# Patient Record
Sex: Female | Born: 1935 | Race: White | Hispanic: No | Marital: Single | Smoking: Former smoker
Health system: Southern US, Community
[De-identification: ages and names within clinical notes are randomized; demographics above are authoritative.]

## PROBLEM LIST (undated history)

## (undated) DIAGNOSIS — R32 Unspecified urinary incontinence: Secondary | ICD-10-CM

## (undated) DIAGNOSIS — M1990 Unspecified osteoarthritis, unspecified site: Secondary | ICD-10-CM

## (undated) DIAGNOSIS — G4700 Insomnia, unspecified: Secondary | ICD-10-CM

## (undated) DIAGNOSIS — J9610 Chronic respiratory failure, unspecified whether with hypoxia or hypercapnia: Secondary | ICD-10-CM

## (undated) DIAGNOSIS — G4762 Sleep related leg cramps: Secondary | ICD-10-CM

## (undated) DIAGNOSIS — J449 Chronic obstructive pulmonary disease, unspecified: Secondary | ICD-10-CM

## (undated) DIAGNOSIS — M6282 Rhabdomyolysis: Secondary | ICD-10-CM

## (undated) DIAGNOSIS — H409 Unspecified glaucoma: Secondary | ICD-10-CM

## (undated) DIAGNOSIS — J439 Emphysema, unspecified: Secondary | ICD-10-CM

## (undated) DIAGNOSIS — B019 Varicella without complication: Secondary | ICD-10-CM

## (undated) DIAGNOSIS — M549 Dorsalgia, unspecified: Secondary | ICD-10-CM

## (undated) HISTORY — DX: Unspecified osteoarthritis, unspecified site: M19.90

## (undated) HISTORY — DX: Emphysema, unspecified: J43.9

## (undated) HISTORY — DX: Rhabdomyolysis: M62.82

## (undated) HISTORY — DX: Unspecified urinary incontinence: R32

## (undated) HISTORY — DX: Sleep related leg cramps: G47.62

## (undated) HISTORY — DX: Unspecified glaucoma: H40.9

## (undated) HISTORY — DX: Insomnia, unspecified: G47.00

## (undated) HISTORY — DX: Varicella without complication: B01.9

## (undated) HISTORY — DX: Dorsalgia, unspecified: M54.9

## (undated) HISTORY — PX: COLOSTOMY: SHX63

## (undated) HISTORY — DX: Chronic obstructive pulmonary disease, unspecified: J44.9

---
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
## 1967-07-01 HISTORY — PX: BREAST SURGERY: SHX581

## 1973-06-30 HISTORY — PX: VAGINAL HYSTERECTOMY: SUR661

## 2004-05-04 ENCOUNTER — Emergency Department: Payer: Self-pay | Admitting: Emergency Medicine

## 2004-08-20 ENCOUNTER — Ambulatory Visit: Payer: Self-pay

## 2006-06-30 HISTORY — PX: HIP FRACTURE SURGERY: SHX118

## 2006-09-26 ENCOUNTER — Ambulatory Visit: Payer: Self-pay | Admitting: Family Medicine

## 2007-04-18 ENCOUNTER — Other Ambulatory Visit: Payer: Self-pay

## 2007-04-18 ENCOUNTER — Inpatient Hospital Stay: Payer: Self-pay | Admitting: Unknown Physician Specialty

## 2007-04-23 ENCOUNTER — Encounter: Payer: Self-pay | Admitting: Internal Medicine

## 2007-05-01 ENCOUNTER — Encounter: Payer: Self-pay | Admitting: Internal Medicine

## 2007-08-23 ENCOUNTER — Ambulatory Visit: Payer: Self-pay | Admitting: Pain Medicine

## 2007-08-31 ENCOUNTER — Ambulatory Visit: Payer: Self-pay | Admitting: Pain Medicine

## 2007-09-07 ENCOUNTER — Ambulatory Visit: Payer: Self-pay | Admitting: Pain Medicine

## 2007-09-20 ENCOUNTER — Ambulatory Visit: Payer: Self-pay | Admitting: Pain Medicine

## 2007-11-02 ENCOUNTER — Ambulatory Visit: Payer: Self-pay | Admitting: Pain Medicine

## 2007-11-29 ENCOUNTER — Ambulatory Visit: Payer: Self-pay | Admitting: Pain Medicine

## 2010-01-04 ENCOUNTER — Ambulatory Visit: Payer: Self-pay | Admitting: Family Medicine

## 2011-04-07 ENCOUNTER — Ambulatory Visit: Payer: Self-pay | Admitting: Family Medicine

## 2011-04-11 ENCOUNTER — Ambulatory Visit: Payer: Self-pay | Admitting: Family Medicine

## 2011-04-22 ENCOUNTER — Ambulatory Visit: Payer: Self-pay | Admitting: Surgery

## 2011-05-13 ENCOUNTER — Ambulatory Visit: Payer: Self-pay | Admitting: Unknown Physician Specialty

## 2011-05-20 ENCOUNTER — Ambulatory Visit: Payer: Self-pay | Admitting: Unknown Physician Specialty

## 2011-05-21 LAB — PATHOLOGY REPORT

## 2012-12-30 LAB — CBC AND DIFFERENTIAL
HEMATOCRIT: 41 % (ref 36–46)
HEMOGLOBIN: 13.5 g/dL (ref 12.0–16.0)
Neutrophils Absolute: 5 /uL
PLATELETS: 250 10*3/uL (ref 150–399)
WBC: 7.7 10*3/mL

## 2012-12-30 LAB — BASIC METABOLIC PANEL
BUN: 19 mg/dL (ref 4–21)
Creatinine: 0.7 mg/dL (ref 0.5–1.1)
GLUCOSE: 107 mg/dL
POTASSIUM: 4.3 mmol/L (ref 3.4–5.3)
Sodium: 142 mmol/L (ref 137–147)

## 2012-12-30 LAB — HEPATIC FUNCTION PANEL
ALT: 16 U/L (ref 7–35)
AST: 29 U/L (ref 13–35)
Alkaline Phosphatase: 75 U/L (ref 25–125)
BILIRUBIN, TOTAL: 0.3 mg/dL

## 2012-12-30 LAB — TSH: TSH: 3.21 u[IU]/mL (ref 0.41–5.90)

## 2013-03-23 ENCOUNTER — Inpatient Hospital Stay: Payer: Self-pay | Admitting: Internal Medicine

## 2013-03-23 LAB — CK TOTAL AND CKMB (NOT AT ARMC)
CK, Total: 117 U/L (ref 21–215)
CK-MB: 1.9 ng/mL (ref 0.5–3.6)

## 2013-03-23 LAB — BASIC METABOLIC PANEL
Anion Gap: 7 (ref 7–16)
BUN: 11 mg/dL (ref 7–18)
Chloride: 104 mmol/L (ref 98–107)
Co2: 25 mmol/L (ref 21–32)
Creatinine: 0.75 mg/dL (ref 0.60–1.30)
EGFR (African American): >60
EGFR (Non-African Amer.): >60
Glucose: 142 mg/dL — ABNORMAL HIGH (ref 65–99)
Potassium: 3.8 mmol/L (ref 3.5–5.1)

## 2013-03-23 LAB — CBC WITH DIFFERENTIAL/PLATELET
Basophil #: 0.1 10*3/uL (ref 0.0–0.1)
Eosinophil #: 0 10*3/uL (ref 0.0–0.7)
HCT: 41 % (ref 35.0–47.0)
HGB: 13.5 g/dL (ref 12.0–16.0)
MCHC: 32.9 g/dL (ref 32.0–36.0)
MCV: 92 fL (ref 80–100)
Monocyte %: 1.6 %
Neutrophil %: 93.1 %
Platelet: 272 10*3/uL (ref 150–440)
RDW: 14.4 % (ref 11.5–14.5)

## 2013-03-23 LAB — PRO B NATRIURETIC PEPTIDE: B-Type Natriuretic Peptide: 798 pg/mL — ABNORMAL HIGH (ref 0–450)

## 2013-03-23 LAB — TROPONIN I: Troponin-I: <0.02 ng/mL

## 2013-03-24 LAB — CBC WITH DIFFERENTIAL/PLATELET
Basophil #: 0 10*3/uL (ref 0.0–0.1)
Eosinophil #: 0 10*3/uL (ref 0.0–0.7)
HCT: 37.5 % (ref 35.0–47.0)
HGB: 12.8 g/dL (ref 12.0–16.0)
Lymphocyte #: 0.6 10*3/uL — ABNORMAL LOW (ref 1.0–3.6)
Lymphocyte %: 10.4 %
MCH: 30.7 pg (ref 26.0–34.0)
MCHC: 34 g/dL (ref 32.0–36.0)
MCV: 90 fL (ref 80–100)
Monocyte #: 0.2 x10 3/mm (ref 0.2–0.9)
Monocyte %: 3.4 %
Neutrophil %: 86.1 %
RDW: 14.3 % (ref 11.5–14.5)
WBC: 5.7 10*3/uL (ref 3.6–11.0)

## 2013-03-24 LAB — MAGNESIUM: Magnesium: 2 mg/dL

## 2013-03-28 LAB — CULTURE, BLOOD (SINGLE)

## 2013-11-07 ENCOUNTER — Institutional Professional Consult (permissible substitution): Payer: Self-pay | Admitting: Pulmonary Disease

## 2013-11-29 ENCOUNTER — Encounter: Payer: Self-pay | Admitting: Pulmonary Disease

## 2013-11-29 ENCOUNTER — Ambulatory Visit (INDEPENDENT_AMBULATORY_CARE_PROVIDER_SITE_OTHER): Payer: Commercial Managed Care - HMO | Admitting: Pulmonary Disease

## 2013-11-29 ENCOUNTER — Other Ambulatory Visit: Payer: Self-pay

## 2013-11-29 VITALS — BP 136/78 | HR 98 | Ht 62.0 in | Wt 122.0 lb

## 2013-11-29 DIAGNOSIS — R0602 Shortness of breath: Secondary | ICD-10-CM

## 2013-11-29 DIAGNOSIS — R0902 Hypoxemia: Secondary | ICD-10-CM

## 2013-11-29 DIAGNOSIS — J441 Chronic obstructive pulmonary disease with (acute) exacerbation: Secondary | ICD-10-CM

## 2013-11-29 DIAGNOSIS — J9611 Chronic respiratory failure with hypoxia: Secondary | ICD-10-CM | POA: Insufficient documentation

## 2013-11-29 DIAGNOSIS — J9610 Chronic respiratory failure, unspecified whether with hypoxia or hypercapnia: Secondary | ICD-10-CM

## 2013-11-29 DIAGNOSIS — J449 Chronic obstructive pulmonary disease, unspecified: Secondary | ICD-10-CM

## 2013-11-29 MED ORDER — ARFORMOTEROL TARTRATE 15 MCG/2ML IN NEBU: 15.0000 ug | INHALATION_SOLUTION | Freq: Two times a day (BID) | RESPIRATORY_TRACT | Status: DC

## 2013-11-29 NOTE — Assessment & Plan Note (Addendum)
COPD: GOLD Grade D Combined recommendations from the [employer], [employer], Designer, television/film set, European Respiratory Society (Qaseem A et al, Ann Intern Med. 2011;155(3):179) recommends tobacco cessation, pulmonary rehab (for symptomatic patients with an FEV1 < 50% predicted), supplemental oxygen (for patients with SaO2 <88% or paO2 <55), and appropriate bronchodilator therapy.  In regards to long acting bronchodilators, they recommend monotherapy (FEV1 60-80% with symptoms weak evidence, FEV1 with symptoms <60% strong evidence), or combination therapy (FEV1 <60% with symptoms, strong recommendation, moderate evidence).  One should also provide patients with annual immunizations and consider therapy for prevention of COPD exacerbations (ie. roflumilast or azithromycin) when appopriate.  -O2 therapy: advised to use 2L with exertion and qHS -Immunizations: Flu shot in fall -Tobacco use: quit 2003 -Exercise: pulmonary rehab referral -Bronchodilator therapy: We are limited by her glaucoma; will start with brovana alone, considering adding pulmicort depending on response to brovana -Exacerbation prevention: brovana, pulmonary rehab

## 2013-11-29 NOTE — Patient Instructions (Signed)
We will have [facility] bring out a nebulizer machine for you so you can take Brovana twice a day no matter how you feel Use your oxygen at 2L with exertion Use albuterol every four hours as needed We will refer you to pulmonary rehab at [facility] We will see you back in 2 months or sooner if needed

## 2013-11-29 NOTE — Progress Notes (Signed)
Subjective:    Patient ID: [name] [name], female    DOB: Jul 15, 1929, 80 y.o.   MRN: [id number]  HPI  This is a very pleasant 80 year old female who comes her clinic today for evaluation of COPD. She was recently diagnosed as COPD but she says she's been known that she had emphysema for several years. She smoked a pack a day of cigarettes for 50 years and quit in 2003. She has never had multiple episodes of bronchitis but she was admitted for COPD exacerbation earlier of this year. At that point she was discharged on Spiriva but she said that it didn't help her breathing and she was told that she should stay away from that and most inhalers because of her significant glaucoma. So because of that she's on been taking albuterol on a regular basis since then. She says that the albuterol will help her with shortness of breath but she continues to feel dyspneic with regular activities. She says that she can clean her house without too much trouble and she can carrying groceries but climbing a flight of stairs or pushing herself too fast will make her short of breath. She says that she has desire to go and do more but she is limited her activities because of her dyspnea. She does not cough she does not produce mucus. She has been staying in the house mostly because of the shortness of breath.  She was also prescribed oxygen not that long ago but she has not been using it regularly because she doesn't like it. She has been going around on room air and not using it at home. She's not sleeping on oxygen.  Past Medical History  Diagnosis Date  . Glaucoma   . COPD (chronic obstructive pulmonary disease)   . Nocturnal leg cramps   . Back pain   . Insomnia   . Osteoarthritis      Family History  Problem Relation Age of Onset  . Cancer Sister     cervical     History   Social History  . Marital Status: Single    Spouse Name: N/A    Number of Children: N/A  . Years of Education: N/A   Occupational  History  . Not on file.   Social History Main Topics  . Smoking status: Former Smoker -- 1.00 packs/day for 50 years    Types: Cigarettes    Quit date: 06/30/2001  . Smokeless tobacco: Never Used  . Alcohol Use: No  . Drug Use: No  . Sexual Activity: Not on file   Other Topics Concern  . Not on file   Social History Narrative  . No narrative on file     Allergies  Allergen Reactions  . Shellfish Allergy   . Codeine     headache     No outpatient prescriptions prior to visit.   No facility-administered medications prior to visit.      Review of Systems  Constitutional: Negative for fever and unexpected weight change.  HENT: Negative for congestion, dental problem, ear pain, nosebleeds, postnasal drip, rhinorrhea, sinus pressure, sneezing, sore throat and trouble swallowing.   Eyes: Negative for redness and itching.  Respiratory: Positive for shortness of breath and wheezing. Negative for cough and chest tightness.   Cardiovascular: Negative for palpitations and leg swelling.  Gastrointestinal: Negative for nausea and vomiting.  Genitourinary: Negative for dysuria.  Musculoskeletal: Negative for joint swelling.  Skin: Negative for rash.  Neurological: Negative for headaches.  Hematological: Does  not bruise/bleed easily.  Psychiatric/Behavioral: Negative for dysphoric mood. The patient is not nervous/anxious.        Objective:   Physical Exam Filed Vitals:   11/29/13 1354  BP: 136/78  Pulse: 98  Height: 5\' 2"  (1.575 m)  Weight: 122 lb (55.339 kg)  SpO2: 94%  RA at rest  Ambulated 500 feet on room air and oxygen saturation dropped to 81%  Gen: Thin, chronically ill-appearing, no acute distress HEENT: NCAT, PERRL, EOMi, OP clear, neck supple without masses PULM: Poor air movement, mild crackles right base CV: RRR, no mgr, no JVD AB: BS+, soft, nontender, no hsm Ext: warm, no edema, no clubbing, no cyanosis Derm: no rash or skin breakdown Neuro: A&Ox4,  CN II-XII intact, strength 5/5 in all 4 extremities  11/29/2013 simple spirometry ratio 40%, FEV1 0.45 L (26% predicted)     Assessment & Plan:   COPD (chronic obstructive pulmonary disease) COPD: GOLD Grade D Combined recommendations from the [employer], [employer], Designer, television/film set, European Respiratory Society (Qaseem A et al, Ann Intern Med. 2011;155(3):179) recommends tobacco cessation, pulmonary rehab (for symptomatic patients with an FEV1 < 50% predicted), supplemental oxygen (for patients with SaO2 <88% or paO2 <55), and appropriate bronchodilator therapy.  In regards to long acting bronchodilators, they recommend monotherapy (FEV1 60-80% with symptoms weak evidence, FEV1 with symptoms <60% strong evidence), or combination therapy (FEV1 <60% with symptoms, strong recommendation, moderate evidence).  One should also provide patients with annual immunizations and consider therapy for prevention of COPD exacerbations (ie. roflumilast or azithromycin) when appopriate.  -O2 therapy: advised to use 2L with exertion and qHS -Immunizations: Flu shot in fall -Tobacco use: quit 2003 -Exercise: pulmonary rehab referral -Bronchodilator therapy: We are limited by her glaucoma; will start with brovana alone, considering adding pulmicort depending on response to brovana -Exacerbation prevention: brovana, pulmonary rehab   Chronic hypoxemic respiratory failure Continue 2L with exertion, 2qHS  We'll need to get overnight oximetry testing    Updated Medication List Outpatient Encounter Prescriptions as of 11/29/2013  Medication Sig  . albuterol (PROVENTIL HFA;VENTOLIN HFA) 108 (90 BASE) MCG/ACT inhaler Inhale into the lungs every 6 (six) hours as needed for wheezing or shortness of breath.  . brinzolamide (AZOPT) 1 % ophthalmic suspension Place 1 drop into both eyes 3 (three) times daily.  . cholecalciferol (VITAMIN D) 1000 UNITS tablet Take  1,000 Units by mouth daily.  Marland Kitchen latanoprost (XALATAN) 0.005 % ophthalmic solution Place 1 drop into both eyes at bedtime.  . Multiple Vitamins-Minerals (CENTRUM SILVER ADULT 50+ PO) Take 1 tablet by mouth daily.

## 2013-11-29 NOTE — Assessment & Plan Note (Signed)
Continue 2L with exertion, 2qHS  We'll need to get overnight oximetry testing

## 2013-11-30 ENCOUNTER — Telehealth: Payer: Self-pay | Admitting: Pulmonary Disease

## 2013-11-30 NOTE — Telephone Encounter (Signed)
Spoke with BQ about this.  He states that [facility] is the only really option at this point.  Spoke with pt about this.  She is adamant about not working with [facility].  I advised her that this is our only real option aside from paying out of pocket for nebulizer and getting neb meds through local pharmacy, which is much more expensive.    Routing back to PCC's to see if they know of any local DME companies/pharmacies that we may have luck with.  She is planning on calling [facility] to have her 02 removed from her home, regardless of if we can find another company to cover this with her insurance.  Thank you.

## 2013-11-30 NOTE — Telephone Encounter (Signed)
Pt seen by BQ in [place] on 6.2.15 Orders sent to [facility] for portable/QHS oxygen and nebs thru [facility] per pt's specific request Called spoke with patient who is upset because [facility] has contacted her and brought her neb supplies to her home - she is refusing to receive care thru [facility] Per pt's chart, PCC's sent the order to [facility] d/t pt's [facility].  Pt became more upset stating that she will forego nebulizer and oxygen therapy if she has to get them thru this DME company.  Advised pt will forward this to BQ and see if there is another DME that will accept her insurance.  Spoke with Almyra Free who recommended to call [facility] and see if there is anyway they can work with patient.  Called [facility] and spoke with Iris.  Per Valli Glance is the only DME company contracted with [employer].  Iris did suggest pt getting her nebs thru her local pharmacy but this will be much more expensive for pt.  Dr Kendrick Fries please advise, thank you.

## 2013-11-30 NOTE — Telephone Encounter (Signed)
Spoke with the pharmacist at [facility] and gave dx code [id number]  Nothing further needed

## 2013-12-01 NOTE — Telephone Encounter (Signed)
After speaking to pt she seems to be struggling with the 02 when moving around she states it does not seem to be helping her,like not getting enough this seems to be the issue more so than the dme  What would you like to do ?

## 2013-12-07 NOTE — Telephone Encounter (Signed)
Pt called back-she is aware of recs from BQ listed below and states she is using her O2 and that [facility] just delivered her neb machine as well. Pt is okay using [facility] as DME. Rx should be shipped to patient from [facility] pharmacy-if she has any trouble she will contact our office. Nothing more needed at this time.

## 2013-12-07 NOTE — Telephone Encounter (Signed)
lmomtcb x1 

## 2013-12-07 NOTE — Telephone Encounter (Signed)
She is dyspnic because she has COPD and needs oxygen. Just because she wears oxygen doesn't mean she is not going to be short of breath.  If she is worse she should be seen sooner than we advised last visit

## 2013-12-13 ENCOUNTER — Telehealth: Payer: Self-pay | Admitting: Pulmonary Disease

## 2013-12-13 NOTE — Telephone Encounter (Signed)
ATC PT line busy wcb 

## 2013-12-14 MED ORDER — ARFORMOTEROL TARTRATE 15 MCG/2ML IN NEBU: 15.0000 ug | INHALATION_SOLUTION | Freq: Two times a day (BID) | RESPIRATORY_TRACT | Status: DC

## 2013-12-14 NOTE — Telephone Encounter (Signed)
lmtcb

## 2013-12-14 NOTE — Telephone Encounter (Signed)
Pt reports she is not going to get neb through DME. She [facility]. I have done so. Nothing further needed

## 2013-12-14 NOTE — Telephone Encounter (Signed)
Pt returned call

## 2014-03-02 ENCOUNTER — Encounter: Payer: Self-pay | Admitting: Pulmonary Disease

## 2014-03-02 ENCOUNTER — Ambulatory Visit (INDEPENDENT_AMBULATORY_CARE_PROVIDER_SITE_OTHER): Payer: Commercial Managed Care - HMO | Admitting: Pulmonary Disease

## 2014-03-02 ENCOUNTER — Other Ambulatory Visit: Payer: Self-pay

## 2014-03-02 VITALS — BP 126/68 | HR 91 | Ht 62.0 in | Wt 123.0 lb

## 2014-03-02 DIAGNOSIS — R0902 Hypoxemia: Secondary | ICD-10-CM

## 2014-03-02 DIAGNOSIS — J438 Other emphysema: Secondary | ICD-10-CM

## 2014-03-02 DIAGNOSIS — J9611 Chronic respiratory failure with hypoxia: Secondary | ICD-10-CM

## 2014-03-02 DIAGNOSIS — J9610 Chronic respiratory failure, unspecified whether with hypoxia or hypercapnia: Secondary | ICD-10-CM

## 2014-03-02 MED ORDER — FORMOTEROL FUMARATE 20 MCG/2ML IN NEBU: 20.0000 ug | INHALATION_SOLUTION | Freq: Two times a day (BID) | RESPIRATORY_TRACT | Status: DC

## 2014-03-02 MED ORDER — THEOPHYLLINE ER 100 MG PO TB12: 100.0000 mg | ORAL_TABLET | Freq: Two times a day (BID) | ORAL | Status: DC

## 2014-03-02 NOTE — Assessment & Plan Note (Signed)
See discussion above 

## 2014-03-02 NOTE — Progress Notes (Signed)
Subjective:    Patient ID: [name] [name], female    DOB: June 16, 1930, 80 y.o.   MRN: [id number]  Synopsis:: Gold grade D. COPD, 2015 FEV1 26% predicted  HPI  03/02/2014 ROV > [name] [name] says she has been better.  She continues to have wheezing. She is using her nebulizer twice per day and she is now using the brovana at 9a and 9p but she is still having problems.  She has not seen too much benefit from it.  She still feels quite dyspnic.  She finally has the oxygen but she refuses to use it when she goes out.  She is too embarrassed to go out with the oxygen. She says that it never really seems to help her much. She's not sure that the Rosalyn Gess is helping either. She uses albuterol at least every 4 hours. She does get some relief from it. Her vision has not changed since the last visit. She does not cough very much.  Past Medical History  Diagnosis Date  . Glaucoma   . COPD (chronic obstructive pulmonary disease)   . Nocturnal leg cramps   . Back pain   . Insomnia   . Osteoarthritis      Review of Systems  Constitutional: Positive for fatigue. Negative for fever and chills.  HENT: Negative for postnasal drip, rhinorrhea and sinus pressure.   Respiratory: Positive for shortness of breath. Negative for cough and wheezing.   Cardiovascular: Negative for chest pain, palpitations and leg swelling.       Objective:   Physical Exam Filed Vitals:   03/02/14 1003  BP: 126/68  Pulse: 91  Height:  (1.575 m)  Weight: 123 lb (55.792 kg)  SpO2: 94%   RA  Gen: well appearing, no acute distress HEENT: NCAT, EOMi, OP clear PULM: Poor air movement, no wheezing CV: RRR, systolic murmur noted, no JVD AB: BS+, soft, nontender Ext: warm, no edema, no clubbing, no cyanosis Derm: no rash or skin breakdown Neuro: A&Ox4, moves all extremities well       Assessment & Plan:   COPD (chronic obstructive pulmonary disease) Unfortunately Dauna remains quite symptomatic despite using oxygen and  the brovana.  This is due to the fact that she has very severe COPD and she is deconditioned. She would get some benefit if she would use the oxygen regularly on exertion but she refuses to do that. I counseled her today at length about this.  We are very limited in her choices because of her severe glaucoma. I do not feel that it would be safe to add anything to her current regimen in terms of a bronchodilator. However, I will prescribe theophylline as I feel we have very few other choices considering her severe disease.  Plan: -Start theophylline at low dose 100 mg twice a day, measure theophylline level 72 hours after starting and then again one week after that -Continue Brovana change prescription to come from a durable medical equipment company - I advised her at length that she needs to use oxygen when she exerts her self no questions asked -Go to pulmonary rehabilitation -Followup 2 months  Chronic hypoxemic respiratory failure See discussion above    Updated Medication List Outpatient Encounter Prescriptions as of 03/02/2014  Medication Sig  . albuterol (PROVENTIL HFA;VENTOLIN HFA) 108 (90 BASE) MCG/ACT inhaler Inhale into the lungs every 6 (six) hours as needed for wheezing or shortness of breath.  Marland Kitchen arformoterol (BROVANA) 15 MCG/2ML NEBU Take 2 mLs (15 mcg total) by  nebulization 2 (two) times daily.  . brinzolamide (AZOPT) 1 % ophthalmic suspension Place 1 drop into both eyes 3 (three) times daily.  . cholecalciferol (VITAMIN D) 1000 UNITS tablet Take 1,000 Units by mouth daily.  Marland Kitchen latanoprost (XALATAN) 0.005 % ophthalmic solution Place 1 drop into both eyes at bedtime.  . Multiple Vitamins-Minerals (CENTRUM SILVER ADULT 50+ PO) Take 1 tablet by mouth daily.  . theophylline (THEODUR) 100 MG 12 hr tablet Take 1 tablet (100 mg total) by mouth 2 (two) times daily.

## 2014-03-02 NOTE — Patient Instructions (Signed)
We will change your brovana prescription from [facility] to [facility] On Saturday September 5, start taking theophylline  by mouth twice a day Come in on Tuesday September 8 and again on September 15 for a theophylline level We will see you back in 3 months or sooner if needed

## 2014-03-02 NOTE — Assessment & Plan Note (Signed)
Unfortunately [name] [name] remains quite symptomatic despite using oxygen and the brovana.  This is due to the fact that she has very severe COPD and she is deconditioned. She would get some benefit if she would use the oxygen regularly on exertion but she refuses to do that. I counseled her today at length about this.  We are very limited in her choices because of her severe glaucoma. I do not feel that it would be safe to add anything to her current regimen in terms of a bronchodilator. However, I will prescribe theophylline as I feel we have very few other choices considering her severe disease.  Plan: -Start theophylline at low dose 100 mg twice a day, measure theophylline level 72 hours after starting and then again one week after that -Continue Brovana change prescription to come from a durable medical equipment company - I advised her at length that she needs to use oxygen when she exerts her self no questions asked -Go to pulmonary rehabilitation -Followup 2 months

## 2014-03-03 ENCOUNTER — Other Ambulatory Visit: Payer: Self-pay

## 2014-03-03 MED ORDER — FORMOTEROL FUMARATE 20 MCG/2ML IN NEBU: 20.0000 ug | INHALATION_SOLUTION | Freq: Two times a day (BID) | RESPIRATORY_TRACT | Status: DC

## 2014-03-07 ENCOUNTER — Telehealth: Payer: Self-pay | Admitting: Pulmonary Disease

## 2014-03-07 ENCOUNTER — Other Ambulatory Visit (INDEPENDENT_AMBULATORY_CARE_PROVIDER_SITE_OTHER): Payer: Commercial Managed Care - HMO

## 2014-03-07 DIAGNOSIS — J438 Other emphysema: Secondary | ICD-10-CM

## 2014-03-07 NOTE — Telephone Encounter (Signed)
Stop taking it

## 2014-03-07 NOTE — Telephone Encounter (Signed)
ATC line busy wcb 

## 2014-03-07 NOTE — Telephone Encounter (Signed)
BQ is working hospital. Please advise thanks

## 2014-03-07 NOTE — Telephone Encounter (Signed)
I called spoke with pt. Made her aware. Nothing further needed

## 2014-03-07 NOTE — Telephone Encounter (Signed)
Called spoke with pt. She reports the thoephylline is causing her to feel more SOB, dizzy, sweats. Pt reports she just doesn't;t feel herself since taking this medication.  BQ is 11 pm elink tonight. Please advise MW thanks  Allergies  Allergen Reactions  . Shellfish Allergy   . Codeine     headache     Current Outpatient Prescriptions on File Prior to Visit  Medication Sig Dispense Refill  . albuterol (PROVENTIL HFA;VENTOLIN HFA) 108 (90 BASE) MCG/ACT inhaler Inhale into the lungs every 6 (six) hours as needed for wheezing or shortness of breath.      Marland Kitchen arformoterol (BROVANA) 15 MCG/2ML NEBU Take 2 mLs (15 mcg total) by nebulization 2 (two) times daily.  360 mL  3  . brinzolamide (AZOPT) 1 % ophthalmic suspension Place 1 drop into both eyes 3 (three) times daily.      . cholecalciferol (VITAMIN D) 1000 UNITS tablet Take 1,000 Units by mouth daily.      . formoterol (PERFOROMIST) 20 MCG/2ML nebulizer solution Take 2 mLs (20 mcg total) by nebulization 2 (two) times daily.  360 mL  2  . latanoprost (XALATAN) 0.005 % ophthalmic solution Place 1 drop into both eyes at bedtime.      . Multiple Vitamins-Minerals (CENTRUM SILVER ADULT 50+ PO) Take 1 tablet by mouth daily.      . theophylline (THEODUR) 100 MG 12 hr tablet Take 1 tablet (100 mg total) by mouth 2 (two) times daily.  60 tablet  3   No current facility-administered medications on file prior to visit.

## 2014-03-08 LAB — THEOPHYLLINE LEVEL: Theophylline Lvl: 8.6 ug/mL — ABNORMAL LOW (ref 10.0–20.0)

## 2014-03-23 ENCOUNTER — Other Ambulatory Visit: Payer: Self-pay | Admitting: *Deleted

## 2014-03-23 MED ORDER — FORMOTEROL FUMARATE 20 MCG/2ML IN NEBU: 20.0000 ug | INHALATION_SOLUTION | Freq: Two times a day (BID) | RESPIRATORY_TRACT | Status: DC

## 2014-06-12 ENCOUNTER — Encounter: Payer: Self-pay | Admitting: Pulmonary Disease

## 2014-06-12 ENCOUNTER — Ambulatory Visit (INDEPENDENT_AMBULATORY_CARE_PROVIDER_SITE_OTHER): Payer: Commercial Managed Care - HMO | Admitting: Pulmonary Disease

## 2014-06-12 VITALS — BP 124/72 | HR 74 | Ht 62.0 in | Wt 124.0 lb

## 2014-06-12 DIAGNOSIS — Z23 Encounter for immunization: Secondary | ICD-10-CM

## 2014-06-12 DIAGNOSIS — J9611 Chronic respiratory failure with hypoxia: Secondary | ICD-10-CM

## 2014-06-12 DIAGNOSIS — R0602 Shortness of breath: Secondary | ICD-10-CM

## 2014-06-12 DIAGNOSIS — J439 Emphysema, unspecified: Secondary | ICD-10-CM

## 2014-06-12 MED ORDER — ALBUTEROL SULFATE 108 (90 BASE) MCG/ACT IN AEPB: 1.0000 | INHALATION_SPRAY | Freq: Four times a day (QID) | RESPIRATORY_TRACT | Status: DC | PRN

## 2014-06-12 NOTE — Assessment & Plan Note (Signed)
[name] [name] has been a very difficult patient to treat because of her noncompliance with oxygen. She is mostly frustrated with the company that provided her oxygen. I believe that her severe shortness of breath is do to her severe COPD which has a poor prognosis. However, I explained to her today that there is a differential diagnosis for her shortness of breath and she needs to have a more extensive workup to try to get to the bottom of her dyspnea. In the meantime I have asked her to today to use oxygen regularly and her albuterol as needed.  Plan: -Take Brovana twice a day -Use albuterol as needed -Stay active -Use oxygen with exertion -Full pulmonary function test -Chest x-ray -I will represcribe oxygen

## 2014-06-12 NOTE — Progress Notes (Signed)
Subjective:    Patient ID: [name] [name], female    DOB: 1930/01/27, 80 y.o.   MRN: [id number]  Synopsis:: Gold grade D. COPD, 2015 FEV1 26% predicted  HPI Chief Complaint  Patient presents with  . Follow-up    Patient c/o cough, sob, wheezing and chest tightness. After reviewing medications she never received Performist. Please discuss options as ins. will not cover Ventolin as well.   [name] [name] says that she has been having problems with her insurance coverage.  She is taking Brovana twice a day but she has not been using ventolin because her insurance won't pay for it.  She is switching her insurance company in January and she plans to switch to [employer].  She feels that her dyspnea has worsened since she saw me the last time.  She is not using oxygen at all and she sent it back to the company.  Past Medical History  Diagnosis Date  . Glaucoma   . COPD (chronic obstructive pulmonary disease)   . Nocturnal leg cramps   . Back pain   . Insomnia   . Osteoarthritis      Review of Systems  Constitutional: Positive for fatigue. Negative for fever and chills.  HENT: Negative for postnasal drip, rhinorrhea and sinus pressure.   Respiratory: Positive for cough and shortness of breath. Negative for wheezing.   Cardiovascular: Negative for chest pain, palpitations and leg swelling.       Objective:   Physical Exam Filed Vitals:   06/12/14 1121  BP: 124/72  Pulse: 74  Height: 5\' 2"  (1.575 m)  Weight: 124 lb (56.246 kg)  SpO2: 96%   RA  Gen: well appearing, no acute distress HEENT: NCAT, EOMi, OP clear PULM: Poor air movement, no wheezing CV: RRR, systolic murmur noted, no JVD AB: BS+, soft, nontender Ext: warm, no edema, no clubbing, no cyanosis Derm: no rash or skin breakdown Neuro: A&Ox4, moves all extremities well       Assessment & Plan:   COPD (chronic obstructive pulmonary disease) [name] [name] has been a very difficult patient to treat because of her  noncompliance with oxygen. She is mostly frustrated with the company that provided her oxygen. I believe that her severe shortness of breath is do to her severe COPD which has a poor prognosis. However, I explained to her today that there is a differential diagnosis for her shortness of breath and she needs to have a more extensive workup to try to get to the bottom of her dyspnea. In the meantime I have asked her to today to use oxygen regularly and her albuterol as needed.  Plan: -Take Brovana twice a day -Use albuterol as needed -Stay active -Use oxygen with exertion -Full pulmonary function test -Chest x-ray -I will represcribe oxygen  Chronic hypoxemic respiratory failure I have asked her just or using oxygen again. I will re-prescribe this through a new durable medical equipment company    Updated Medication List Outpatient Encounter Prescriptions as of 06/12/2014  Medication Sig  . Albuterol Sulfate (PROAIR RESPICLICK) 108 (90 BASE) MCG/ACT AEPB Inhale 1 puff into the lungs every 6 (six) hours as needed.  Marland Kitchen. arformoterol (BROVANA) 15 MCG/2ML NEBU Take 2 mLs (15 mcg total) by nebulization 2 (two) times daily.  Marland Kitchen. aspirin 325 MG tablet Take 325 mg by mouth every 6 (six) hours as needed.  . brinzolamide (AZOPT) 1 % ophthalmic suspension Place 1 drop into both eyes 3 (three) times daily.  . cholecalciferol (VITAMIN D)  1000 UNITS tablet Take 1,000 Units by mouth daily.  Marland Kitchen. latanoprost (XALATAN) 0.005 % ophthalmic solution Place 1 drop into both eyes at bedtime.  . Multiple Vitamins-Minerals (CENTRUM SILVER ADULT 50+ PO) Take 1 tablet by mouth daily.  . [DISCONTINUED] albuterol (PROVENTIL HFA;VENTOLIN HFA) 108 (90 BASE) MCG/ACT inhaler Inhale into the lungs every 6 (six) hours as needed for wheezing or shortness of breath.  . [DISCONTINUED] formoterol (PERFOROMIST) 20 MCG/2ML nebulizer solution Take 2 mLs (20 mcg total) by nebulization 2 (two) times daily. Dx 492.8 (Patient not taking:  Reported on 06/12/2014)  . [DISCONTINUED] theophylline (THEODUR) 100 MG 12 hr tablet Take 1 tablet (100 mg total) by mouth 2 (two) times daily. (Patient not taking: Reported on 06/12/2014)

## 2014-06-12 NOTE — Assessment & Plan Note (Signed)
I have asked her just or using oxygen again. I will re-prescribe this through a new durable medical equipment company

## 2014-06-12 NOTE — Patient Instructions (Signed)
Take the proAir respiclick instead of the ventolin as needed for shortness of breath We will arrange a Chest X-ray, pulmonary function test for you in January We will set up your oxygen and brovana with [facility] in January We will see you back in 4 weeks or sooner if needed

## 2014-06-27 ENCOUNTER — Telehealth: Payer: Self-pay | Admitting: Pulmonary Disease

## 2014-06-27 NOTE — Telephone Encounter (Signed)
Message not needed/spm °

## 2014-07-04 ENCOUNTER — Telehealth: Payer: Self-pay | Admitting: Pulmonary Disease

## 2014-07-04 ENCOUNTER — Other Ambulatory Visit: Payer: Self-pay | Admitting: Pulmonary Disease

## 2014-07-04 DIAGNOSIS — J439 Emphysema, unspecified: Secondary | ICD-10-CM

## 2014-07-04 DIAGNOSIS — J9611 Chronic respiratory failure with hypoxia: Secondary | ICD-10-CM

## 2014-07-04 MED ORDER — ARFORMOTEROL TARTRATE 15 MCG/2ML IN NEBU: 15.0000 ug | INHALATION_SOLUTION | Freq: Two times a day (BID) | RESPIRATORY_TRACT | Status: DC

## 2014-07-04 NOTE — Telephone Encounter (Signed)
Pt is needing her oxygen and nebulizer medications sent to [facility]. There is already an order in the system.  Her new pharmacy does not carry Brovana. She will need to be changed to something else.  BQ - please advise. Thanks.

## 2014-07-04 NOTE — Telephone Encounter (Signed)
Dr. Ulyses JarredMcQuaid's understanding was that Rosalyn GessBrovana was not covered by patient's ins. I explained that her local pharmacy does not carry Brovana and it is not an ins issue. Dr. Kendrick FriesMcQuaid is ok to continue with Brovana as prescibed. If anything else needs to be done please let me know.

## 2014-07-04 NOTE — Telephone Encounter (Signed)
perforomist bid 

## 2014-07-04 NOTE — Telephone Encounter (Signed)
Spoke with pt--aware of switch of medications from Brovana to Perforomist. Pt wanting to try [facility] to see if she can get these medications cheaper through them than she can a local or mail order pharmacy. Order is in system for O2, nebulizer and neb meds 07/04/14.  Will contact [facility] in the morning as I was unable to get in touch with a rep. Will hold in triage to follow up on in AM

## 2014-07-04 NOTE — Telephone Encounter (Signed)
Referral order for 02 copy of pt new ins card and sats, ov notes have been [facility] to [facility], spoke with Synetta FailAnita @ [facility] they have received., However the RX has not, Spoke with Duane LakeSonya @ the [place] office and asked that the signed RX be faxed to [facility] @[phone]. Marland Kitchen.Kandice HamsAlida Heavenly Christine

## 2014-07-05 NOTE — Telephone Encounter (Signed)
The signed brovana rx was signed by BQ and sent to alida's direct fax.  Was this received, and will this suffice?  Thanks!

## 2014-07-05 NOTE — Telephone Encounter (Signed)
Can you have BQ address this? Thanks!

## 2014-07-05 NOTE — Telephone Encounter (Signed)
[facility] just needs the Signed RX for whatever medication Dr Kendrick FriesMcQuaid and patient decide she is needing.  Will need a signed  rx so they can fill to complete this order. Please send to [facility] fax [phone], spoke with Synetta FailAnita @ [facility] they still have not received signed rx.  Thanks!

## 2014-07-06 DIAGNOSIS — J9611 Chronic respiratory failure with hypoxia: Secondary | ICD-10-CM | POA: Diagnosis not present

## 2014-07-06 DIAGNOSIS — J439 Emphysema, unspecified: Secondary | ICD-10-CM | POA: Diagnosis not present

## 2014-07-06 NOTE — Telephone Encounter (Signed)
Signed rx for [name] GessBrovana [facility] and they have received it. Kandice Hams.Alexandre Lightsey

## 2014-07-12 ENCOUNTER — Telehealth: Payer: Self-pay | Admitting: Pulmonary Disease

## 2014-07-12 NOTE — Telephone Encounter (Signed)
Returning call.

## 2014-07-12 NOTE — Telephone Encounter (Signed)
I haven't called her.  The only thing I can think of is if she is checking on the status on her inhaled medications, which were sent to [facility] and not [facility].  I have not contacted the patient since her last office visit.

## 2014-07-12 NOTE — Telephone Encounter (Signed)
Called and spoke to pt. Pt stated since she has changed DME companies from [facility] to [facility]. Pt stated she has two nebulizer machines, one is [facility]'s and the other is [facility]'s. Called and spoke to [facility] and they stated the nebulizer that was provided by [facility] is now " patient owned". Called and spoke to pt. Informed pt that the nebulizer machine that was provided by [facility] is now hers to keep. Pt verbalized understanding and denied any further questions or concerns at this time.

## 2014-07-12 NOTE — Telephone Encounter (Signed)
LM to return call  Morrie Sheldonshley please advise what you were trying to get in touch with pt about. I do not see anything in EPIC

## 2014-07-18 DIAGNOSIS — H4011X3 Primary open-angle glaucoma, severe stage: Secondary | ICD-10-CM | POA: Diagnosis not present

## 2014-08-02 ENCOUNTER — Encounter: Payer: Self-pay | Admitting: Pulmonary Disease

## 2014-08-02 ENCOUNTER — Ambulatory Visit (INDEPENDENT_AMBULATORY_CARE_PROVIDER_SITE_OTHER): Payer: Medicare Other | Admitting: Pulmonary Disease

## 2014-08-02 VITALS — BP 128/72 | HR 129 | Ht 62.0 in | Wt 124.0 lb

## 2014-08-02 DIAGNOSIS — J439 Emphysema, unspecified: Secondary | ICD-10-CM | POA: Diagnosis not present

## 2014-08-02 MED ORDER — ALBUTEROL SULFATE 108 (90 BASE) MCG/ACT IN AEPB: 1.0000 | INHALATION_SPRAY | Freq: Four times a day (QID) | RESPIRATORY_TRACT | Status: DC | PRN

## 2014-08-02 MED ORDER — HYDROCODONE-ACETAMINOPHEN 7.5-500 MG PO TABS: 1.0000 | ORAL_TABLET | Freq: Four times a day (QID) | ORAL | Status: DC | PRN

## 2014-08-02 NOTE — Patient Instructions (Signed)
Take the vicodin for the shortness of breath Use your oxygen regularly at night an on exertion We will call you with the results of the chest x-ray We will see you back in 4-6 weeks or sooner if needed

## 2014-08-02 NOTE — Progress Notes (Signed)
Subjective:    Patient ID: [name] [name], female    DOB: 1930-02-22, 81 y.o.   MRN: [id number]  Synopsis:: Gold grade D. COPD, 2015 FEV1 26% predicted  HPI Chief Complaint  Patient presents with  . Follow-up    pt c/o increased sob, wheezing with exertion.  States her nebulizers do not help this.  Sometimes prod cough with clear mucus, runny nose, pnd.  Pt has not had cxr or pft since last visit.    [name] [name] is still frustrated because of ongoing dyspnea.  She says that she has been using the oxygen more lately and it has not helped with her dyspnea.  She has been using the nebulizer twice a day.  She takes the ventolin but it doesn't help.  She is limited in her ability to leave the house by debilitating dyspnea.  She continues to use the brovana.  She denies cough, chest pain, or mucus production.  Past Medical History  Diagnosis Date  . Glaucoma   . COPD (chronic obstructive pulmonary disease)   . Nocturnal leg cramps   . Back pain   . Insomnia   . Osteoarthritis      Review of Systems  Constitutional: Positive for fatigue. Negative for fever and chills.  HENT: Negative for postnasal drip, rhinorrhea and sinus pressure.   Respiratory: Positive for shortness of breath. Negative for cough and wheezing.   Cardiovascular: Negative for chest pain, palpitations and leg swelling.       Objective:   Physical Exam Filed Vitals:   08/02/14 1104  BP: 128/72  Pulse: 129  Height:  (1.575 m)  Weight: 124 lb (56.246 kg)  SpO2: 97%   RA  Gen: well appearing, no acute distress HEENT: NCAT, EOMi, OP clear PULM: Poor air movement, no wheezing CV: RRR, systolic murmur noted, no JVD AB: BS+, soft, nontender Ext: warm, no edema, no clubbing, no cyanosis Derm: no rash or skin breakdown Neuro: A&Ox4, moves all extremities well       Assessment & Plan:   COPD (chronic obstructive pulmonary disease) [name] [name] is frustrated by the severity of her dyspnea.  However, we are very  limited by our treatment options because of her severe glaucoma.  Further, she has been non-compliant with oxygen therapy for years.  I explained to her today that her Spirometry showed very severe airflow obstruction so I am not surprised by her degree of dyspnea.  I have recommended that she continue exercising as much as possible and consider opiates to relieve air hunger. She was initially resistant to this idea but after I explained that it might actually make her more functional she was agreeable.  She says she has a morphine allergy.  Plan: -try vicodin prn dyspnea -continue brovana -continue albuterol prn -educated at length to use oxygen with exertion     Updated Medication List Outpatient Encounter Prescriptions as of 08/02/2014  Medication Sig  . Albuterol Sulfate (VENTOLIN HFA IN) Inhale 2 puffs into the lungs every 6 (six) hours as needed.  Marland Kitchen arformoterol (BROVANA) 15 MCG/2ML NEBU Take 2 mLs (15 mcg total) by nebulization 2 (two) times daily.  . brinzolamide (AZOPT) 1 % ophthalmic suspension Place 1 drop into both eyes 3 (three) times daily.  . cholecalciferol (VITAMIN D) 1000 UNITS tablet Take 1,000 Units by mouth daily.  Marland Kitchen latanoprost (XALATAN) 0.005 % ophthalmic solution Place 1 drop into both eyes at bedtime.  . Multiple Vitamins-Minerals (CENTRUM SILVER ADULT 50+ PO) Take 1 tablet by  mouth daily.  . [DISCONTINUED] aspirin 325 MG tablet Take 325 mg by mouth every 6 (six) hours as needed.  . Albuterol Sulfate (PROAIR RESPICLICK) 108 (90 BASE) MCG/ACT AEPB Inhale 1 puff into the lungs every 6 (six) hours as needed (shortness of breath).  Marland Kitchen. HYDROcodone-acetaminophen (LORTAB 7.5) 7.5-500 MG per tablet Take 1 tablet by mouth every 6 (six) hours as needed (shortness of breath).  . [DISCONTINUED] Albuterol Sulfate (PROAIR RESPICLICK) 108 (90 BASE) MCG/ACT AEPB Inhale 1 puff into the lungs every 6 (six) hours as needed. (Patient not taking: Reported on 08/02/2014)

## 2014-08-03 NOTE — Assessment & Plan Note (Signed)
[name] [name] is frustrated by the severity of her dyspnea.  However, we are very limited by our treatment options because of her severe glaucoma.  Further, she has been non-compliant with oxygen therapy for years.  I explained to her today that her Spirometry showed very severe airflow obstruction so I am not surprised by her degree of dyspnea.  I have recommended that she continue exercising as much as possible and consider opiates to relieve air hunger. She was initially resistant to this idea but after I explained that it might actually make her more functional she was agreeable.  She says she has a morphine allergy.  Plan: -try vicodin prn dyspnea -continue brovana -continue albuterol prn -educated at length to use oxygen with exertion

## 2014-08-04 ENCOUNTER — Ambulatory Visit: Payer: Self-pay | Admitting: Pulmonary Disease

## 2014-08-04 DIAGNOSIS — J449 Chronic obstructive pulmonary disease, unspecified: Secondary | ICD-10-CM | POA: Diagnosis not present

## 2014-08-06 DIAGNOSIS — J439 Emphysema, unspecified: Secondary | ICD-10-CM | POA: Diagnosis not present

## 2014-08-06 DIAGNOSIS — J9611 Chronic respiratory failure with hypoxia: Secondary | ICD-10-CM | POA: Diagnosis not present

## 2014-08-07 ENCOUNTER — Telehealth: Payer: Self-pay | Admitting: Pulmonary Disease

## 2014-08-07 MED ORDER — HYDROCODONE-ACETAMINOPHEN 7.5-325 MG PO TABS: 1.0000 | ORAL_TABLET | Freq: Four times a day (QID) | ORAL | Status: DC | PRN

## 2014-08-07 NOTE — Telephone Encounter (Signed)
OK by me 

## 2014-08-07 NOTE — Telephone Encounter (Signed)
Call[facility] and [facility]. Rx changed from Lortab 7.5-500 to Lortab 7.5-325. Nothing further needed.

## 2014-08-07 NOTE — Telephone Encounter (Signed)
Called and spoke to pharmacist. She stated the Lortab 7.5-500 is no longer available but the Lortab 7.5-325 is available.   BQ please advise on med change. Thanks.

## 2014-08-07 NOTE — Telephone Encounter (Signed)
Spoke with patient-states she wanted to know if she had refills on her Brovana left as she has had a hard time with [facility]. Pt is aware that 06-2014 Rx was sent to [facility] with refills and she needs to contact the local branch for [facility] and they are to assist her with the pharmacy situation. Nothing more needed at this time.

## 2014-08-08 DIAGNOSIS — J439 Emphysema, unspecified: Secondary | ICD-10-CM | POA: Diagnosis not present

## 2014-08-14 ENCOUNTER — Telehealth: Payer: Self-pay

## 2014-08-14 NOTE — Telephone Encounter (Signed)
-----   Message from Lupita Leashouglas B McQuaid, MD sent at 08/12/2014  7:10 AM EST ----- A, Please let her know that her CXR was normal aside from emphysema Thanks B

## 2014-08-14 NOTE — Telephone Encounter (Signed)
Pt aware of results.  Nothing further needed.  

## 2014-08-31 ENCOUNTER — Encounter: Payer: Self-pay | Admitting: Pulmonary Disease

## 2014-09-04 DIAGNOSIS — J439 Emphysema, unspecified: Secondary | ICD-10-CM | POA: Diagnosis not present

## 2014-09-04 DIAGNOSIS — J9611 Chronic respiratory failure with hypoxia: Secondary | ICD-10-CM | POA: Diagnosis not present

## 2014-09-14 DIAGNOSIS — J439 Emphysema, unspecified: Secondary | ICD-10-CM | POA: Diagnosis not present

## 2014-09-19 ENCOUNTER — Ambulatory Visit (INDEPENDENT_AMBULATORY_CARE_PROVIDER_SITE_OTHER): Payer: Medicare Other | Admitting: Pulmonary Disease

## 2014-09-19 ENCOUNTER — Encounter: Payer: Self-pay | Admitting: Pulmonary Disease

## 2014-09-19 ENCOUNTER — Other Ambulatory Visit: Payer: Self-pay | Admitting: *Deleted

## 2014-09-19 VITALS — BP 128/72 | HR 87 | Ht 62.0 in | Wt 123.0 lb

## 2014-09-19 DIAGNOSIS — J449 Chronic obstructive pulmonary disease, unspecified: Secondary | ICD-10-CM | POA: Diagnosis not present

## 2014-09-19 DIAGNOSIS — J9611 Chronic respiratory failure with hypoxia: Secondary | ICD-10-CM

## 2014-09-19 MED ORDER — ALBUTEROL SULFATE (2.5 MG/3ML) 0.083% IN NEBU: 2.5000 mg | INHALATION_SOLUTION | Freq: Four times a day (QID) | RESPIRATORY_TRACT | Status: DC | PRN

## 2014-09-19 MED ORDER — ARFORMOTEROL TARTRATE 15 MCG/2ML IN NEBU: 15.0000 ug | INHALATION_SOLUTION | Freq: Two times a day (BID) | RESPIRATORY_TRACT | Status: DC

## 2014-09-19 NOTE — Progress Notes (Signed)
Subjective:    Patient ID: [name] [name], female    DOB: 1929/09/13, 81 y.o.   MRN: [id number]  Synopsis:: Gold grade D. COPD, 2015 FEV1 26% predicted  HPI Chief Complaint  Patient presents with  . Follow-up    pt c/o worsening sob with any exertion.  Having to use a cane with ambulation.  Pt never picked up vicodin from pharmacy.     [name] did not take the vicodin because had an argument with the pharmacy. She is using the oxygen given by [facility] and it really helps.  She would like a portable oxygen concentrator. She takes the [place] and it helps.   Past Medical History  Diagnosis Date  . Glaucoma   . COPD (chronic obstructive pulmonary disease)   . Nocturnal leg cramps   . Back pain   . Insomnia   . Osteoarthritis      Review of Systems  Constitutional: Positive for fatigue. Negative for fever and chills.  HENT: Negative for postnasal drip, rhinorrhea and sinus pressure.   Respiratory: Positive for shortness of breath. Negative for cough and wheezing.   Cardiovascular: Negative for chest pain, palpitations and leg swelling.       Objective:   Physical Exam Filed Vitals:   09/19/14 1134  BP: 128/72  Pulse: 87  Height: 5\' 2"  (1.575 m)  Weight: 123 lb (55.792 kg)  SpO2: 96%   RA  Gen: well appearing, no acute distress HEENT: NCAT, EOMi, OP clear PULM: Poor air movement, no wheezing CV: RRR, systolic murmur noted, no JVD AB: BS+, soft, nontender Ext: warm, no edema, no clubbing, no cyanosis Derm: no rash or skin breakdown Neuro: A&Ox4, moves all extremities well       Assessment & Plan:   COPD (chronic obstructive pulmonary disease) This has been a relatively stable interval but asked her remains very severely limited by her very severe COPD. She refuses to take narcotics. It is difficult to convince her to take any medications for that matter. She does seem to benefit from the nebulized medications. We are limited by her glaucoma.  Plan:    -continue Bivona twice a day -I encouraged her to use albuterol nebulizers every 6 hours as needed for shortness of breath   Chronic hypoxemic respiratory failure Continue 2 L of oxygen with exertion and daily at bedtime. She is using and benefiting from her oxygen. She would like a portable oxygen concentrator.   Plan:  -we will order a portable oxygen concentrator.     Updated Medication List Outpatient Encounter Prescriptions as of 09/19/2014  Medication Sig  . albuterol (PROVENTIL) (2.5 MG/3ML) 0.083% nebulizer solution Take 3 mLs (2.5 mg total) by nebulization every 6 (six) hours as needed for wheezing or shortness of breath.  . Albuterol Sulfate (PROAIR RESPICLICK) 108 (90 BASE) MCG/ACT AEPB Inhale 1 puff into the lungs every 6 (six) hours as needed (shortness of breath).  . Albuterol Sulfate (VENTOLIN HFA IN) Inhale 2 puffs into the lungs every 6 (six) hours as needed.  Marland Kitchen. arformoterol (BROVANA) 15 MCG/2ML NEBU Take 2 mLs (15 mcg total) by nebulization 2 (two) times daily.  . brinzolamide (AZOPT) 1 % ophthalmic suspension Place 1 drop into both eyes 3 (three) times daily.  . cholecalciferol (VITAMIN D) 1000 UNITS tablet Take 1,000 Units by mouth daily.  Marland Kitchen. latanoprost (XALATAN) 0.005 % ophthalmic solution Place 1 drop into both eyes at bedtime.  . Multiple Vitamins-Minerals (CENTRUM SILVER ADULT 50+ PO) Take 1 tablet by  mouth daily.  . [DISCONTINUED] albuterol (PROVENTIL) (2.5 MG/3ML) 0.083% nebulizer solution Take 3 mLs (2.5 mg total) by nebulization every 6 (six) hours as needed for wheezing or shortness of breath.  . [DISCONTINUED] arformoterol (BROVANA) 15 MCG/2ML NEBU Take 2 mLs (15 mcg total) by nebulization 2 (two) times daily.  . [DISCONTINUED] HYDROcodone-acetaminophen (LORTAB) 7.5-325 MG per tablet Take 1 tablet by mouth every 6 (six) hours as needed (for shortness of breath).

## 2014-09-19 NOTE — Assessment & Plan Note (Signed)
Continue 2 L of oxygen with exertion and daily at bedtime. She is using and benefiting from her oxygen. She would like a portable oxygen concentrator.   Plan:  -we will order a portable oxygen concentrator.

## 2014-09-19 NOTE — Patient Instructions (Signed)
We will arrange a portable oxygen concentrator through [facility] and we will arrange albuterol nebulizer to use as needed Take the brovana twice a day We will see you back in 3 months or sooner if neede

## 2014-09-19 NOTE — Assessment & Plan Note (Signed)
This has been a relatively stable interval but asked her remains very severely limited by her very severe COPD. She refuses to take narcotics. It is difficult to convince her to take any medications for that matter. She does seem to benefit from the nebulized medications. We are limited by her glaucoma.  Plan:  -continue Bivona twice a day -I encouraged her to use albuterol nebulizers every 6 hours as needed for shortness of breath

## 2014-09-27 ENCOUNTER — Telehealth: Payer: Self-pay | Admitting: Pulmonary Disease

## 2014-09-27 NOTE — Telephone Encounter (Signed)
Spoke with pharmacist, Diane, at [facility]  [facility] prepared rx for albuterol nebs for pt and it was worded 1 daily as needed and as needed  BQ signed the rx, but they need wording to be changed to daily as needed I gave VO for this  Nothing further needed

## 2014-09-27 NOTE — Telephone Encounter (Signed)
Spoke with Alyce with [facility].  When order was sent to them in 06/2014 it was for oxygen with exertion and night time.  Pt refused the portable tanks at this time and only wanted the concentrators.  To be evaluated for a POC pt will need a new record of walking qualifying sats.  Pt given appt in [place] office to have qualifying sats checked and need to be sent to [facility].  Also Alyce with [facility] states that pt should be receiving Albuterol neb in next few days.

## 2014-09-28 DIAGNOSIS — J449 Chronic obstructive pulmonary disease, unspecified: Secondary | ICD-10-CM | POA: Diagnosis not present

## 2014-10-02 ENCOUNTER — Ambulatory Visit (INDEPENDENT_AMBULATORY_CARE_PROVIDER_SITE_OTHER): Payer: Medicare Other | Admitting: Pulmonary Disease

## 2014-10-02 DIAGNOSIS — J439 Emphysema, unspecified: Secondary | ICD-10-CM | POA: Diagnosis not present

## 2014-10-05 ENCOUNTER — Telehealth: Payer: Self-pay | Admitting: Pulmonary Disease

## 2014-10-05 DIAGNOSIS — J9611 Chronic respiratory failure with hypoxia: Secondary | ICD-10-CM | POA: Diagnosis not present

## 2014-10-05 DIAGNOSIS — J439 Emphysema, unspecified: Secondary | ICD-10-CM | POA: Diagnosis not present

## 2014-10-05 NOTE — Telephone Encounter (Signed)
Called spoke with Robynn PaneElise from [facility]. RT did care check with pt. Was told she had walk test done on 10/02/14. Looking in her chart she had 6mw done in [place]. Elise aware BQ not in office. Will forward to Dr. Kendrick FriesMcQuaid so he is aware.

## 2014-10-06 NOTE — Telephone Encounter (Signed)
lmtcb x1 for [facility]. 

## 2014-10-06 NOTE — Telephone Encounter (Signed)
6MW today justified O2,  [name] [name] has been hesitant to use oxygen in the past due to cost, not wanting to use it etc.  However in the last visit she was willing to try it.  Please order 2 L with exertion

## 2014-10-09 NOTE — Telephone Encounter (Signed)
Attempted to call [facility]. Line was busy.

## 2014-10-10 NOTE — Telephone Encounter (Signed)
lmtcb X1 for [facility] through answering service.

## 2014-10-11 DIAGNOSIS — J449 Chronic obstructive pulmonary disease, unspecified: Secondary | ICD-10-CM | POA: Diagnosis not present

## 2014-10-11 NOTE — Telephone Encounter (Signed)
Order for o2 sent to [facility] per Dr Ulyses JarredMcQuaid's response below

## 2014-10-12 ENCOUNTER — Telehealth: Payer: Self-pay | Admitting: Pulmonary Disease

## 2014-10-12 NOTE — Telephone Encounter (Signed)
Spoke with Angelica ChessmanMandy at [facility], states that she needs a copy of pt's 6mw from 4/4 to have written "on room air at rest" at pt's initial 86% SP02 reading and faxed to her.  This has been done.  Nothing further needed.

## 2014-10-13 DIAGNOSIS — J439 Emphysema, unspecified: Secondary | ICD-10-CM | POA: Diagnosis not present

## 2014-10-13 DIAGNOSIS — J9611 Chronic respiratory failure with hypoxia: Secondary | ICD-10-CM | POA: Diagnosis not present

## 2014-10-16 ENCOUNTER — Telehealth: Payer: Self-pay | Admitting: Pulmonary Disease

## 2014-10-16 NOTE — Telephone Encounter (Signed)
Mandy [facility] called and says that Resp therapist did eval on pt, she is sob & has trouble sleeping. Using inhaler. Potential candidate for NHV?  BQ - please advise.

## 2014-10-17 NOTE — Telephone Encounter (Signed)
Spoke with Angelica ChessmanMandy and notified of BQ's response Nothing further needed

## 2014-10-17 NOTE — Telephone Encounter (Signed)
NO

## 2014-10-20 NOTE — H&P (Signed)
PATIENT NAME:  [name] [name], [name] [name] MR#:  [id number] DATE OF BIRTH:  1930-01-22  DATE OF ADMISSION:  03/23/2013  REFERRING PHYSICIAN: Dr. Dorothea GlassmanPaul Malinda.   PRIMARY CARE PHYSICIAN: Dr. Lorie PhenixNancy Maloney.   CHIEF COMPLAINT: Shortness of breath.   HISTORY OF PRESENT ILLNESS: An 81 year old female who was an ex-smoker, stopped smoking 15 years ago, had some shortness of breath which is worsening for the last few weeks. She was using Ventolin inhaler. Initially it was giving her relief for 2 to 3 hours and then she had to use it again and gradually she started feeling very short of breath, even in her day-to-day activities and she has to keep on using her Ventolin inhaler very frequently but still was not giving enough relief so she went to see Dr. Santiago BurMaloney's office yesterday and her PA gave her oral prednisone and some antibiotic which she took 1 dose.  She felt a little better last night but today morning, when she woke up, she started feeling again very short of breath. She had an appointment with Dr. Santiago BurMaloney's office, went over there and she was sent to the Emergency Room because of persistent shortness of breath. On further questioning, the patient denies any fever and she has dry cough, not any sick contacts or no sputum production. Does not use any oxygen at home but was getting excessively short of breath and was not even able to walk in the house to finish her day-to-day activities for the last few days.   REVIEW OF SYSTEMS CONSTITUTIONAL: Negative for fever, fatigue, weakness, pain or weight loss.  EYES: No blurring, double vision or discharge from the eye.  EARS, NOSE, THROAT: No tinnitus, ear pain or hearing loss.  RESPIRATORY: No cough but has severe shortness of breath. No chest pain.  CARDIOVASCULAR: No chest pain, orthopnea, edema or palpitations.  GASTROINTESTINAL: No nausea, vomiting, diarrhea or abdominal pain.  GENITOURINARY: No dysuria, hematuria or increased frequency.  ENDOCRINE: No increased  sweating. No heat or cold intolerance.  SKIN: No acne or rashes or lesions on the skin.  MUSCULOSKELETAL: No pain or swelling in the joints.  NEUROLOGICAL: No numbness, weakness, tremors or vertigo.  PSYCHIATRIC: No anxiety, insomnia, bipolar disorder.   PAST MEDICAL HISTORY: Glaucoma and COPD.    PAST SURGICAL HISTORY: Hysterectomy 30 years ago, some back surgery because of bulging discs 2 to 3 years ago and 7 years ago had hip fracture and had pinning in her hip joint.   HOME MEDICATIONS: Ventolin inhaler as needed, Azopt ophthalmic drops 1 drop both eyes 3 times a day and Latanoprost 0.005% ophthalmic drops 1 drop each eye at bedtime.   SOCIAL HISTORY: Lives alone at home and was able to perform her day-to-day activities till a  few weeks ago before she started getting short of breath. Now it is difficult for to finish her day-to-day activities because of excessive shortness of breath.  She does not use any support to walk around. She was a smoker, smoking almost 10 to 15 cigarettes a day in the past for many years but stopped smoking 15 years ago. Denies alcohol or doing any drugs. 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   FAMILY HISTORY: Positive for uterine cancer in her sister and brain bleeding in her father.   PHYSICAL EXAMINATION VITAL SIGNS: In ER, temperature 99.2, pulse rate 76, respirations 24, blood pressure 140/66 and pulse ox 90% on 2 liters oxygen supplementation.  GENERAL: The patient is fully alert and  oriented, appears slightly anxious. No acute distress.  HEENT: Head and neck atraumatic. Conjunctivae pink. Oral mucosa moist.  NECK: Supple. No JVD.  RESPIRATORY: Bilateral equal air entry, few wheezing present, overall decreased air sounds bilaterally, possible underlying lung problems or severe emphysema.  CARDIOVASCULAR: S1, S2 present, regular. No murmur.  ABDOMEN: Soft, nontender. Bowel sounds present. No organomegaly.  SKIN: No rashes.  LEGS: No edema.   NEUROLOGICAL: Power 5 out of 5. Follows commands. No gross abnormalities.  JOINTS: No swelling or tenderness.  PSYCHIATRIC: Appears mildly anxious. No gross other abnormality.   IMPORTANT LABORATORY RESULTS: Glucose 142. BNP 798. BUN 11, creatinine 0.75, sodium 136, potassium 3.8, chloride 104, CO2 25, calcium 9.5. Troponin less than 0.02. WBC 10.4, hemoglobin 13.5, platelet count 272 and MCV 92. Chest x-ray, portable, shows no acute cardiopulmonary disease. EKG in the ER shows sinus tachycardia on presentation but later on it slowed down.   ASSESSMENT AND PLAN: An 81 year old female with past history of glaucoma and chronic smoking in the past, having worsening shortness of breath for the last few weeks, came to the Emergency Room.   1. Acute respiratory failure. Currently, she is needing supplemental oxygen to maintain oxygen saturation up to 90%. This is secondary to chronic obstructive pulmonary disease exacerbation and underlying lung problems due to smoking. We will treat the underlying cause and we will continue giving supplemental oxygen.  2.  Chronic obstructive pulmonary disease exacerbation. Will give IV Solu-Medrol, nebulizer treatment and we will also start on Spiriva and Advair for long-term better management after stopping steroids. Will also give Levaquin IV to control any infection.  3.  Glaucoma. Will continue her eyedrops which she was taking at home.  4.  CODE STATUS: Full code.   TOTAL SPENT IN THIS ADMISSION: 50 minutes.    ____________________________ Hope Pigeon Elisabeth Pigeon, MD vgv:cs D: 03/23/2013 15:13:28 ET T: 03/23/2013 15:31:10 ET JOB#: [id number]  cc: Hope Pigeon. Elisabeth Pigeon, MD, <Dictator> Leo Grosser, MD Altamese Dilling MD ELECTRONICALLY SIGNED 03/25/2013 18:38

## 2014-10-20 NOTE — Discharge Summary (Signed)
PATIENT NAME:  [name] [name], [name] [name] MR#:  [id number] DATE OF BIRTH:  [birth year]  ADMITTING DIAGNOSES: Chronic obstructive pulmonary disease exacerbation.   DISCHARGE DIAGNOSES:  1.  Acute respiratory failure.  2.  Chronic obstructive pulmonary disease exacerbation.  3.  Bronchitis.  4.  Hyperglycemia due to stress as well as steroids.  5.  Elevated D-dimer with negative ultrasound of lower extremities for deep vein thrombosis.  6.  History of glaucoma.   DISCHARGE CONDITION: Stable.   DISCHARGE MEDICATIONS:  The patient is to continue: 1.  Azopt, 1% ophthalmic suspension, 1 drop to both eyes 3 times daily.  2.  Latanoprost 0.005% ophthalmic solution one drop to both eyes once at bedtime.  3.  Ventolin HFA 2 puffs every 4 hours as needed.  4.  Vitamin D 1000 units once daily.  5.  Multivitamins 1 daily.  6.  Prednisone taper at (Dictation Anomaly)50  mg p.o. once on the 27th of September 2014, then taper by 10 mg daily until stopped.  7.  Fluticasone salmeterol 250/50 one puff twice daily.  8.  Tiotropium 1 inhalation once daily.  9.  Levaquin 500 mg p.o. daily for 5 more days.     HOME OXYGEN: None.   DIET: Regular. Regular consistency.   ACTIVITY LIMITATIONS: As tolerated.   FOLLOWUP APPOINTMENT: With Dr. Elease Hashimoto in 2 days after discharge.    CONSULTANTS: Care management and social work.   RADIOLOGIC STUDIES: Chest x-ray, portable, single view, on the 24th of September 2014, showed no acute cardiopulmonary disease. Chronic changes were noted. Repeat chest x-ray, PA and lateral, 25th of September 2014, revealed atherosclerotic disease, osteopenia with adjacent levels to vertebroplasty noted. Mild hyperinflation.   Ultrasound of bilateral lower extremities, the 25th of September 2014, revealed no evidence of DVT in either lower extremity.   HOSPITAL COURSE: The patient is an 81 year old Caucasian female with history of tobacco abuse, who smoked in the past, however, quit. She presents  to the hospital with complaints of shortness of breath. Please refer to Dr. Larose Hires admission note on the 24th of September 2014.   On arrival to the hospital, the patient's temperature was 99.2. Pulse was 76. Respiration rate was 24. Blood pressure 140/66. Pulse oximetry was 90% on 2 liters of oxygen through nasal cannula.   Physical exam revealed wheezing, overall decreased air entrance, and bilaterally equal air entrance.   The patient's EKG showed sinus tachy on presentation; however, later it slowed down.   LABORATORIES: Normal BMP except of elevation of glucose 142. Beta-type natriuretic peptide was 798. Cardiac enzymes, first set and the only set, was done which was negative. CBC within normal limits with white blood cell count of 10.4, hemoglobin 13.5, platelet count 272.   Absolute neutrophil cell count was 9.7. D-dimer was elevated at 1.14. Blood cultures taken on the 24th of September 2014 did not show any growth.   The patient was admitted to the hospital for further evaluation. She was continued on oxygen, steroids, antibiotics as well as inhalation therapy. With this, her condition significantly improved.   On day of discharge, 26th of September 2014, the patient's lung exam revealed no wheezing and good air entrance bilaterally. It was felt that this patient is stable to be discharged. Moreover, she was weaned off oxygen. Her O2 saturations remained 95% on room air at rest and was 91% on exertion on room air.   It was felt that the patient is to continue antibiotics as well as steroid taper and inhalation  therapy.   She is to follow up with her primary care physician, Dr. Elease HashimotoMaloney, in the next few days after discharge.   In regard to hyperglycemia, it was felt to be stress related; however, it is recommended to follow the patient's hemoglobin A1c as outpatient.   In regard to elevated D-dimer, as the patient's oxygenation improved and was good on room air, it was felt to be  COPD related. The patient had ultrasound of her lower extremities done and that showed no DVT.   For history of glaucoma, she is to continue her management as previously she did.   DISCHARGE VITAL SIGNS: Temperature is 98.5. Pulse was 70s to 130s. Respiratory rate was 20. Blood pressure 118/67. Saturation was 91% on room air on exertion and 95% at rest.   TIME SPENT: 40 minutes.    ____________________________ Katharina Caperima Neasia Fleeman, MD rv:np D: 03/25/2013 16:35:58 ET T: 03/25/2013 17:12:15 ET JOB#: [id number]  cc: Katharina Caperima Petrina Melby, MD, <Dictator> Leo GrosserNancy J. Maloney, MD Robby Bulkley MD ELECTRONICALLY SIGNED 04/12/2013 8:22

## 2014-10-30 DIAGNOSIS — H4011X3 Primary open-angle glaucoma, severe stage: Secondary | ICD-10-CM | POA: Diagnosis not present

## 2014-11-04 DIAGNOSIS — J439 Emphysema, unspecified: Secondary | ICD-10-CM | POA: Diagnosis not present

## 2014-11-04 DIAGNOSIS — J9611 Chronic respiratory failure with hypoxia: Secondary | ICD-10-CM | POA: Diagnosis not present

## 2014-11-12 DIAGNOSIS — J9611 Chronic respiratory failure with hypoxia: Secondary | ICD-10-CM | POA: Diagnosis not present

## 2014-11-12 DIAGNOSIS — J439 Emphysema, unspecified: Secondary | ICD-10-CM | POA: Diagnosis not present

## 2014-11-17 DIAGNOSIS — J449 Chronic obstructive pulmonary disease, unspecified: Secondary | ICD-10-CM | POA: Diagnosis not present

## 2014-11-29 ENCOUNTER — Other Ambulatory Visit: Payer: Self-pay

## 2014-11-29 DIAGNOSIS — R5383 Other fatigue: Secondary | ICD-10-CM | POA: Insufficient documentation

## 2014-11-29 DIAGNOSIS — M1990 Unspecified osteoarthritis, unspecified site: Secondary | ICD-10-CM | POA: Insufficient documentation

## 2014-11-29 DIAGNOSIS — G4700 Insomnia, unspecified: Secondary | ICD-10-CM | POA: Insufficient documentation

## 2014-11-29 DIAGNOSIS — H409 Unspecified glaucoma: Secondary | ICD-10-CM | POA: Insufficient documentation

## 2014-11-29 DIAGNOSIS — Z9981 Dependence on supplemental oxygen: Secondary | ICD-10-CM | POA: Insufficient documentation

## 2014-11-30 ENCOUNTER — Ambulatory Visit (INDEPENDENT_AMBULATORY_CARE_PROVIDER_SITE_OTHER): Payer: Medicare Other | Admitting: Family Medicine

## 2014-11-30 ENCOUNTER — Encounter: Payer: Self-pay | Admitting: Family Medicine

## 2014-11-30 ENCOUNTER — Other Ambulatory Visit: Payer: Self-pay

## 2014-11-30 VITALS — BP 138/64 | HR 72 | Temp 98.5°F | Resp 20 | Ht 61.0 in | Wt 123.0 lb

## 2014-11-30 DIAGNOSIS — J439 Emphysema, unspecified: Secondary | ICD-10-CM

## 2014-11-30 DIAGNOSIS — M940 Chondrocostal junction syndrome [Tietze]: Secondary | ICD-10-CM | POA: Diagnosis not present

## 2014-11-30 DIAGNOSIS — G4700 Insomnia, unspecified: Secondary | ICD-10-CM | POA: Diagnosis not present

## 2014-11-30 DIAGNOSIS — M159 Polyosteoarthritis, unspecified: Secondary | ICD-10-CM

## 2014-11-30 MED ORDER — MELOXICAM 7.5 MG PO TABS: 7.5000 mg | ORAL_TABLET | Freq: Every day | ORAL | Status: DC

## 2014-11-30 MED ORDER — ALPRAZOLAM 0.5 MG PO TABS: 0.5000 mg | ORAL_TABLET | Freq: Every evening | ORAL | Status: DC | PRN

## 2014-11-30 MED ORDER — PROAIR RESPICLICK 108 (90 BASE) MCG/ACT IN AEPB: 1.0000 | INHALATION_SPRAY | Freq: Four times a day (QID) | RESPIRATORY_TRACT | Status: DC

## 2014-11-30 NOTE — Progress Notes (Signed)
Subjective:    Patient ID: [name] DownsEsther M [name], female    DOB: 1929/09/14, 81 y.o.   MRN: [id number]  Hand Pain  The incident occurred more than 1 week ago (4-5). The incident occurred at home (tripping on O2 tubing, pt "caught herself on the door"). The injury mechanism was a direct blow. The pain is present in the left hand. The quality of the pain is described as stabbing. The pain radiates to the left arm. The pain is at a severity of 4/10. The pain is moderate. The pain has been improving since the incident. Associated symptoms include chest pain. Pertinent negatives include no muscle weakness, numbness or tingling. Associated symptoms comments: Pt also experiencing life sided rib pain. The symptoms are aggravated by lifting. She has tried acetaminophen (aspirin) for the symptoms. The treatment provided moderate relief.  Chest Pain  This is a new problem. The current episode started 1 to 4 weeks ago. The onset quality is sudden. The problem occurs intermittently. The problem has been unchanged. Pain location: left axilla/ rib. The pain is at a severity of 9/10. The pain is severe. The quality of the pain is described as sharp. The pain does not radiate. Associated symptoms include back pain. Pertinent negatives include no abdominal pain, claudication, cough, diaphoresis, dizziness, exertional chest pressure, fever, headaches, hemoptysis, irregular heartbeat, leg pain, lower extremity edema, malaise/fatigue, nausea, near-syncope, numbness, orthopnea, palpitations, sputum production, syncope, vomiting or weakness. The pain is aggravated by deep breathing.  Her past medical history is significant for COPD.  Pertinent negatives for past medical history include no aneurysm, no aortic aneurysm, no aortic dissection, no arrhythmia, no CHF, no DVT, no MI, no mitral valve prolapse, no pacemaker, no PE and no PVD.      Review of Systems  Constitutional: Negative for fever, malaise/fatigue, diaphoresis and  appetite change.  Respiratory: Negative for cough, hemoptysis and sputum production.   Cardiovascular: Positive for chest pain. Negative for palpitations, orthopnea, claudication, syncope and near-syncope.  Gastrointestinal: Negative for nausea, vomiting and abdominal pain.  Musculoskeletal: Positive for back pain and joint swelling.       HAND SWELLING  Neurological: Negative for dizziness, tingling, weakness, numbness and headaches.     Past Medical History  Diagnosis Date  . Glaucoma   . COPD (chronic obstructive pulmonary disease)   . Nocturnal leg cramps   . Back pain   . Insomnia   . Osteoarthritis    Past Surgical History  Procedure Laterality Date  . Hip fracture surgery  2008  . Appendectomy  1947  . Vaginal hysterectomy  1975  . Breast surgery  1969    biopsy    reports that she quit smoking about 13 years ago. Her smoking use included Cigarettes. She has a 50 pack-year smoking history. She has never used smokeless tobacco. She reports that she does not drink alcohol or use illicit drugs. family history includes Cancer in her sister; Cerebral aneurysm in her father. Allergies  Allergen Reactions  . Shellfish Allergy   . Codeine     headache  . Prednisone     Altered Mental Status  . Tetracycline       Current Outpatient Prescriptions on File Prior to Visit  Medication Sig Dispense Refill  . arformoterol (BROVANA) 15 MCG/2ML NEBU Take 2 mLs (15 mcg total) by nebulization 2 (two) times daily. 360 mL 3  . brinzolamide (AZOPT) 1 % ophthalmic suspension Place 1 drop into both eyes 3 (three) times daily.    .Marland Kitchen  cholecalciferol (VITAMIN D) 1000 UNITS tablet Take 1,000 Units by mouth daily.    Marland Kitchen latanoprost (XALATAN) 0.005 % ophthalmic solution Place 1 drop into both eyes at bedtime.    . Multiple Vitamins-Minerals (CENTRUM SILVER ADULT 50+ PO) Take 1 tablet by mouth daily.     No current facility-administered medications on file prior to visit.    BP 138/64 mmHg   Pulse 72  Temp(Src) 98.5 F (36.9 C) (Oral)  Resp 20  Ht  (1.549 m)  Wt 123 lb (55.792 kg)  BMI 23.25 kg/m2  Objective:   Physical Exam  Constitutional: She is oriented to person, place, and time. She appears well-developed and well-nourished.  HENT:  Head: Normocephalic.  Cardiovascular: Normal rate and regular rhythm.   Pulmonary/Chest: Effort normal. No respiratory distress. She has decreased breath sounds. She has no wheezes. Chest wall is not dull to percussion. She exhibits tenderness (under left axilla ). She exhibits no mass.  Neurological: She is alert and oriented to person, place, and time.  Psychiatric: She has a normal mood and affect. Her behavior is normal. Thought content normal.          Assessment & Plan:  1. Insomnia  Will restart medication per patient request. Understands risk of falls.   - ALPRAZolam (XANAX) 0.5 MG tablet; Take 1 tablet (0.5 mg total) by mouth at bedtime as needed for sleep (Take 1/2- 1 tab po qhs prn for insomnia).  Dispense: 30 tablet; Refill: 5  2. Pulmonary emphysema, unspecified emphysema type Condition is stable. Please continue current medication and  plan of care as noted.   - OXYGEN; Inhale into the lungs. - PROAIR RESPICLICK 108 (90 BASE) MCG/ACT AEPB; Inhale 1 puff into the lungs every 6 (six) hours.  Dispense: 3 each; Refill: 3  3. Costochondritis, acute New  Problem. Please call back if condition worsens or does not continue to improve.     4. Osteoarthritis of multiple joints, unspecified osteoarthritis type Condition is worsening. Will start medication for better control.   - meloxicam (MOBIC) 7.5 MG tablet; Take 1 tablet (7.5 mg total) by mouth daily.  Dispense: 30 tablet; Refill: 0

## 2015-01-24 ENCOUNTER — Ambulatory Visit (INDEPENDENT_AMBULATORY_CARE_PROVIDER_SITE_OTHER): Payer: Medicare Other | Admitting: Family Medicine

## 2015-01-24 ENCOUNTER — Encounter: Payer: Self-pay | Admitting: Family Medicine

## 2015-01-24 ENCOUNTER — Telehealth: Payer: Self-pay | Admitting: Family Medicine

## 2015-01-24 VITALS — BP 120/70 | HR 72 | Temp 97.0°F | Resp 18 | Wt 121.8 lb

## 2015-01-24 DIAGNOSIS — M150 Primary generalized (osteo)arthritis: Secondary | ICD-10-CM | POA: Diagnosis not present

## 2015-01-24 DIAGNOSIS — M159 Polyosteoarthritis, unspecified: Secondary | ICD-10-CM

## 2015-01-24 DIAGNOSIS — Z9981 Dependence on supplemental oxygen: Secondary | ICD-10-CM

## 2015-01-24 DIAGNOSIS — J449 Chronic obstructive pulmonary disease, unspecified: Secondary | ICD-10-CM

## 2015-01-24 MED ORDER — ARFORMOTEROL TARTRATE 15 MCG/2ML IN NEBU: 15.0000 ug | INHALATION_SOLUTION | Freq: Two times a day (BID) | RESPIRATORY_TRACT | Status: DC

## 2015-01-24 MED ORDER — ALBUTEROL SULFATE HFA 108 (90 BASE) MCG/ACT IN AERS: 2.0000 | INHALATION_SPRAY | RESPIRATORY_TRACT | Status: DC | PRN

## 2015-01-24 NOTE — Telephone Encounter (Signed)
Please call [employer] and check on patient's oxygen.   She is supposed to be getting portable oxygen with battery at 2 liters.

## 2015-01-24 NOTE — Progress Notes (Signed)
[name] Female    DOB: 1929-09-17   81 y.o.   MRN: [id number] Visit Date: 01/24/2015  Today's Provider: Lorie Phenix, MD   Chief Complaint  Patient presents with  . Follow-up    on medication   Subjective:    Breathing Problem She complains of difficulty breathing and shortness of breath (on oxygen). There is no cough or wheezing. This is a chronic problem. The problem occurs constantly. Pertinent negatives include no chest pain or fever. Her symptoms are aggravated by minimal activity, any activity, change in weather and climbing stairs. Her symptoms are alleviated by rest. She reports minimal (Oxygen does help but can not use portable oxygen. Waiting on battery operated oxygen. ) improvement on treatment. Her symptoms are not alleviated by beta-agonist and steroid inhaler. Her past medical history is significant for emphysema.  Arthritis Presents for follow-up visit. The condition has lasted for 2 months. She complains of pain and joint swelling (due to arthritis in her thumb). Associated symptoms include fatigue. Pertinent negatives include no fever. Her past medical history is significant for osteoarthritis. Family history of chronic back pain: Aspirin helps a little.  Past treatments include acetaminophen and NSAIDs. The treatment provided no relief.          Allergies  Allergen Reactions  . Shellfish Allergy   . Codeine     headache  . Prednisone     Altered Mental Status  . Tetracycline    Previous Medications   ALPRAZOLAM (XANAX) 0.5 MG TABLET    Take 1 tablet (0.5 mg total) by mouth at bedtime as needed for sleep (Take 1/2- 1 tab po qhs prn for insomnia).   ARFORMOTEROL (BROVANA) 15 MCG/2ML NEBU    Take 2 mLs (15 mcg total) by nebulization 2 (two) times daily.   CHOLECALCIFEROL (VITAMIN D) 1000 UNITS TABLET    Take 1,000 Units by mouth daily.   DORZOLAMIDE (TRUSOPT) 2 % OPHTHALMIC SOLUTION       LATANOPROST (XALATAN) 0.005 % OPHTHALMIC SOLUTION     Place 1 drop into both eyes at bedtime.   MELOXICAM (MOBIC) 7.5 MG TABLET    Take 1 tablet (7.5 mg total) by mouth daily.   MULTIPLE VITAMINS-MINERALS (CENTRUM SILVER ADULT 50+ PO)    Take 1 tablet by mouth daily.   OXYGEN    Inhale into the lungs.   PROAIR RESPICLICK 108 (90 BASE) MCG/ACT AEPB    Inhale 1 puff into the lungs every 6 (six) hours.    Review of Systems  Constitutional: Positive for fatigue. Negative for fever and activity change.  HENT: Negative.  Negative for congestion.   Eyes: Negative.   Respiratory: Positive for shortness of breath (on oxygen). Negative for cough, chest tightness and wheezing.   Cardiovascular: Negative for chest pain and leg swelling.  Gastrointestinal: Negative.   Endocrine: Negative.   Genitourinary: Negative.   Musculoskeletal: Positive for back pain, joint swelling (due to arthritis in her thumb), arthralgias and arthritis.  Skin: Negative.   Allergic/Immunologic: Negative.   Neurological: Negative.   Hematological: Negative.   Psychiatric/Behavioral: Negative.     History  Substance Use Topics  . Smoking status: Former Smoker -- 1.00 packs/day for 50 years    Types: Cigarettes    Quit date: 06/30/2000  . Smokeless tobacco: Never Used  . Alcohol Use: No   Objective:   BP 120/70 mmHg  Pulse 72  Temp(Src) 97 F (36.1 C) (Axillary)  Resp 18  Wt 121 lb 12.8 oz (55.248 kg)  Physical Exam  Constitutional: She is oriented to person, place, and time. She appears well-developed and well-nourished.  Cardiovascular: Normal rate and regular rhythm.   Pulmonary/Chest: Effort normal. She has wheezes.  Neurological: She is alert and oriented to person, place, and time.      Assessment & Plan:     1. Chronic obstructive pulmonary disease, unspecified COPD, unspecified chronic bronchitis type Stable. Using her oxygen. Does feel better on it.  Having trouble with getting the right portable oxygen.  What she has it too heavy. Has new order in.   Will send in medication as below.    - arformoterol (BROVANA) 15 MCG/2ML NEBU; Take 2 mLs (15 mcg total) by nebulization 2 (two) times daily.  Dispense: 360 mL; Refill: 3 - albuterol (PROVENTIL HFA;VENTOLIN HFA) 108 (90 BASE) MCG/ACT inhaler; Inhale 2 puffs into the lungs every 4 (four) hours as needed for wheezing or shortness of breath.  Dispense: 3 Inhaler; Refill: 3  2. Dependence on supplemental oxygen Continue oxygen.   3.  For arthritis- Will consider referral if symptoms worsen.  Does not want to be referred at this time.   Lorie Phenix, MD       Lorie Phenix, MD  [facility]

## 2015-01-25 NOTE — Telephone Encounter (Signed)
Called [employer] regarding pt's portable Oxygen  tank, they stated that they are waiting for the insurance verification and then they will be working on the approval.

## 2015-02-21 ENCOUNTER — Ambulatory Visit (INDEPENDENT_AMBULATORY_CARE_PROVIDER_SITE_OTHER): Payer: Medicare Other | Admitting: Family Medicine

## 2015-02-21 ENCOUNTER — Encounter: Payer: Self-pay | Admitting: Family Medicine

## 2015-02-21 VITALS — BP 132/78 | HR 80 | Temp 98.1°F | Resp 24 | Wt 120.0 lb

## 2015-02-21 DIAGNOSIS — M1990 Unspecified osteoarthritis, unspecified site: Secondary | ICD-10-CM | POA: Diagnosis not present

## 2015-02-21 DIAGNOSIS — J449 Chronic obstructive pulmonary disease, unspecified: Secondary | ICD-10-CM

## 2015-02-21 MED ORDER — ALBUTEROL SULFATE HFA 108 (90 BASE) MCG/ACT IN AERS
2.0000 | INHALATION_SPRAY | Freq: Four times a day (QID) | RESPIRATORY_TRACT | Status: DC | PRN
Start: 2015-02-21 — End: 2015-04-26

## 2015-02-21 NOTE — Progress Notes (Signed)
Patient ID: [name] [name], female   DOB: 1930/04/22, 81 y.o.   MRN: [id number]        [name] Female    DOB: 12/19/1929   81 y.o.   MRN: [id number] Visit Date: 02/21/2015  Today's Provider: Lorie Phenix, MD   Chief Complaint  Patient presents with  . COPD   Subjective:    Breathing Problem She complains of cough, difficulty breathing, shortness of breath and sputum production. There is no wheezing. This is a chronic problem. The problem occurs constantly. The problem has been gradually worsening. The cough is productive of sputum and productive. Associated symptoms include rhinorrhea. Pertinent negatives include no appetite change, chest pain, fever, headaches, PND, postnasal drip or sneezing.       Allergies  Allergen Reactions  . Shellfish Allergy   . Codeine     headache  . Prednisone     Altered Mental Status  . Tetracycline    Previous Medications   ALPRAZOLAM (XANAX) 0.5 MG TABLET    Take 1 tablet (0.5 mg total) by mouth at bedtime as needed for sleep (Take 1/2- 1 tab po qhs prn for insomnia).   ARFORMOTEROL (BROVANA) 15 MCG/2ML NEBU    Take 2 mLs (15 mcg total) by nebulization 2 (two) times daily.   CHOLECALCIFEROL (VITAMIN D) 1000 UNITS TABLET    Take 1,000 Units by mouth daily.   DORZOLAMIDE (TRUSOPT) 2 % OPHTHALMIC SOLUTION       LATANOPROST (XALATAN) 0.005 % OPHTHALMIC SOLUTION    Place 1 drop into both eyes at bedtime.   MELOXICAM (MOBIC) 7.5 MG TABLET       MULTIPLE VITAMINS-MINERALS (CENTRUM SILVER ADULT 50+ PO)    Take 1 tablet by mouth daily.   OXYGEN    Inhale into the lungs.    Review of Systems  Constitutional: Positive for chills. Negative for fever, diaphoresis, activity change, appetite change, fatigue and unexpected weight change.  HENT: Positive for rhinorrhea. Negative for postnasal drip and sneezing.   Respiratory: Positive for cough, sputum production and shortness of breath. Negative for apnea, choking, chest tightness, wheezing  and stridor.   Cardiovascular: Positive for palpitations. Negative for chest pain, leg swelling and PND.  Gastrointestinal: Negative for nausea, vomiting, abdominal pain, diarrhea, constipation, blood in stool, abdominal distention, anal bleeding and rectal pain.  Neurological: Positive for dizziness and light-headedness. Negative for headaches.    Social History  Substance Use Topics  . Smoking status: Former Smoker -- 1.00 packs/day for 50 years    Types: Cigarettes    Quit date: 06/30/2000  . Smokeless tobacco: Never Used  . Alcohol Use: No   Objective:   BP 132/78 mmHg  Pulse 80  Temp(Src) 98.1 F (36.7 C) (Oral)  Resp 24  Wt 120 lb (54.432 kg)  Physical Exam  Constitutional: She is oriented to person, place, and time. She appears well-developed and well-nourished.  Neurological: She is alert and oriented to person, place, and time.  Vitals reviewed.       Assessment & Plan:     1. Chronic obstructive pulmonary disease, unspecified COPD, unspecified chronic bronchitis type Refilled her Albuterol as generic, on her formulary. Does not want to go back to pulmonologist.COPD is getting worse. More SOB with ADLs. Did not really help her.  Finds it embarrassing that she get so SOB.  Still waiting on her portable oxygen.    2. Arthritis Worsening. Will treat as needed.       Lorie Phenix,  MD  [facility]

## 2015-02-23 ENCOUNTER — Telehealth: Payer: Self-pay | Admitting: Family Medicine

## 2015-02-23 NOTE — Telephone Encounter (Signed)
albuterol (PROVENTIL HFA;VENTOLIN HFA) 108 (90 BASE) MCG/ACT inhaler Authorization # with Blake [employer] [phone] Fax# [phone]  Pt called and said that she had been on the phone with the insurance company about getting her inhaler and needed to give Dr. Elease Hashimoto the numbers so she can get her medication. Pt stated it needed prior authorization. I asked if she needed a new RX or if the insurance denied to pay for the medication. Pt stated all she knows is it needs prior authorization and that Dr. Elease Hashimoto would know what she needed. Thanks TNP

## 2015-02-23 NOTE — Telephone Encounter (Signed)
Left message saying that medication has been approved and sent into the pharmacy. Meds were sent thru Mail order pharmacy.

## 2015-04-26 ENCOUNTER — Ambulatory Visit (INDEPENDENT_AMBULATORY_CARE_PROVIDER_SITE_OTHER): Payer: Medicare Other | Admitting: Family Medicine

## 2015-04-26 ENCOUNTER — Encounter: Payer: Self-pay | Admitting: Family Medicine

## 2015-04-26 VITALS — BP 126/72 | HR 92 | Temp 98.3°F | Resp 24 | Wt 119.0 lb

## 2015-04-26 DIAGNOSIS — G4700 Insomnia, unspecified: Secondary | ICD-10-CM | POA: Diagnosis not present

## 2015-04-26 DIAGNOSIS — Z23 Encounter for immunization: Secondary | ICD-10-CM | POA: Diagnosis not present

## 2015-04-26 DIAGNOSIS — Z9981 Dependence on supplemental oxygen: Secondary | ICD-10-CM | POA: Diagnosis not present

## 2015-04-26 DIAGNOSIS — J449 Chronic obstructive pulmonary disease, unspecified: Secondary | ICD-10-CM | POA: Diagnosis not present

## 2015-04-26 MED ORDER — ALBUTEROL SULFATE HFA 108 (90 BASE) MCG/ACT IN AERS: 2.0000 | INHALATION_SPRAY | RESPIRATORY_TRACT | Status: DC | PRN

## 2015-04-26 MED ORDER — TIOTROPIUM BROMIDE MONOHYDRATE 18 MCG IN CAPS: 18.0000 ug | ORAL_CAPSULE | Freq: Every day | RESPIRATORY_TRACT | Status: DC

## 2015-04-26 NOTE — Progress Notes (Signed)
Patient ID: [name] [name], female   DOB: [birth year], 81 y.o.   MRN: [id number]       [name] Female    DOB: [birth year]   81 y.o.   MRN: [id number] Visit Date: 04/26/2015  Today's Provider: Lorie PhenixNancy Taesean Reth, MD   Chief Complaint  Patient presents with  . COPD    Follow up    Subjective:    Breathing Problem She complains of difficulty breathing, shortness of breath and wheezing. This is a chronic problem. The problem occurs constantly. The problem has been unchanged. Associated symptoms include myalgias. Pertinent negatives include no chest pain. Her symptoms are aggravated by any activity. Her symptoms are alleviated by rest and steroid inhaler. She reports minimal improvement on treatment. Her symptoms are not alleviated by beta-agonist (On kind makes her jittery, but proair helps.  ). Her past medical history is significant for COPD.   Arthritis is still bothering her. No longer taking  Meloxicam. Does have some at home.  Also,  Alprazolam helps her sleep.      Allergies  Allergen Reactions  . Shellfish Allergy   . Codeine     headache  . Prednisone     Altered Mental Status  . Tetracycline    Previous Medications   ALBUTEROL (PROVENTIL HFA;VENTOLIN HFA) 108 (90 BASE) MCG/ACT INHALER    Inhale 2 puffs into the lungs every 6 (six) hours as needed for wheezing or shortness of breath.   ALPRAZOLAM (XANAX) 0.5 MG TABLET    Take 1 tablet (0.5 mg total) by mouth at bedtime as needed for sleep (Take 1/2- 1 tab po qhs prn for insomnia).   ARFORMOTEROL (BROVANA) 15 MCG/2ML NEBU    Take 2 mLs (15 mcg total) by nebulization 2 (two) times daily.   CHOLECALCIFEROL (VITAMIN D) 1000 UNITS TABLET    Take 1,000 Units by mouth daily.   DORZOLAMIDE (TRUSOPT) 2 % OPHTHALMIC SOLUTION       LATANOPROST (XALATAN) 0.005 % OPHTHALMIC SOLUTION    Place 1 drop into both eyes at bedtime.   MELOXICAM (MOBIC) 7.5 MG TABLET       MULTIPLE VITAMINS-MINERALS (CENTRUM SILVER ADULT 50+ PO)    Take 1  tablet by mouth daily.   OXYGEN    Inhale into the lungs.    Review of Systems  Constitutional: Negative.   Respiratory: Positive for shortness of breath and wheezing.   Cardiovascular: Negative for chest pain.  Musculoskeletal: Positive for myalgias, joint swelling and arthralgias.  Psychiatric/Behavioral: Negative.     Social History  Substance Use Topics  . Smoking status: Former Smoker -- 1.00 packs/day for 50 years    Types: Cigarettes    Quit date: 06/30/2000  . Smokeless tobacco: Never Used  . Alcohol Use: No   Objective:   BP 126/72 mmHg  Pulse 92  Temp(Src) 98.3 F (36.8 C)  Resp 24  Wt 119 lb (53.978 kg)  SpO2 93%  Physical Exam  Constitutional: She is oriented to person, place, and time. She appears well-developed and well-nourished.  Cardiovascular: Normal rate and regular rhythm.   Pulmonary/Chest: Effort normal.  Decreased breath sounds and increased work of breathing.   Neurological: She is alert and oriented to person, place, and time.  Psychiatric: She has a normal mood and affect. Her behavior is normal. Thought content normal.      Assessment & Plan:     1. Chronic obstructive pulmonary disease, unspecified COPD type (HCC) Worsening. Will refill medication and restart  Spiriva.  - albuterol (PROVENTIL HFA;VENTOLIN HFA) 108 (90 BASE) MCG/ACT inhaler; Inhale 2 puffs into the lungs every 4 (four) hours as needed for wheezing or shortness of breath.  Dispense: 3 Inhaler; Refill: 0  2. Dependence on supplemental oxygen Added medication to see if helps with SOB, also will increase oxygen to 4 liters secondary to long cord.  - tiotropium (SPIRIVA) 18 MCG inhalation capsule; Place 1 capsule (18 mcg total) into inhaler and inhale daily.  Dispense: 90 capsule; Refill: 3  3. Cannot sleep Will increase medication.   4. Need for influenza vaccination Given today.  - Flu vaccine HIGH DOSE PF (Fluzone High dose)  5. Need for pneumococcal vaccination Given  today.  - Pneumococcal conjugate vaccine 13-valent     Lorie Phenix, MD  [facility]

## 2015-06-08 ENCOUNTER — Other Ambulatory Visit: Payer: Self-pay | Admitting: Family Medicine

## 2015-06-08 DIAGNOSIS — G4700 Insomnia, unspecified: Secondary | ICD-10-CM

## 2015-06-11 NOTE — Telephone Encounter (Signed)
Printed, please fax or call in to pharmacy. Thank you.   

## 2015-12-25 ENCOUNTER — Encounter: Payer: Self-pay | Admitting: Family Medicine

## 2015-12-25 ENCOUNTER — Ambulatory Visit (INDEPENDENT_AMBULATORY_CARE_PROVIDER_SITE_OTHER): Payer: Medicare Other | Admitting: Family Medicine

## 2015-12-25 VITALS — BP 136/76 | HR 89 | Temp 98.2°F | Resp 24 | Ht 61.0 in | Wt 121.0 lb

## 2015-12-25 DIAGNOSIS — J449 Chronic obstructive pulmonary disease, unspecified: Secondary | ICD-10-CM | POA: Diagnosis not present

## 2015-12-25 DIAGNOSIS — Z0000 Encounter for general adult medical examination without abnormal findings: Secondary | ICD-10-CM | POA: Diagnosis not present

## 2015-12-25 MED ORDER — ALBUTEROL SULFATE HFA 108 (90 BASE) MCG/ACT IN AERS: 2.0000 | INHALATION_SPRAY | RESPIRATORY_TRACT | Status: DC | PRN

## 2015-12-25 MED ORDER — ARFORMOTEROL TARTRATE 15 MCG/2ML IN NEBU: 15.0000 ug | INHALATION_SOLUTION | Freq: Two times a day (BID) | RESPIRATORY_TRACT | Status: DC

## 2015-12-25 NOTE — Progress Notes (Signed)
[name], Female    DOB: 1929-12-13, 82 y.o.   MRN: [id number] Visit Date: 12/25/2015  Today's Provider: Lorie Phenix, MD   Chief Complaint  Patient presents with  . Medicare Wellness   Subjective:    Annual wellness visit [name] [name] is a 82 y.o. female. She feels well. She reports exercising active with daily activities. She reports she is sleeping fairly well.  01/22/11 AWE -----------------------------------------------------------   Review of Systems  Constitutional: Negative.   HENT: Positive for nosebleeds, postnasal drip and rhinorrhea.   Eyes: Negative.   Respiratory: Positive for cough, chest tightness, shortness of breath and wheezing.   Cardiovascular: Negative.   Gastrointestinal: Negative.   Endocrine: Negative.   Genitourinary: Negative.   Musculoskeletal: Positive for back pain and arthralgias.  Skin: Negative.   Allergic/Immunologic: Positive for food allergies.  Neurological: Negative.   Hematological: Negative.   Psychiatric/Behavioral: Negative.     Social History   Social History  . Marital Status: Single    Spouse Name: N/A  . Number of Children: 2  . Years of Education: N/A   Occupational History  . Not on file.   Social History Main Topics  . Smoking status: Former Smoker -- 1.00 packs/day for 50 years    Types: Cigarettes    Quit date: 06/30/2000  . Smokeless tobacco: Never Used  . Alcohol Use: No  . Drug Use: No  . Sexual Activity: Not on file   Other Topics Concern  . Not on file   Social History Narrative    Past Medical History  Diagnosis Date  . Glaucoma   . COPD (chronic obstructive pulmonary disease) (HCC)   . Nocturnal leg cramps   . Back pain   . Insomnia   . Osteoarthritis      Patient Active Problem List   Diagnosis Date Noted  . Glaucoma 11/29/2014  . Insomnia 11/29/2014  . Arthritis, degenerative 11/29/2014  . Dependence on supplemental oxygen 11/29/2014  . Adynamia 11/29/2014    . COPD (chronic obstructive pulmonary disease) (HCC) 11/29/2013  . Chronic hypoxemic respiratory failure (HCC) 11/29/2013    Past Surgical History  Procedure Laterality Date  . Hip fracture surgery  2008  . Appendectomy  1947  . Vaginal hysterectomy  1975  . Breast surgery  1969    biopsy    Her family history includes Cancer in her sister; Cerebral aneurysm in her father.    Current Meds  Medication Sig  . albuterol (PROVENTIL HFA;VENTOLIN HFA) 108 (90 BASE) MCG/ACT inhaler Inhale 2 puffs into the lungs every 4 (four) hours as needed for wheezing or shortness of breath.  Marland Kitchen arformoterol (BROVANA) 15 MCG/2ML NEBU Take 2 mLs (15 mcg total) by nebulization 2 (two) times daily.  . cholecalciferol (VITAMIN D) 1000 UNITS tablet Take 1,000 Units by mouth daily.  . dorzolamide (TRUSOPT) 2 % ophthalmic solution   . latanoprost (XALATAN) 0.005 % ophthalmic solution Place 1 drop into both eyes at bedtime.  . Multiple Vitamins-Minerals (CENTRUM SILVER ADULT 50+ PO) Take 1 tablet by mouth daily.  . OXYGEN Inhale into the lungs.    Patient Care Team: Lorie Phenix, MD as PCP - General (Family Medicine)    Objective:   Vitals: BP 136/76 mmHg  Pulse 89  Temp(Src) 98.2 F (36.8 C) (Oral)  Resp 24  Ht  (1.549 m)  Wt 121 lb (54.885 kg)  BMI 22.87 kg/m2  SpO2 97%  Physical Exam  Constitutional:  She is oriented to person, place, and time. She appears well-developed and well-nourished.  HENT:  Head: Normocephalic and atraumatic.  Right Ear: Tympanic membrane, external ear and ear canal normal.  Left Ear: Tympanic membrane, external ear and ear canal normal.  Nose: Nose normal.  Mouth/Throat: Uvula is midline, oropharynx is clear and moist and mucous membranes are normal.  Eyes: Conjunctivae, EOM and lids are normal. Pupils are equal, round, and reactive to light.  Neck: Trachea normal and normal range of motion. Neck supple. Carotid bruit is not present. No thyroid mass and no  thyromegaly present.  Cardiovascular: Normal rate, regular rhythm and normal heart sounds.   Pulmonary/Chest: Effort normal and breath sounds normal.  Abdominal: Soft. Normal appearance and bowel sounds are normal. There is no hepatosplenomegaly. There is no tenderness.  Musculoskeletal: Normal range of motion.  Lymphadenopathy:    She has no cervical adenopathy.    She has no axillary adenopathy.  Neurological: She is alert and oriented to person, place, and time. She has normal strength. No cranial nerve deficit.  Skin: Skin is warm, dry and intact.  Psychiatric: She has a normal mood and affect. Her speech is normal and behavior is normal. Judgment and thought content normal. Cognition and memory are normal.    Activities of Daily Living In your present state of health, do you have any difficulty performing the following activities: 12/25/2015  Hearing? Y  Vision? Y  Difficulty concentrating or making decisions? Y  Walking or climbing stairs? Y  Dressing or bathing? N  Doing errands, shopping? N    Fall Risk Assessment Fall Risk  12/25/2015 11/30/2014  Falls in the past year? No No  Risk for fall due to : - Impaired vision;Impaired balance/gait     Depression Screen PHQ 2/9 Scores 12/25/2015 11/30/2014  PHQ - 2 Score 0 1    Cognitive Testing - 6-CIT  Correct? Score   What year is it? yes 0 0 or 4  What month is it? yes 0 0 or 3  Memorize:    [name],  [name],  44,  [address],  [place],      What time is it? (within 1 hour) yes 0 0 or 3  Count backwards from 20 yes 0 0, 2, or 4  Name the months of the year yes 1 0, 2, or 4  Repeat name & address above yes 1 0, 2, 4, 6, 8, or 10       TOTAL SCORE  2/28   Interpretation:  Normal  Normal (0-7) Abnormal (8-28)       Assessment & Plan:     Annual Wellness Visit  Reviewed patient's Family Medical History Reviewed and updated list of patient's medical providers Assessment of cognitive impairment was done Assessed patient's  functional ability Established a written schedule for health screening services Health Risk Assessent Completed and Reviewed  Exercise Activities and Dietary recommendations Goals    None      Immunization History  Administered Date(s) Administered  . Influenza, High Dose Seasonal PF 03/31/2013, 04/26/2015  . Influenza,inj,Quad PF,36+ Mos 06/12/2014  . Pneumococcal Conjugate-13 04/26/2015  . Pneumococcal Polysaccharide-23 01/22/2011   ------------------------------------------------------------------------------------------------------------ 1. Medicare annual wellness visit, subsequent Stable. Patient advised to continue eating healthy and exercise daily.   2. Chronic obstructive pulmonary disease, unspecified COPD type (HCC) Stable. Patient advised continue current medications, oxygen and plan of care.   Patient seen and examined by Dr. Leo GrosserNancy J.. Natalya Domzalski, and note scribed by Liz BeachSulibeya S. Dimas,  CMA.  I have reviewed the document for accuracy and completeness and I agree with above. - Leo GrosserNancy J. Keiyana Stehr, MD   Lorie PhenixNancy Aliene Tamura, MD  [facility]

## 2016-02-12 ENCOUNTER — Ambulatory Visit
Admission: RE | Admit: 2016-02-12 | Discharge: 2016-02-12 | Disposition: A | Discharge status: Home / Self Care | Payer: Medicare Other | Source: Ambulatory Visit | Attending: Family Medicine | Admitting: Family Medicine

## 2016-02-12 ENCOUNTER — Ambulatory Visit (INDEPENDENT_AMBULATORY_CARE_PROVIDER_SITE_OTHER): Payer: Medicare Other | Admitting: Family Medicine

## 2016-02-12 ENCOUNTER — Encounter: Payer: Self-pay | Admitting: Family Medicine

## 2016-02-12 VITALS — BP 160/90 | HR 100 | Temp 98.4°F | Resp 20

## 2016-02-12 DIAGNOSIS — M25551 Pain in right hip: Secondary | ICD-10-CM

## 2016-02-12 MED ORDER — HYDROCODONE-ACETAMINOPHEN 5-325 MG PO TABS: 1.0000 | ORAL_TABLET | Freq: Four times a day (QID) | ORAL | 0 refills | Status: DC | PRN

## 2016-02-12 NOTE — Patient Instructions (Signed)
[facility] [address] for right hip Xray  

## 2016-02-12 NOTE — Progress Notes (Signed)
[name] Female    DOB: 09-06-1929   82 y.o.   MRN: [id number] Visit Date: 02/12/2016  Today's Provider: Mila Merryonald Chord Takahashi, MD   Chief Complaint  Patient presents with  . Hip Pain   Subjective:    Hip Pain   The incident occurred more than 1 week ago. There was no injury mechanism. The pain is present in the right hip. The pain is at a severity of 5/10. The pain is moderate. The pain has been constant since onset. Associated symptoms include an inability to bear weight and muscle weakness. The symptoms are aggravated by movement and weight bearing. She has tried NSAIDs for the symptoms. The treatment provided mild relief.       Allergies  Allergen Reactions  . Shellfish Allergy   . Codeine     headache  . Prednisone     Altered Mental Status  . Tetracycline    Current Meds  Medication Sig  . albuterol (PROVENTIL HFA;VENTOLIN HFA) 108 (90 Base) MCG/ACT inhaler Inhale 2 puffs into the lungs every 4 (four) hours as needed for wheezing or shortness of breath.  Marland Kitchen. arformoterol (BROVANA) 15 MCG/2ML NEBU Take 2 mLs (15 mcg total) by nebulization 2 (two) times daily.  . cholecalciferol (VITAMIN D) 1000 UNITS tablet Take 1,000 Units by mouth daily.  . dorzolamide (TRUSOPT) 2 % ophthalmic solution   . latanoprost (XALATAN) 0.005 % ophthalmic solution Place 1 drop into both eyes at bedtime.  . Multiple Vitamins-Minerals (CENTRUM SILVER ADULT 50+ PO) Take 1 tablet by mouth daily.  . OXYGEN Inhale into the lungs.  . tiotropium (SPIRIVA) 18 MCG inhalation capsule Place 1 capsule (18 mcg total) into inhaler and inhale daily.    Review of Systems  Constitutional: Negative.   Musculoskeletal: Positive for arthralgias, back pain, gait problem and myalgias.    Social History  Substance Use Topics  . Smoking status: Former Smoker    Packs/day: 1.00    Years: 50.00    Types: Cigarettes    Quit date: 06/30/2000  . Smokeless tobacco: Never Used  . Alcohol use No    Objective:   BP (!) 160/90 (BP Location: Left Arm, Patient Position: Sitting, Cuff Size: Normal)   Pulse 100   Temp 98.4 F (36.9 C) (Oral)   Resp 20   SpO2 97% Comment: 2L O2  Physical Exam  General appearance: alert, well developed, well nourished, cooperative and in no distress Head: Normocephalic, without obvious abnormality, atraumatic Respiratory: Respirations even and unlabored, normal respiratory rate Extremities: No gross deformities MS: Very tender along lateral aspect of right hip. Pain exacerbated by standing and rotation of right hip. No gross deformities.   Dg Hip Unilat With Pelvis 2-3 Views Right  Result Date: 02/13/2016 CLINICAL DATA:  Posterior right hip pain radiating anteriorly for the past 2-3 weeks with no known injury. Painful to walk or bear weight. History of right hip surgery. EXAM: DG HIP (WITH OR WITHOUT PELVIS) 2-3V RIGHT COMPARISON:  KUB of January 04, 2010 and postoperative pelvis and right hip images of April 20, 2007 FINDINGS: The patient has undergone previous ORIF for an intertrochanteric fracture of the right hip. The appearance of the metallic hardware is stable. There is a metallic structure resembling a headaches nut that lies adjacent to the distal aspect of the femoral rod but is visible one view only. This may lie external to the patient. It was not present on the immediate postoperative images in 2008.  No acute native bone fracture is observed. The bones are diffusely osteopenic. There is prominence of the lesser trochanteric region where heterotopic bone has formed. The hip joint space exhibits mild symmetric narrowing. The observed portions of the right hemipelvis are normal. IMPRESSION: 1. No acute re-fracture of the right hip is observed. The metallic hardware is intact. No evidence of loosening or infection. Increased prominence of heterotopic bone adjacent to or arising from the lesser trochanter. 2. Mild symmetric narrowing of the hip joint space  not greatly changed from the 2008 study. Electronically Signed   By: David  SwazilandJordan M.D.   On: 02/13/2016 08:42       Assessment & Plan:     1. Right hip pain No sign of fracture on XR. Likely right hip strain or bursitis. Call for orthopedics referral if not greatly improved in 2-3 days.   - HYDROcodone-acetaminophen (NORCO/VICODIN) 5-325 MG tablet; Take 1 tablet by mouth every 6 (six) hours as needed for moderate pain.  Dispense: 30 tablet; Refill: 0 - DG HIP UNILAT WITH PELVIS 2-3 VIEWS RIGHT; Future     The entirety of the information documented in the History of Present Illness, Review of Systems and Physical Exam were personally obtained by me. Portions of this information were initially documented by Rondel BatonSulibeya Dimas, CMA and reviewed by me for thoroughness and accuracy.    Mila Merryonald Walida Cajas, MD  [facility]

## 2016-04-04 ENCOUNTER — Inpatient Hospital Stay (HOSPITAL_COMMUNITY)
Admit: 2016-04-04 | Discharge: 2016-04-04 | Disposition: A | Discharge status: Home / Self Care | Payer: Medicare Other | Attending: Internal Medicine | Admitting: Internal Medicine

## 2016-04-04 ENCOUNTER — Inpatient Hospital Stay
Admission: EM | Admit: 2016-04-04 | Discharge: 2016-04-08 | DRG: 557 | Disposition: A | Discharge status: Home Health | Payer: Medicare Other | Attending: Internal Medicine | Admitting: Internal Medicine

## 2016-04-04 ENCOUNTER — Emergency Department: Payer: Medicare Other

## 2016-04-04 DIAGNOSIS — R748 Abnormal levels of other serum enzymes: Secondary | ICD-10-CM

## 2016-04-04 DIAGNOSIS — Z87891 Personal history of nicotine dependence: Secondary | ICD-10-CM

## 2016-04-04 DIAGNOSIS — M6282 Rhabdomyolysis: Principal | ICD-10-CM

## 2016-04-04 DIAGNOSIS — D649 Anemia, unspecified: Secondary | ICD-10-CM

## 2016-04-04 DIAGNOSIS — Z79899 Other long term (current) drug therapy: Secondary | ICD-10-CM | POA: Diagnosis not present

## 2016-04-04 DIAGNOSIS — I214 Non-ST elevation (NSTEMI) myocardial infarction: Secondary | ICD-10-CM | POA: Diagnosis present

## 2016-04-04 DIAGNOSIS — G9340 Encephalopathy, unspecified: Secondary | ICD-10-CM | POA: Diagnosis present

## 2016-04-04 DIAGNOSIS — K819 Cholecystitis, unspecified: Secondary | ICD-10-CM | POA: Diagnosis present

## 2016-04-04 DIAGNOSIS — H409 Unspecified glaucoma: Secondary | ICD-10-CM | POA: Diagnosis present

## 2016-04-04 DIAGNOSIS — R000 Tachycardia, unspecified: Secondary | ICD-10-CM | POA: Diagnosis not present

## 2016-04-04 DIAGNOSIS — J449 Chronic obstructive pulmonary disease, unspecified: Secondary | ICD-10-CM | POA: Diagnosis present

## 2016-04-04 DIAGNOSIS — R109 Unspecified abdominal pain: Secondary | ICD-10-CM

## 2016-04-04 DIAGNOSIS — Z9981 Dependence on supplemental oxygen: Secondary | ICD-10-CM

## 2016-04-04 DIAGNOSIS — M6281 Muscle weakness (generalized): Secondary | ICD-10-CM

## 2016-04-04 DIAGNOSIS — E860 Dehydration: Secondary | ICD-10-CM | POA: Diagnosis present

## 2016-04-04 DIAGNOSIS — W19XXXA Unspecified fall, initial encounter: Secondary | ICD-10-CM

## 2016-04-04 DIAGNOSIS — R778 Other specified abnormalities of plasma proteins: Secondary | ICD-10-CM

## 2016-04-04 DIAGNOSIS — J9610 Chronic respiratory failure, unspecified whether with hypoxia or hypercapnia: Secondary | ICD-10-CM | POA: Diagnosis present

## 2016-04-04 DIAGNOSIS — I2510 Atherosclerotic heart disease of native coronary artery without angina pectoris: Secondary | ICD-10-CM

## 2016-04-04 DIAGNOSIS — D72829 Elevated white blood cell count, unspecified: Secondary | ICD-10-CM

## 2016-04-04 DIAGNOSIS — R7989 Other specified abnormal findings of blood chemistry: Secondary | ICD-10-CM

## 2016-04-04 DIAGNOSIS — R262 Difficulty in walking, not elsewhere classified: Secondary | ICD-10-CM

## 2016-04-04 DIAGNOSIS — R680 Hypothermia, not associated with low environmental temperature: Secondary | ICD-10-CM | POA: Diagnosis present

## 2016-04-04 HISTORY — DX: Chronic respiratory failure, unspecified whether with hypoxia or hypercapnia: J96.10

## 2016-04-04 LAB — URINALYSIS COMPLETE WITH MICROSCOPIC (ARMC ONLY)
BACTERIA UA: NONE SEEN
BILIRUBIN URINE: NEGATIVE
GLUCOSE, UA: NEGATIVE mg/dL
LEUKOCYTES UA: NEGATIVE
NITRITE: NEGATIVE
PH: 5 (ref 5.0–8.0)
Protein, ur: 30 mg/dL — AB
SPECIFIC GRAVITY, URINE: 1.02 (ref 1.005–1.030)

## 2016-04-04 LAB — APTT: aPTT: 137 seconds — ABNORMAL HIGH (ref 24–36)

## 2016-04-04 LAB — CBC WITH DIFFERENTIAL/PLATELET
BASOS ABS: 0 10*3/uL (ref 0–0.1)
BASOS PCT: 0 %
EOS ABS: 0 10*3/uL (ref 0–0.7)
Eosinophils Relative: 0 %
HEMATOCRIT: 43 % (ref 35.0–47.0)
HEMOGLOBIN: 13.8 g/dL (ref 12.0–16.0)
Lymphocytes Relative: 2 %
Lymphs Abs: 0.4 10*3/uL — ABNORMAL LOW (ref 1.0–3.6)
MCH: 30.3 pg (ref 26.0–34.0)
MCHC: 32 g/dL (ref 32.0–36.0)
MCV: 94.9 fL (ref 80.0–100.0)
Monocytes Absolute: 1 10*3/uL — ABNORMAL HIGH (ref 0.2–0.9)
Monocytes Relative: 5 %
NEUTROS ABS: 17.2 10*3/uL — AB (ref 1.4–6.5)
NEUTROS PCT: 93 %
Platelets: 178 10*3/uL (ref 150–440)
RBC: 4.54 MIL/uL (ref 3.80–5.20)
RDW: 14.5 % (ref 11.5–14.5)
WBC: 18.7 10*3/uL — AB (ref 3.6–11.0)

## 2016-04-04 LAB — COMPREHENSIVE METABOLIC PANEL
ALBUMIN: 4.4 g/dL (ref 3.5–5.0)
ALK PHOS: 71 U/L (ref 38–126)
ALT: 26 U/L (ref 14–54)
ANION GAP: 11 (ref 5–15)
AST: 55 U/L — AB (ref 15–41)
BILIRUBIN TOTAL: 1 mg/dL (ref 0.3–1.2)
BUN: 27 mg/dL — AB (ref 6–20)
CALCIUM: 8.9 mg/dL (ref 8.9–10.3)
CO2: 24 mmol/L (ref 22–32)
Chloride: 106 mmol/L (ref 101–111)
Creatinine, Ser: 1.03 mg/dL — ABNORMAL HIGH (ref 0.44–1.00)
GFR calc Af Amer: 56 mL/min — ABNORMAL LOW (ref >60)
GFR calc non Af Amer: 49 mL/min — ABNORMAL LOW (ref >60)
GLUCOSE: 160 mg/dL — AB (ref 65–99)
Potassium: 4.2 mmol/L (ref 3.5–5.1)
SODIUM: 141 mmol/L (ref 135–145)
Total Protein: 7.2 g/dL (ref 6.5–8.1)

## 2016-04-04 LAB — MAGNESIUM: MAGNESIUM: 2.1 mg/dL (ref 1.7–2.4)

## 2016-04-04 LAB — CK: Total CK: 1363 U/L — ABNORMAL HIGH (ref 38–234)

## 2016-04-04 LAB — PROTIME-INR
INR: 1.04
Prothrombin Time: 13.6 seconds (ref 11.4–15.2)

## 2016-04-04 LAB — TROPONIN I
Troponin I: 2.3 ng/mL (ref <0.03)
Troponin I: 2.41 ng/mL (ref <0.03)

## 2016-04-04 LAB — LACTIC ACID, PLASMA
LACTIC ACID, VENOUS: 2.5 mmol/L — AB (ref 0.5–1.9)
Lactic Acid, Venous: 1.7 mmol/L (ref 0.5–1.9)

## 2016-04-04 LAB — GLUCOSE, CAPILLARY: GLUCOSE-CAPILLARY: 155 mg/dL — AB (ref 65–99)

## 2016-04-04 MED ORDER — ONDANSETRON HCL 4 MG PO TABS: 4.0000 mg | ORAL_TABLET | Freq: Four times a day (QID) | ORAL | Status: DC | PRN

## 2016-04-04 MED ORDER — ASPIRIN EC 81 MG PO TBEC
81.0000 mg | DELAYED_RELEASE_TABLET | Freq: Every day | ORAL | Status: DC
  Administered 2016-04-05 – 2016-04-08 (×4): 81 mg via ORAL
  Filled 2016-04-04 (×4): qty 1

## 2016-04-04 MED ORDER — LATANOPROST 0.005 % OP SOLN
1.0000 [drp] | Freq: Every day | OPHTHALMIC | Status: DC
  Administered 2016-04-04 – 2016-04-07 (×4): 1 [drp] via OPHTHALMIC
  Filled 2016-04-04: qty 2.5

## 2016-04-04 MED ORDER — VITAMIN D 1000 UNITS PO TABS
1000.0000 [IU] | ORAL_TABLET | Freq: Every day | ORAL | Status: DC
  Administered 2016-04-05 – 2016-04-08 (×4): 1000 [IU] via ORAL
  Filled 2016-04-04 (×4): qty 1

## 2016-04-04 MED ORDER — POLYETHYLENE GLYCOL 3350 17 G PO PACK
17.0000 g | PACK | Freq: Every day | ORAL | Status: DC | PRN
Start: 2016-04-04 — End: 2016-04-08

## 2016-04-04 MED ORDER — SODIUM CHLORIDE 0.9 % IV BOLUS (SEPSIS)
1000.0000 mL | Freq: Once | INTRAVENOUS | Status: AC
  Administered 2016-04-04: 1000 mL via INTRAVENOUS

## 2016-04-04 MED ORDER — ENOXAPARIN SODIUM 40 MG/0.4ML ~~LOC~~ SOLN: 40.0000 mg | SUBCUTANEOUS | Status: DC

## 2016-04-04 MED ORDER — DOCUSATE SODIUM 100 MG PO CAPS
100.0000 mg | ORAL_CAPSULE | Freq: Two times a day (BID) | ORAL | Status: DC
  Administered 2016-04-04 – 2016-04-08 (×7): 100 mg via ORAL
  Filled 2016-04-04 (×7): qty 1

## 2016-04-04 MED ORDER — SODIUM CHLORIDE 0.9 % IV SOLN
INTRAVENOUS | Status: DC
  Administered 2016-04-04 – 2016-04-05 (×2): via INTRAVENOUS

## 2016-04-04 MED ORDER — ASPIRIN 81 MG PO CHEW
CHEWABLE_TABLET | ORAL | Status: AC
  Administered 2016-04-04: 324 mg via ORAL
  Filled 2016-04-04: qty 4

## 2016-04-04 MED ORDER — ACETAMINOPHEN 325 MG PO TABS
650.0000 mg | ORAL_TABLET | Freq: Four times a day (QID) | ORAL | Status: DC | PRN
  Administered 2016-04-04 – 2016-04-05 (×3): 650 mg via ORAL
  Filled 2016-04-04 (×2): qty 2

## 2016-04-04 MED ORDER — PIPERACILLIN-TAZOBACTAM 3.375 G IVPB 30 MIN
3.3750 g | Freq: Once | INTRAVENOUS | Status: AC
  Administered 2016-04-04: 3.375 g via INTRAVENOUS
  Filled 2016-04-04: qty 50

## 2016-04-04 MED ORDER — HEPARIN BOLUS VIA INFUSION
3000.0000 [IU] | Freq: Once | INTRAVENOUS | Status: AC
  Administered 2016-04-04: 3000 [IU] via INTRAVENOUS
  Filled 2016-04-04: qty 3000

## 2016-04-04 MED ORDER — ATORVASTATIN CALCIUM 20 MG PO TABS
40.0000 mg | ORAL_TABLET | Freq: Every day | ORAL | Status: DC
  Administered 2016-04-05 – 2016-04-07 (×3): 40 mg via ORAL
  Filled 2016-04-04 (×3): qty 2

## 2016-04-04 MED ORDER — ARFORMOTEROL TARTRATE 15 MCG/2ML IN NEBU
15.0000 ug | INHALATION_SOLUTION | Freq: Two times a day (BID) | RESPIRATORY_TRACT | Status: DC
  Administered 2016-04-04: 15 ug via RESPIRATORY_TRACT
  Filled 2016-04-04 (×2): qty 2

## 2016-04-04 MED ORDER — VANCOMYCIN HCL IN DEXTROSE 1-5 GM/200ML-% IV SOLN
1000.0000 mg | Freq: Once | INTRAVENOUS | Status: AC
  Administered 2016-04-04: 1000 mg via INTRAVENOUS
  Filled 2016-04-04: qty 200

## 2016-04-04 MED ORDER — NITROGLYCERIN 0.4 MG SL SUBL
0.4000 mg | SUBLINGUAL_TABLET | SUBLINGUAL | Status: DC | PRN
  Administered 2016-04-04 – 2016-04-06 (×2): 0.4 mg via SUBLINGUAL
  Filled 2016-04-04: qty 1

## 2016-04-04 MED ORDER — IPRATROPIUM-ALBUTEROL 0.5-2.5 (3) MG/3ML IN SOLN
3.0000 mL | Freq: Once | RESPIRATORY_TRACT | Status: AC
  Administered 2016-04-04: 3 mL via RESPIRATORY_TRACT
  Filled 2016-04-04: qty 3

## 2016-04-04 MED ORDER — ALBUTEROL SULFATE (2.5 MG/3ML) 0.083% IN NEBU
2.5000 mg | INHALATION_SOLUTION | RESPIRATORY_TRACT | Status: DC | PRN
  Administered 2016-04-04: 2.5 mg via RESPIRATORY_TRACT

## 2016-04-04 MED ORDER — ASPIRIN 81 MG PO CHEW
324.0000 mg | CHEWABLE_TABLET | Freq: Once | ORAL | Status: AC
Start: 2016-04-04 — End: 2016-04-04
  Administered 2016-04-04: 324 mg via ORAL

## 2016-04-04 MED ORDER — HEPARIN (PORCINE) IN NACL 100-0.45 UNIT/ML-% IJ SOLN
800.0000 [IU]/h | INTRAMUSCULAR | Status: DC
  Administered 2016-04-04: 700 [IU]/h via INTRAVENOUS
  Filled 2016-04-04 (×2): qty 250

## 2016-04-04 MED ORDER — ALUM & MAG HYDROXIDE-SIMETH 200-200-20 MG/5ML PO SUSP
15.0000 mL | Freq: Once | ORAL | Status: AC
  Administered 2016-04-04: 15 mL via ORAL
  Filled 2016-04-04: qty 30

## 2016-04-04 MED ORDER — ALBUTEROL SULFATE (2.5 MG/3ML) 0.083% IN NEBU
5.0000 mg | INHALATION_SOLUTION | Freq: Once | RESPIRATORY_TRACT | Status: AC
  Administered 2016-04-04: 5 mg via RESPIRATORY_TRACT
  Filled 2016-04-04: qty 6

## 2016-04-04 MED ORDER — DORZOLAMIDE HCL 2 % OP SOLN
1.0000 [drp] | Freq: Two times a day (BID) | OPHTHALMIC | Status: DC
  Administered 2016-04-04 – 2016-04-08 (×8): 1 [drp] via OPHTHALMIC
  Filled 2016-04-04: qty 10

## 2016-04-04 MED ORDER — PIPERACILLIN-TAZOBACTAM 3.375 G IVPB
3.3750 g | Freq: Three times a day (TID) | INTRAVENOUS | Status: DC
  Administered 2016-04-04 – 2016-04-05 (×2): 3.375 g via INTRAVENOUS
  Filled 2016-04-04 (×2): qty 50

## 2016-04-04 MED ORDER — ACETAMINOPHEN 325 MG PO TABS
ORAL_TABLET | ORAL | Status: AC
  Administered 2016-04-04: 650 mg via ORAL
  Filled 2016-04-04: qty 2

## 2016-04-04 MED ORDER — IBUPROFEN 400 MG PO TABS: 400.0000 mg | ORAL_TABLET | Freq: Four times a day (QID) | ORAL | Status: DC | PRN

## 2016-04-04 MED ORDER — FENTANYL CITRATE (PF) 100 MCG/2ML IJ SOLN
25.0000 ug | Freq: Once | INTRAMUSCULAR | Status: AC
Start: 2016-04-04 — End: 2016-04-04
  Administered 2016-04-04: 25 ug via INTRAVENOUS
  Filled 2016-04-04: qty 2

## 2016-04-04 MED ORDER — ACETAMINOPHEN 650 MG RE SUPP: 650.0000 mg | Freq: Four times a day (QID) | RECTAL | Status: DC | PRN

## 2016-04-04 MED ORDER — NITROGLYCERIN 0.4 MG SL SUBL
SUBLINGUAL_TABLET | SUBLINGUAL | Status: AC
  Administered 2016-04-04: 22:00:00
  Filled 2016-04-04: qty 1

## 2016-04-04 MED ORDER — ALBUTEROL SULFATE (2.5 MG/3ML) 0.083% IN NEBU
2.5000 mg | INHALATION_SOLUTION | RESPIRATORY_TRACT | Status: DC | PRN
  Administered 2016-04-07: 2.5 mg via RESPIRATORY_TRACT
  Filled 2016-04-04 (×2): qty 3

## 2016-04-04 MED ORDER — ONDANSETRON HCL 4 MG/2ML IJ SOLN
4.0000 mg | Freq: Four times a day (QID) | INTRAMUSCULAR | Status: DC | PRN
  Administered 2016-04-05: 4 mg via INTRAVENOUS
  Filled 2016-04-04: qty 2

## 2016-04-04 MED ORDER — HYDROCODONE-ACETAMINOPHEN 5-325 MG PO TABS: 1.0000 | ORAL_TABLET | Freq: Four times a day (QID) | ORAL | Status: DC | PRN

## 2016-04-04 MED ORDER — ASPIRIN 300 MG RE SUPP: 300.0000 mg | Freq: Once | RECTAL | Status: DC

## 2016-04-04 NOTE — ED Triage Notes (Signed)
PT from home via EMS, reports she fell last night, EMS found her on the floor where she fell, pt states she was able to reach her phone and call 911. States she lives by herself. Reports back pain, no LOC

## 2016-04-04 NOTE — Progress Notes (Signed)
Doctor Anne HahnWillis informed about SVT and increased Heart rate.  Informed to keep monitor on the patient. Patient currently asymptomatic, asleep, NSR  In mid 80's on the monitor.   Katriona Schmierer ,RN

## 2016-04-04 NOTE — ED Notes (Signed)
Patient transported to CT 

## 2016-04-04 NOTE — ED Notes (Signed)
Patient transported to Ultrasound 

## 2016-04-04 NOTE — ED Notes (Signed)
EDP made aware of pts critical lactic acid and troponin levels.

## 2016-04-04 NOTE — Progress Notes (Signed)
ANTICOAGULATION CONSULT NOTE - Initial Consult  Pharmacy Consult for Heparin Drip Indication: chest pain/ACS- NSTEMI  Allergies  Allergen Reactions  . Shellfish Allergy   . Codeine     headache  . Prednisone     Altered Mental Status  . Tetracycline     Patient Measurements: Height: 5\' 3"  (160 cm) Weight: 130 lb 8.2 oz (59.2 kg) IBW/kg (Calculated) : 52.4  Vital Signs: Temp: 98.5 F (36.9 C) (10/06 1337) Temp Source: Rectal (10/06 1337) BP: 173/88 (10/06 1330) Pulse Rate: 94 (10/06 1330)  Labs:  Recent Labs  04/04/16 1252  HGB 13.8  HCT 43.0  PLT 178  LABPROT 13.6  INR 1.04  CREATININE 1.03*  CKTOTAL 1,363*  TROPONINI 2.30*    Estimated Creatinine Clearance: 33.6 mL/min (by C-G formula based on SCr of 1.03 mg/dL (H)).   Medical History: Past Medical History:  Diagnosis Date  . Back pain   . Chronic respiratory failure (HCC)   . COPD (chronic obstructive pulmonary disease) (HCC)   . Glaucoma   . Insomnia   . Nocturnal leg cramps   . Osteoarthritis     Assessment: 82 yo female with elevated troponin of 2.3 and possible NSTEMI. Pharmacy consulted for dosing and monitoring of Heparin.   Goal of Therapy:  Heparin level 0.3-0.7 units/ml Monitor platelets by anticoagulation protocol: Yes   Plan:  Give 3000 units bolus x 1 Start heparin infusion at 700 units/hr Check anti-Xa level in 8 hours and daily while on heparin Continue to monitor H&H and platelets  Cher NakaiSheema Euna Armon, PharmD Clinical Pharmacist 04/04/2016 3:20 PM

## 2016-04-04 NOTE — Progress Notes (Signed)
Notified by central telemetry around 21:00pm that pt. Heart rate went up to 150 bpm and pt. developed run of SVT. Immediately checked on patient. Pt was asymptomatic at this time, no acute distress noted or reported. Patient heart rate after that event was 89 bpm NSR. Will continue to monitor. Paged to inform the doctor. Awaiting on call back.   Yeshua Stryker, RN

## 2016-04-04 NOTE — Progress Notes (Addendum)
At approximately 2040, RN was called into the room because pt was complaining of shortness of breath. Oxygen saturations were 94, lungs sounded diminished. PRN breathing treatment was given. After administration of breathing treatment, Pt started complaining of chest pain. RN administrated 1 sublingual nitroglycerin. Pt states that nitroglycerin did not relieve her pain. MD Sudini at bedside. Per MD, the chest pain sounded more like indigestion. Verbal order given for 15 mg Maalox. RN placed order, and administered. Pt's primary nurse, Hiram GashJasmina, updated on situation, she will continue to monitor pt.  Mayra NeerNesbitt, Zamarah Ullmer M

## 2016-04-04 NOTE — ED Provider Notes (Signed)
[facility] Emergency Department Provider Note    None    (approximate)  I have reviewed the triage vital signs and the nursing notes.   HISTORY  Chief Complaint Fall    HPI [name] [name] is a 82 y.o. female presents from home after fall last night. Patient states she was going to the bathroom and tripped and fell backwards. Has been unable to walk since then. EMS went to the home today and found her laying on the floor. She's complaining of diffuse pain. Appears very dehydrated and encephalopathic. Denies any chest pain or pressure. Also complained of right upper quadrant pain.   Past Medical History:  Diagnosis Date  . Back pain   . COPD (chronic obstructive pulmonary disease) (HCC)   . Glaucoma   . Insomnia   . Nocturnal leg cramps   . Osteoarthritis     Patient Active Problem List   Diagnosis Date Noted  . Glaucoma 11/29/2014  . Insomnia 11/29/2014  . Arthritis, degenerative 11/29/2014  . Dependence on supplemental oxygen 11/29/2014  . 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 COPD (chronic obstructive pulmonary disease) (HCC) 11/29/2013  . Chronic hypoxemic respiratory failure (HCC) 11/29/2013    Past Surgical History:  Procedure Laterality Date  . APPENDECTOMY  1947  . BREAST SURGERY  1969   biopsy  . HIP FRACTURE SURGERY Right 2008  . VAGINAL HYSTERECTOMY  1975    Prior to Admission medications   Medication Sig Start Date End Date Taking? Authorizing Provider  albuterol (PROVENTIL HFA;VENTOLIN HFA) 108 (90 Base) MCG/ACT inhaler Inhale 2 puffs into the lungs every 4 (four) hours as needed for wheezing or shortness of breath. 12/25/15   Lorie Phenix, MD  arformoterol (BROVANA) 15 MCG/2ML NEBU Take 2 mLs (15 mcg total) by nebulization 2 (two) times daily. 12/25/15   Lorie Phenix, MD  cholecalciferol (VITAMIN D) 1000 UNITS tablet Take 1,000 Units by mouth daily.    Historical Provider, MD  dorzolamide (TRUSOPT) 2 % ophthalmic solution  10/31/14    Historical Provider, MD  HYDROcodone-acetaminophen (NORCO/VICODIN) 5-325 MG tablet Take 1 tablet by mouth every 6 (six) hours as needed for moderate pain. 02/12/16   Malva Limes, MD  latanoprost (XALATAN) 0.005 % ophthalmic solution Place 1 drop into both eyes at bedtime.    Historical Provider, MD  Multiple Vitamins-Minerals (CENTRUM SILVER ADULT 50+ PO) Take 1 tablet by mouth daily.    Historical Provider, MD  OXYGEN Inhale into the lungs.    Historical Provider, MD  tiotropium (SPIRIVA) 18 MCG inhalation capsule Place 1 capsule (18 mcg total) into inhaler and inhale daily. 04/26/15   Lorie Phenix, MD    Allergies Shellfish allergy; Codeine; Prednisone; and Tetracycline  Family History  Problem Relation Age of Onset  . Cancer Sister     cervical  . Cerebral aneurysm Father     reason for death    Social History Social History  Substance Use Topics  . Smoking status: Former Smoker    Packs/day: 1.00    Years: 50.00    Types: Cigarettes    Quit date: 06/30/2000  . Smokeless tobacco: Never Used  . Alcohol use No    Review of Systems Patient denies headaches, rhinorrhea, blurry vision, numbness, shortness of breath, chest pain, edema, cough, abdominal pain, nausea, vomiting, diarrhea, dysuria, fevers, rashes or hallucinations unless otherwise stated above in HPI. ____________________________________________   PHYSICAL EXAM:  VITAL SIGNS: Vitals:   04/04/16 1258 04/04/16 1337  BP: (!) 148/66  Pulse: 100   Resp: (!) 28   Temp: 97.1 F (36.2 C) 98.5 F (36.9 C)    Constitutional: Alert, Quickly ill-appearing. Eyes: Conjunctivae are normal. PERRL. EOMI. Head: Atraumatic. Nose: No congestion/rhinnorhea. Mouth/Throat: Mucous membranes are moist.  Oropharynx non-erythematous. Neck: No stridor. Painless ROM. No cervical spine tenderness to palpation Hematological/Lymphatic/Immunilogical: No cervical lymphadenopathy. Cardiovascular: Normal rate, regular rhythm. Grossly  normal heart sounds.  Good peripheral circulation. Respiratory: To.  No retractions. Coarse bibasilar breath sounds Gastrointestinal: Soft right upper quadrant tenderness palpation. No distention. No abdominal bruits. No CVA tenderness.  Musculoskeletal: No lower extremity tenderness nor edema. No pain with drop log roll or palpation of bilateral lower extremities. Does have scattered ecchymosis to all 4 extremities but no overt deformity. No joint effusions. Neurologic:  Normal speech and language. No gross focal neurologic deficits are appreciated. No gait instability. Skin:  Skin is warm, dry and intact. No rash noted. Psychiatric: Mood and affect are normal. Speech and behavior are normal.  ____________________________________________   LABS (all labs ordered are listed, but only abnormal results are displayed)  Results for orders placed or performed during the hospital encounter of 04/04/16 (from the past 24 hour(s))  Glucose, capillary     Status: Abnormal   Collection Time: 04/04/16 12:49 PM  Result Value Ref Range   Glucose-Capillary 155 (H) 65 - 99 mg/dL  Urinalysis complete, with microscopic ([facility] only)     Status: Abnormal   Collection Time: 04/04/16 12:51 PM  Result Value Ref Range   Color, Urine YELLOW (A) YELLOW   APPearance CLEAR (A) CLEAR   Glucose, UA NEGATIVE NEGATIVE mg/dL   Bilirubin Urine NEGATIVE NEGATIVE   Ketones, ur 2+ (A) NEGATIVE mg/dL   Specific Gravity, Urine 1.020 1.005 - 1.030   Hgb urine dipstick 1+ (A) NEGATIVE   pH 5.0 5.0 - 8.0   Protein, ur 30 (A) NEGATIVE mg/dL   Nitrite NEGATIVE NEGATIVE   Leukocytes, UA NEGATIVE NEGATIVE   RBC / HPF 0-5 0 - 5 RBC/hpf   WBC, UA 0-5 0 - 5 WBC/hpf   Bacteria, UA NONE SEEN NONE SEEN   Squamous Epithelial / LPF 0-5 (A) NONE SEEN   Mucous PRESENT   CBC with Differential/Platelet     Status: Abnormal   Collection Time: 04/04/16 12:52 PM  Result Value Ref Range   WBC 18.7 (H) 3.6 - 11.0 K/uL   RBC 4.54 3.80 -  5.20 MIL/uL   Hemoglobin 13.8 12.0 - 16.0 g/dL   HCT 16.1 09.6 - 04.5 %   MCV 94.9 80.0 - 100.0 fL   MCH 30.3 26.0 - 34.0 pg   MCHC 32.0 32.0 - 36.0 g/dL   RDW 40.9 81.1 - 91.4 %   Platelets 178 150 - 440 K/uL   Neutrophils Relative % 93 %   Neutro Abs 17.2 (H) 1.4 - 6.5 K/uL   Lymphocytes Relative 2 %   Lymphs Abs 0.4 (L) 1.0 - 3.6 K/uL   Monocytes Relative 5 %   Monocytes Absolute 1.0 (H) 0.2 - 0.9 K/uL   Eosinophils Relative 0 %   Eosinophils Absolute 0.0 0 - 0.7 K/uL   Basophils Relative 0 %   Basophils Absolute 0.0 0 - 0.1 K/uL  Comprehensive metabolic panel     Status: Abnormal   Collection Time: 04/04/16 12:52 PM  Result Value Ref Range   Sodium 141 135 - 145 mmol/L   Potassium 4.2 3.5 - 5.1 mmol/L   Chloride 106 101 - 111 mmol/L  CO2 24 22 - 32 mmol/L   Glucose, Bld 160 (H) 65 - 99 mg/dL   BUN 27 (H) 6 - 20 mg/dL   Creatinine, Ser 1.61 (H) 0.44 - 1.00 mg/dL   Calcium 8.9 8.9 - 09.6 mg/dL   Total Protein 7.2 6.5 - 8.1 g/dL   Albumin 4.4 3.5 - 5.0 g/dL   AST 55 (H) 15 - 41 U/L   ALT 26 14 - 54 U/L   Alkaline Phosphatase 71 38 - 126 U/L   Total Bilirubin 1.0 0.3 - 1.2 mg/dL   GFR calc non Af Amer 49 (L) >60 mL/min   GFR calc Af Amer 56 (L) >60 mL/min   Anion gap 11 5 - 15  Troponin I     Status: Abnormal   Collection Time: 04/04/16 12:52 PM  Result Value Ref Range   Troponin I 2.30 (HH) <0.03 ng/mL  CK     Status: Abnormal   Collection Time: 04/04/16 12:52 PM  Result Value Ref Range   Total CK 1,363 (H) 38 - 234 U/L  Lactic acid, plasma     Status: Abnormal   Collection Time: 04/04/16 12:52 PM  Result Value Ref Range   Lactic Acid, Venous 2.5 (HH) 0.5 - 1.9 mmol/L   ____________________________________________  EKG My review and personal interpretation at Time: 13:32   Indication: fall  Rate: 90  Rhythm: nsr Axis: normal Other: no acute ischemia ____________________________________________  RADIOLOGY  .I personally reviewed all radiographic images  ordered to evaluate for the above acute complaints and reviewed radiology reports and findings.  These findings were personally discussed with the patient.  Please see medical record for radiology report.  ____________________________________________   PROCEDURES  Procedure(s) performed: none    Critical Care performed: yes CRITICAL CARE Performed by: Willy Eddy   Total critical care time: 40 minutes  Critical care time was exclusive of separately billable procedures and treating other patients.  Critical care was necessary to treat or prevent imminent or life-threatening deterioration.  Critical care was time spent personally by me on the following activities: development of treatment plan with patient and/or surrogate as well as nursing, discussions with consultants, evaluation of patient's response to treatment, examination of patient, obtaining history from patient or surrogate, ordering and performing treatments and interventions, ordering and review of laboratory studies, ordering and review of radiographic studies, pulse oximetry and re-evaluation of patient's condition.  ____________________________________________   INITIAL IMPRESSION / ASSESSMENT AND PLAN / ED COURSE  Pertinent labs & imaging results that were available during my care of the patient were reviewed by me and considered in my medical decision making (see chart for details).  DDX: Sepsis, heart failure, traumatic injury, cholecystitis, dehydration, bleed  TERIE LEAR is a 82 y.o. who presents to the ED with a fall and being found down on the floor for prolonged periods of time. Patient arrives appearing acutely ill, mildly hypothermic with tachypnea. No acute hypoxia. Patient appears profoundly dehydrated on exam. Also with right upper quadrant tenderness to palpation concerning for acute cholecystitis. Patient denies any chest pain. EKG without any evidence of acute ischemia. Will order CT evaluation  of head to evaluate for acute abnormality. We'll order a right upper quadrant ultrasound to evaluate for biliary pathology. We'll give aggressive IV fluid resuscitation.  The patient will be placed on continuous pulse oximetry and telemetry for monitoring.  Laboratory evaluation will be sent to evaluate for the above complaints.     Clinical Course  Comment By  Time  Patient to ultrasound as I am concerned for cholecystitis based on her right upper quadrant pain. Willy EddyPatrick Keria Widrig, MD 10/06 1345  Ultrasound shows evidence of probable acalculous cholecystitis. Patient denies any chest pain at this time. On further conversation with the patient's grandson now at bedside patient may have fallen as early as Monday of this week. Willy EddyPatrick Mariyah Upshaw, MD 10/06 1443  ----------------------------------------- 3:51 PM on 04/04/2016 -----------------------------------------  Patient given broad-spectrum antibiotics due to concern for cholecystitis. Patient with elevated troponin there is no evidence of acute ischemia on EKG the patient is chest pain-free. Patient may have had a primary cardiac event causing the fall or this could be secondary to demand ischemia in the setting of sepsis or muscle breakdown. Patient has been given 2 L of normal saline resuscitation.  Have discussed with the patient and available family all diagnostics and treatments performed thus far and all questions were answered to the best of my ability. The patient demonstrates understanding and agreement with plan.    ____________________________________________   FINAL CLINICAL IMPRESSION(S) / ED DIAGNOSES  Final diagnoses:  Abdominal pain  Elevated troponin  Dehydration  Leukocytosis, unspecified type  Elevated CK      NEW MEDICATIONS STARTED DURING THIS VISIT:  New Prescriptions   No medications on file     Note:  This document was prepared using Dragon voice recognition software and may include unintentional dictation  errors.    Willy EddyPatrick Shamanda Len, MD 04/04/16 [phone]

## 2016-04-04 NOTE — Progress Notes (Signed)
Pharmacy Antibiotic Note  [name] [name] is a 82 y.o. female admitted on 04/04/2016 with Intra-abdominal infection.  Pharmacy has been consulted for Zosyn dosing. Patient received one dose of vancomycin 1gm IV and Zosyn 3.375 in ED.   Plan: Start patient on Zosyn 3.375 IV EI every 8 hours.   Height: 5\' 3"  (160 cm) Weight: 130 lb 8.2 oz (59.2 kg) IBW/kg (Calculated) : 52.4  Temp (24hrs), Avg:97.8 F (36.6 C), Min:97.1 F (36.2 C), Max:98.5 F (36.9 C)   Recent Labs Lab 04/04/16 1252  WBC 18.7*  CREATININE 1.03*  LATICACIDVEN 2.5*    Estimated Creatinine Clearance: 33.6 mL/min (by C-G formula based on SCr of 1.03 mg/dL (H)).    Allergies  Allergen Reactions  . Shellfish Allergy   . Codeine     headache  . Prednisone     Altered Mental Status  . Tetracycline     Antimicrobials this admission: 10/6 vancomycin >> 10/6 10/6 Zosyn  >>   Dose adjustments this admission:  Microbiology results: BCx:  UCx:  Sputum:  MRSA PCR:  Thank you for allowing pharmacy to be a part of this patient's care.  Cher NakaiSheema Denarius Sesler, PharmD Clinical Pharmacist 04/04/2016 3:17 PM

## 2016-04-04 NOTE — H&P (Signed)
[facility] [place] [facility]   PATIENT NAME: [name] [name]    MR#:  [id number]  DATE OF BIRTH:  05-03-1930  DATE OF ADMISSION:  04/04/2016  PRIMARY CARE PHYSICIAN: Mila Merryonald Fisher, MD   REQUESTING/REFERRING PHYSICIAN: Dr. Roxan Hockeyobinson  CHIEF COMPLAINT:   Chief Complaint  Patient presents with  . Fall    HISTORY OF PRESENT ILLNESS:  [name] Peeksther Caban  is a 82 y.o. female with a known history of COPD, decreased hearing. On presents to the emergency room due to a fall yesterday. It is unclear when patient fell as she feels today's Monday. Patient was laying on the floor to weak to get up and called EMS after dragging herself to the phone. No one has seen her in the last few days. Here patient has been found to have mild rhabdomyolysis with CK of 1300 but significantly elevated troponin at 2.3. He shows no acute changes. X-ray of the pelvis showed no fractures. She had some mild tenderness on the right upper quadrant area and an ultrasound showed a calculus cholecystitis. Normal liver enzymes and bilirubin.  Patient's grandson is at the bedside and is the only living relative.  PAST MEDICAL HISTORY:   Past Medical History:  Diagnosis Date  . Back pain   . Chronic respiratory failure (HCC)   . COPD (chronic obstructive pulmonary disease) (HCC)   . Glaucoma   . Insomnia   . Nocturnal leg cramps   . Osteoarthritis     PAST SURGICAL HISTORY:   Past Surgical History:  Procedure Laterality Date  . APPENDECTOMY  1947  . BREAST SURGERY  1969   biopsy  . HIP FRACTURE SURGERY Right 2008  . VAGINAL HYSTERECTOMY  1975    SOCIAL HISTORY:   Social History  Substance Use Topics  . Smoking status: Former Smoker    Packs/day: 1.00    Years: 50.00    Types: Cigarettes    Quit date: 06/30/2000  . Smokeless tobacco: Never Used  . Alcohol use No    FAMILY HISTORY:   Family History  Problem Relation Age of Onset  . Cancer Sister     cervical  . Cerebral  aneurysm Father     reason for death    DRUG ALLERGIES:   Allergies  Allergen Reactions  . Shellfish Allergy   . Codeine     headache  . Prednisone     Altered Mental Status  . Tetracycline     REVIEW OF SYSTEMS:   Review of Systems  Unable to perform ROS: Mental status change    MEDICATIONS AT HOME:   Prior to Admission medications   Medication Sig Start Date End Date Taking? Authorizing Provider  albuterol (PROVENTIL HFA;VENTOLIN HFA) 108 (90 Base) MCG/ACT inhaler Inhale 2 puffs into the lungs every 4 (four) hours as needed for wheezing or shortness of breath. 12/25/15   Lorie PhenixNancy Maloney, MD  arformoterol (BROVANA) 15 MCG/2ML NEBU Take 2 mLs (15 mcg total) by nebulization 2 (two) times daily. 12/25/15   Lorie PhenixNancy Maloney, MD  cholecalciferol (VITAMIN D) 1000 UNITS tablet Take 1,000 Units by mouth daily.    Historical Provider, MD  dorzolamide (TRUSOPT) 2 % ophthalmic solution  10/31/14   Historical Provider, MD  HYDROcodone-acetaminophen (NORCO/VICODIN) 5-325 MG tablet Take 1 tablet by mouth every 6 (six) hours as needed for moderate pain. 02/12/16   Malva Limesonald E Fisher, MD  latanoprost (XALATAN) 0.005 % ophthalmic solution Place 1 drop into both eyes at bedtime.  Historical Provider, MD  Multiple Vitamins-Minerals (CENTRUM SILVER ADULT 50+ PO) Take 1 tablet by mouth daily.    Historical Provider, MD  OXYGEN Inhale into the lungs.    Historical Provider, MD  tiotropium (SPIRIVA) 18 MCG inhalation capsule Place 1 capsule (18 mcg total) into inhaler and inhale daily. 04/26/15   Lorie Phenix, MD     VITAL SIGNS:  Blood pressure (!) 173/88, pulse 94, temperature 98.5 F (36.9 C), temperature source Rectal, resp. rate 20, height 5\' 3"  (1.6 m), weight 59.2 kg (130 lb 8.2 oz), SpO2 97 %.  PHYSICAL EXAMINATION:  Physical Exam  GENERAL:  82 y.o.-year-old patient lying in the bed with no acute distress. Decreased hearing EYES: Pupils equal, round, reactive to light and accommodation. No  scleral icterus. Extraocular muscles intact.  HEENT: Head atraumatic, normocephalic. Oropharynx and nasopharynx clear. No oropharyngeal erythema, moist oral mucosa  NECK:  Supple, no jugular venous distention. No thyroid enlargement, no tenderness.  LUNGS: Normal breath sounds bilaterally, no wheezing, rales, rhonchi. No use of accessory muscles of respiration.  CARDIOVASCULAR: S1, S2 normal. No murmurs, rubs, or gallops.  ABDOMEN: Soft, nontender, nondistended. Bowel sounds present. No organomegaly or mass.  EXTREMITIES: No pedal edema, cyanosis, or clubbing. + 2 pedal & radial pulses b/l.   NEUROLOGIC: Cranial nerves II through XII are intact. No focal Motor or sensory deficits appreciated b/l PSYCHIATRIC: The patient is alert and awake SKIN: No obvious rash, lesion, or ulcer.   LABORATORY PANEL:   CBC  Recent Labs Lab 04/04/16 1252  WBC 18.7*  HGB 13.8  HCT 43.0  PLT 178   ------------------------------------------------------------------------------------------------------------------  Chemistries   Recent Labs Lab 04/04/16 1252  NA 141  K 4.2  CL 106  CO2 24  GLUCOSE 160*  BUN 27*  CREATININE 1.03*  CALCIUM 8.9  MG 2.1  AST 55*  ALT 26  ALKPHOS 71  BILITOT 1.0   ------------------------------------------------------------------------------------------------------------------  Cardiac Enzymes  Recent Labs Lab 04/04/16 1252  TROPONINI 2.30*   ------------------------------------------------------------------------------------------------------------------  RADIOLOGY:  Dg Chest 1 View  Result Date: 04/04/2016 CLINICAL DATA:  Fall.  Altered mental status. EXAM: CHEST 1 VIEW COMPARISON:  08/24/2014 FINDINGS: Mid thoracic vertebral augmentations. Atherosclerotic aortic arch. Possible emphysema. Upper zone pulmonary vascular prominence. No pleural effusion. Bony demineralization. IMPRESSION: 1. Suspected emphysema. 2. Upper zone pulmonary vascular prominence  could reflect pulmonary venous hypertension. No overt edema. 3. Atherosclerotic aortic arch. 4. Mid thoracic vertebral augmentations; bony demineralization suggesting osteoporosis. Electronically Signed   By: Gaylyn Rong M.D.   On: 04/04/2016 13:48   Dg Pelvis 1-2 Views  Result Date: 04/04/2016 CLINICAL DATA:  COPD.  Fall last night, found down. EXAM: PELVIS - 1-2 VIEW COMPARISON:  02/12/2016 FINDINGS: Bony demineralization. Right femoral nail with helical hip screw. Moderate degenerative chondral thinning in both hips. No pelvic fracture identified, strict arm obscured by extensive stool in the rectosigmoid. Vascular calcifications. IMPRESSION: 1. No acute bony findings in the pelvis. 2. Right femoral nail with helical hip screw in place. Electronically Signed   By: Gaylyn Rong M.D.   On: 04/04/2016 13:50   Ct Head Wo Contrast  Result Date: 04/04/2016 CLINICAL DATA:  Altered mental status. Concern for intracranial hemorrhage. EXAM: CT HEAD WITHOUT CONTRAST TECHNIQUE: Contiguous axial images were obtained from the base of the skull through the vertex without intravenous contrast. COMPARISON:  None. FINDINGS: Brain: No definite evidence of acute infarction, hemorrhage, hydrocephalus, extra-axial collection or mass lesion/mass effect. There is advanced microangiopathy of the periventricular white matter.  Slightly asymmetric hypoattenuated appearance is seen in the right subcortical frontal lobe. This may represent asymmetric involvement with microangiopathy, however small age-indeterminate infarct cannot be entirely excluded. Vascular: No hyperdense vessel or unexpected calcification. Skull: Normal. Negative for fracture or focal lesion. Sinuses/Orbits: No acute finding. Other: None. IMPRESSION: Advance periventricular microangiopathy. Un area of asymmetric hypoattenuation in the subcortical right frontal lobe may represent asymmetric involvement with microangiopathy versus age-indeterminate  small infarct. No evidence of intracranial hemorrhage. Electronically Signed   By: Ted Mcalpine M.D.   On: 04/04/2016 13:24   US Abdomen Limited Ruq  Result Date: 04/04/2016 CLINICAL DATA:  Upper abdominal pain for 1 day EXAM: US ABDOMEN LIMITED - RIGHT UPPER QUADRANT COMPARISON:  None. FINDINGS: Gallbladder: The gallbladder is mildly distended with wall thickening and mild pericholecystic fluid. No gallstones are evident. No sonographic Murphy sign noted by sonographer. Common bile duct: Diameter: 7 mm, upper normal. No intrahepatic or extrahepatic biliary duct dilatation. Liver: No focal lesion identified. Within normal limits in parenchymal echogenicity. Right hepatic vein and inferior vena cava appear prominent. IMPRESSION: Findings concerning for a degree of acalculus cholecystitis. It may be reasonable to consider nuclear medicine hepatobiliary imaging study to assess for cystic duct patency. Prominence of the right hepatic vein and inferior vena cava. Question a degree of right heart failure. Electronically Signed   By: Bretta Bang III M.D.   On: 04/04/2016 14:16     IMPRESSION AND PLAN:   * NSTEMI Aspirin, statin. Start heparin drip. Check echocardiogram. Consult cardiology. Elevated troponin could be due to rhabdomyolysis or arrhythmias causing her fall.   * Rhabdomyolysis Due to fall. Start IV fluids. Monitor CK.  * Acaculous cholecystitis No significant tenderness. Normal liver enzymes. Start antibiotics. Blood cultures sent. Consult surgery. If patient needs cholecystectomy she will need cardiology clearance prior to surgery.  * Fall Etiology unclear. Could be cardiac event or mechanical fall. Poor historian. Monitor on telemetry.  * COPD with chronic respiratory failure Continue oxygen. Nebs when necessary.  * DVT prophylaxis with Lovenox  All the records are reviewed and case discussed with ED provider. Management plans discussed with the patient, family  and they are in agreement.  CODE STATUS: FULL CODE  TOTAL TIME TAKING CARE OF THIS PATIENT: 40 minutes.   Milagros Loll R M.D on 04/04/2016 at 3:04 PM  Between 7am to 6pm - Pager - [phone]  After 6pm go to [email or web address] - password [email or web address]  [facility]  Office  [phone]  CC: Primary care physician; Mila Merry, MD  Note: This dictation was prepared with Dragon dictation along with smaller phrase technology. Any transcriptional errors that result from this process are unintentional.

## 2016-04-05 ENCOUNTER — Inpatient Hospital Stay: Admit: 2016-04-05 | Payer: Medicare Other

## 2016-04-05 DIAGNOSIS — I214 Non-ST elevation (NSTEMI) myocardial infarction: Secondary | ICD-10-CM

## 2016-04-05 LAB — CBC
HCT: 34.2 % — ABNORMAL LOW (ref 35.0–47.0)
HEMOGLOBIN: 11.5 g/dL — AB (ref 12.0–16.0)
MCH: 31.3 pg (ref 26.0–34.0)
MCHC: 33.6 g/dL (ref 32.0–36.0)
MCV: 93.1 fL (ref 80.0–100.0)
Platelets: 141 10*3/uL — ABNORMAL LOW (ref 150–440)
RBC: 3.67 MIL/uL — ABNORMAL LOW (ref 3.80–5.20)
RDW: 14.6 % — ABNORMAL HIGH (ref 11.5–14.5)
WBC: 12.3 10*3/uL — AB (ref 3.6–11.0)

## 2016-04-05 LAB — HEPARIN LEVEL (UNFRACTIONATED)
HEPARIN UNFRACTIONATED: 0.28 [IU]/mL — AB (ref 0.30–0.70)
HEPARIN UNFRACTIONATED: 0.42 [IU]/mL (ref 0.30–0.70)
Heparin Unfractionated: 0.43 IU/mL (ref 0.30–0.70)

## 2016-04-05 LAB — COMPREHENSIVE METABOLIC PANEL
ALT: 23 U/L (ref 14–54)
ANION GAP: 6 (ref 5–15)
AST: 49 U/L — ABNORMAL HIGH (ref 15–41)
Albumin: 3.5 g/dL (ref 3.5–5.0)
Alkaline Phosphatase: 59 U/L (ref 38–126)
BUN: 20 mg/dL (ref 6–20)
CHLORIDE: 107 mmol/L (ref 101–111)
CO2: 23 mmol/L (ref 22–32)
CREATININE: 0.62 mg/dL (ref 0.44–1.00)
Calcium: 8.3 mg/dL — ABNORMAL LOW (ref 8.9–10.3)
GFR calc non Af Amer: >60 mL/min (ref >60)
Glucose, Bld: 127 mg/dL — ABNORMAL HIGH (ref 65–99)
Potassium: 4 mmol/L (ref 3.5–5.1)
SODIUM: 136 mmol/L (ref 135–145)
Total Bilirubin: 1 mg/dL (ref 0.3–1.2)
Total Protein: 5.5 g/dL — ABNORMAL LOW (ref 6.5–8.1)

## 2016-04-05 LAB — ECHOCARDIOGRAM COMPLETE
HEIGHTINCHES: 62 in
WEIGHTICAEL: 1998.4 [oz_av]

## 2016-04-05 LAB — INFLUENZA PANEL BY PCR (TYPE A & B)
H1N1 flu by pcr: NOT DETECTED
INFLAPCR: NEGATIVE
INFLBPCR: NEGATIVE

## 2016-04-05 LAB — CK: CK TOTAL: 822 U/L — AB (ref 38–234)

## 2016-04-05 LAB — TROPONIN I: Troponin I: 1.53 ng/mL (ref <0.03)

## 2016-04-05 MED ORDER — SODIUM CHLORIDE 0.9 % IV SOLN
INTRAVENOUS | Status: DC
  Administered 2016-04-05 – 2016-04-08 (×4): via INTRAVENOUS

## 2016-04-05 MED ORDER — ARFORMOTEROL TARTRATE 15 MCG/2ML IN NEBU
15.0000 ug | INHALATION_SOLUTION | Freq: Two times a day (BID) | RESPIRATORY_TRACT | Status: DC
  Administered 2016-04-05 – 2016-04-08 (×7): 15 ug via RESPIRATORY_TRACT
  Filled 2016-04-05 (×7): qty 2

## 2016-04-05 MED ORDER — ALUM & MAG HYDROXIDE-SIMETH 200-200-20 MG/5ML PO SUSP
15.0000 mL | Freq: Once | ORAL | Status: DC
  Filled 2016-04-05: qty 30

## 2016-04-05 MED ORDER — HEPARIN BOLUS VIA INFUSION
800.0000 [IU] | Freq: Once | INTRAVENOUS | Status: AC
  Administered 2016-04-05: 800 [IU] via INTRAVENOUS
  Filled 2016-04-05: qty 800

## 2016-04-05 MED ORDER — METOPROLOL TARTRATE 5 MG/5ML IV SOLN
5.0000 mg | INTRAVENOUS | Status: DC | PRN
  Administered 2016-04-05 – 2016-04-06 (×2): 5 mg via INTRAVENOUS
  Filled 2016-04-05 (×2): qty 5

## 2016-04-05 NOTE — Progress Notes (Signed)
ANTICOAGULATION CONSULT NOTE - Initial Consult  Pharmacy Consult for Heparin Drip Indication: chest pain/ACS- NSTEMI  Allergies  Allergen Reactions  . Shellfish Allergy   . Codeine     headache  . Prednisone     Altered Mental Status  . Tetracycline     Patient Measurements: Height: 5\' 2"  (157.5 cm) Weight: 126 lb 4.8 oz (57.3 kg) IBW/kg (Calculated) : 50.1  Vital Signs: Temp: 98.2 F (36.8 C) (10/07 0429) Temp Source: Oral (10/07 0429) BP: 163/73 (10/07 0429) Pulse Rate: 83 (10/07 0429)  Labs:  Recent Labs  04/04/16 1252 04/04/16 1834 04/05/16 0034  HGB 13.8  --   --   HCT 43.0  --   --   PLT 178  --   --   APTT  --  137*  --   LABPROT 13.6  --   --   INR 1.04  --   --   HEPARINUNFRC  --   --  0.42  CREATININE 1.03*  --   --   CKTOTAL 1,363*  --   --   TROPONINI 2.30* 2.41* 1.53*    Estimated Creatinine Clearance: 32.2 mL/min (by C-G formula based on SCr of 1.03 mg/dL (H)).   Medical History: Past Medical History:  Diagnosis Date  . Back pain   . Chronic respiratory failure (HCC)   . COPD (chronic obstructive pulmonary disease) (HCC)   . Glaucoma   . Insomnia   . Nocturnal leg cramps   . Osteoarthritis     Assessment: [age] yo female with elevated troponin of 2.3 and possible NSTEMI. Pharmacy consulted for dosing and monitoring of Heparin.   Goal of Therapy:  Heparin level 0.3-0.7 units/ml Monitor platelets by anticoagulation protocol: Yes   Plan:  Give 3000 units bolus x 1 Start heparin infusion at 700 units/hr Check anti-Xa level in 8 hours and daily while on heparin Continue to monitor H&H and platelets   10/7 00:30 heparin level 0.42. Continue current regimen. Recheck in 8 hours to confirm.  Cher NakaiSheema Hallaji, PharmD Clinical Pharmacist 04/05/2016 6:28 AM

## 2016-04-05 NOTE — Progress Notes (Signed)
Dr. Elisabeth PigeonVachhani paged to make aware of elevated HR/ pt asymptomatic/ will continue to monitor

## 2016-04-05 NOTE — Progress Notes (Signed)
ANTICOAGULATION CONSULT NOTE - Initial Consult  Pharmacy Consult for Heparin Drip Indication: chest pain/ACS- NSTEMI  Allergies  Allergen Reactions  . Shellfish Allergy   . Codeine     headache  . Prednisone     Altered Mental Status  . Tetracycline     Patient Measurements: Height: 5\' 2"  (157.5 cm) Weight: 126 lb 4.8 oz (57.3 kg) IBW/kg (Calculated) : 50.1  Vital Signs: Temp: 98.4 F (36.9 C) (10/07 0745) Temp Source: Oral (10/07 0429) BP: 165/79 (10/07 0745) Pulse Rate: 77 (10/07 0842)  Labs:  Recent Labs  04/04/16 1252 04/04/16 1834 04/05/16 0030 04/05/16 0034 04/05/16 0910  HGB 13.8  --  11.5*  --   --   HCT 43.0  --  34.2*  --   --   PLT 178  --  141*  --   --   APTT  --  137*  --   --   --   LABPROT 13.6  --   --   --   --   INR 1.04  --   --   --   --   HEPARINUNFRC  --   --   --  0.42 0.28*  CREATININE 1.03*  --  0.62  --   --   CKTOTAL 1,363*  --  822*  --   --   TROPONINI 2.30* 2.41*  --  1.53*  --     Estimated Creatinine Clearance: 41.4 mL/min (by C-G formula based on SCr of 0.62 mg/dL).   Medical History: Past Medical History:  Diagnosis Date  . Back pain   . Chronic respiratory failure (HCC)   . COPD (chronic obstructive pulmonary disease) (HCC)   . Glaucoma   . Insomnia   . Nocturnal leg cramps   . Osteoarthritis     Assessment: 82 yo female with elevated troponin of 2.3 and possible NSTEMI. Pharmacy consulted for dosing and monitoring of Heparin.   10/7 09:00 Heparin level rsulted at 0.28  Goal of Therapy:  Heparin level 0.3-0.7 units/ml Monitor platelets by anticoagulation protocol: Yes   Plan:  Will order Heparin 800 units bolus and increase drip to 800 units/hr. Recheck Heparin level in 8 hours.  Clovia CuffLisa Phala Schraeder, PharmD, BCPS 04/05/2016 11:21 AM

## 2016-04-05 NOTE — Consult Note (Signed)
CARDIOLOGY CONSULT NOTE       Patient ID: [name] [name] MRN: [id number] DOB/AGE: 80/17/1931 80 y.o.  Admit date: 04/04/2016 Referring Physician: Elisabeth Pigeon Primary Physician: Mila Merry, MD Primary Cardiologist:  None Reason for Consultation: Pre op  and elevated troponin  Active Problems:   NSTEMI (non-ST elevated myocardial infarction) [facility])   HPI:  82 y.o. admitted after fall earlier in week. Patient not clear if she passed out or not Lives alone and was on floor for a long time. Very weak Finally able to get help. In ER had elevated WBC , CPK and lactic acid Concern for cholecystitis. History of COPD No history of cardiac issues. Denies SSCP. Troponin elevated over 2 ECG with no acute chagnes GB US concerning for acalculous cholecystitis.  HIDA scan not done. ECG reviewed in paper chart and no acute ST changes   ROS All other systems reviewed and negative except as noted above  Past Medical History:  Diagnosis Date  . Back pain   . Chronic respiratory failure (HCC)   . COPD (chronic obstructive pulmonary disease) (HCC)   . Glaucoma   . Insomnia   . Nocturnal leg cramps   . Osteoarthritis     Family History  Problem Relation Age of Onset  . Cancer Sister     cervical  . Cerebral aneurysm Father     reason for death    Social History   Social History  . Marital status: Single    Spouse name: N/A  . Number of children: 2  . Years of education: N/A   Occupational History  . Not on file.   Social History Main Topics  . Smoking status: Former Smoker    Packs/day: 1.00    Years: 50.00    Types: Cigarettes    Quit date: 06/30/2000  . Smokeless tobacco: Never Used  . Alcohol use No  . Drug use: No  . Sexual activity: Not Currently   Other Topics Concern  . Not on file   Social History Narrative  . No narrative on file    Past Surgical History:  Procedure Laterality Date  . APPENDECTOMY  1947  . BREAST SURGERY  1969   biopsy  . HIP FRACTURE SURGERY  Right 2008  . VAGINAL HYSTERECTOMY  1975     . alum & mag hydroxide-simeth  15 mL Oral Once  . arformoterol  15 mcg Nebulization BID  . aspirin EC  81 mg Oral Daily  . atorvastatin  40 mg Oral q1800  . cholecalciferol  1,000 Units Oral Daily  . docusate sodium  100 mg Oral BID  . dorzolamide  1 drop Both Eyes BID  . latanoprost  1 drop Both Eyes QHS   . sodium chloride 75 mL/hr at 04/05/16 0523  . heparin 700 Units/hr (04/04/16 1845)    Physical Exam: Blood pressure (!) 165/79, pulse 77, temperature 98.4 F (36.9 C), resp. rate 18, height 5\' 2"  (1.575 m), weight 57.3 kg (126 lb 4.8 oz), SpO2 95 %.   Affect appropriate Healthy:  appears stated age HEENT: normal Neck supple with no adenopathy JVP normal no bruits no thyromegaly Lungs clear with no wheezing and good diaphragmatic motion Heart:  S1/S2 no murmur, no rub, gallop or click PMI normal Abdomen: RUQ moderate pain to palpation no rebound  no bruit.  No HSM or HJR Distal pulses intact with no bruits No edema Neuro non-focal Skin warm and dry No muscular weakness   Labs:   Lab  Results  Component Value Date   WBC 12.3 (H) 04/05/2016   HGB 11.5 (L) 04/05/2016   HCT 34.2 (L) 04/05/2016   MCV 93.1 04/05/2016   PLT 141 (L) 04/05/2016    Recent Labs Lab 04/04/16 1252  NA 141  K 4.2  CL 106  CO2 24  BUN 27*  CREATININE 1.03*  CALCIUM 8.9  PROT 7.2  BILITOT 1.0  ALKPHOS 71  ALT 26  AST 55*  GLUCOSE 160*   Lab Results  Component Value Date   CKTOTAL 1,363 (H) 04/04/2016   CKMB 1.9 03/23/2013   TROPONINI 1.53 (HH) 04/05/2016   No results found for: CHOL No results found for: HDL No results found for: LDLCALC No results found for: TRIG No results found for: CHOLHDL No results found for: LDLDIRECT    Radiology: Dg Chest 1 View  Result Date: 04/04/2016 CLINICAL DATA:  Fall.  Altered mental status. EXAM: CHEST 1 VIEW COMPARISON:  08/24/2014 FINDINGS: Mid thoracic vertebral augmentations.  Atherosclerotic aortic arch. Possible emphysema. Upper zone pulmonary vascular prominence. No pleural effusion. Bony demineralization. IMPRESSION: 1. Suspected emphysema. 2. Upper zone pulmonary vascular prominence could reflect pulmonary venous hypertension. No overt edema. 3. Atherosclerotic aortic arch. 4. Mid thoracic vertebral augmentations; bony demineralization suggesting osteoporosis. Electronically Signed   By: Gaylyn Rong M.D.   On: 04/04/2016 13:48   Dg Pelvis 1-2 Views  Result Date: 04/04/2016 CLINICAL DATA:  COPD.  Fall last night, found down. EXAM: PELVIS - 1-2 VIEW COMPARISON:  02/12/2016 FINDINGS: Bony demineralization. Right femoral nail with helical hip screw. Moderate degenerative chondral thinning in both hips. No pelvic fracture identified, strict arm obscured by extensive stool in the rectosigmoid. Vascular calcifications. IMPRESSION: 1. No acute bony findings in the pelvis. 2. Right femoral nail with helical hip screw in place. Electronically Signed   By: Gaylyn Rong M.D.   On: 04/04/2016 13:50   Ct Head Wo Contrast  Result Date: 04/04/2016 CLINICAL DATA:  Altered mental status. Concern for intracranial hemorrhage. EXAM: CT HEAD WITHOUT CONTRAST TECHNIQUE: Contiguous axial images were obtained from the base of the skull through the vertex without intravenous contrast. COMPARISON:  None. FINDINGS: Brain: No definite evidence of acute infarction, hemorrhage, hydrocephalus, extra-axial collection or mass lesion/mass effect. There is advanced microangiopathy of the periventricular white matter. Slightly asymmetric hypoattenuated appearance is seen in the right subcortical frontal lobe. This may represent asymmetric involvement with microangiopathy, however small age-indeterminate infarct cannot be entirely excluded. Vascular: No hyperdense vessel or unexpected calcification. Skull: Normal. Negative for fracture or focal lesion. Sinuses/Orbits: No acute finding. Other: None.  IMPRESSION: Advance periventricular microangiopathy. Un area of asymmetric hypoattenuation in the subcortical right frontal lobe may represent asymmetric involvement with microangiopathy versus age-indeterminate small infarct. No evidence of intracranial hemorrhage. Electronically Signed   By: Ted Mcalpine M.D.   On: 04/04/2016 13:24   US Abdomen Limited Ruq  Result Date: 04/04/2016 CLINICAL DATA:  Upper abdominal pain for 1 day EXAM: US ABDOMEN LIMITED - RIGHT UPPER QUADRANT COMPARISON:  None. FINDINGS: Gallbladder: The gallbladder is mildly distended with wall thickening and mild pericholecystic fluid. No gallstones are evident. No sonographic Murphy sign noted by sonographer. Common bile duct: Diameter: 7 mm, upper normal. No intrahepatic or extrahepatic biliary duct dilatation. Liver: No focal lesion identified. Within normal limits in parenchymal echogenicity. Right hepatic vein and inferior vena cava appear prominent. IMPRESSION: Findings concerning for a degree of acalculus cholecystitis. It may be reasonable to consider nuclear medicine hepatobiliary imaging study to assess  for cystic duct patency. Prominence of the right hepatic vein and inferior vena cava. Question a degree of right heart failure. Electronically Signed   By: Bretta BangWilliam  Woodruff III M.D.   On: 04/04/2016 14:16    EKG: NSR normal no acute ST/T wave changes    ASSESSMENT AND PLAN:  Elevated troponin:  CPK elevation likely from prolonged down time . Worrisome that troponin peaked at 2.4 and evolving down suggests that it does represent cardiac injury. Discussed with primary. Will order echo. Surgery to see. If her gallbladder deteriorates and it looks like she will need surgery would [facility] cath first. If she needs intervention would follow this with IR radiology tube placement to decompress GB.  If She remains stable with improved labs with convervative Rx can consider cath her with Dr Kirke CorinArida on Monday.   Discussed issues with patient. She is not happy about all the "fuss" Only has one grandson as family   COPD:  No active wheezing CXR ok   GB:  Tried to eat this am and nauseated would consider putting on clears or making NPO and consider HIDA scan continue antibiotics  Signed: Charlton Hawseter Nishan 04/05/2016, 9:55 AM

## 2016-04-05 NOTE — Progress Notes (Signed)
Resuming care for remainder of shift. [facility]. Patient resting quietly. Family at bedside. Will continue to monitor.

## 2016-04-05 NOTE — Progress Notes (Addendum)
[employer] [facility] [facility] [place] [facility]   PATIENT NAME: [name] [name]    MR#:  [id number]  DATE OF BIRTH:  [birth year]  SUBJECTIVE:  CHIEF COMPLAINT:   Chief Complaint  Patient presents with  . Fall    felling generalized weak and malaise for 2-3 days- found on floor after a fall at home. CK high, have abd pain- Some thickening in GB. Troponin elevated- started on heparin drip, no c/o chest pain ever.  REVIEW OF SYSTEMS:  CONSTITUTIONAL: No fever,positive for fatigue or weakness.  EYES: No blurred or double vision.  EARS, NOSE, AND THROAT: No tinnitus or ear pain.  RESPIRATORY: No cough, shortness of breath, wheezing or hemoptysis.  CARDIOVASCULAR: No chest pain, orthopnea, edema.  GASTROINTESTINAL: No nausea, vomiting, diarrhea , have some abdominal pain.  GENITOURINARY: No dysuria, hematuria.  ENDOCRINE: No polyuria, nocturia,  HEMATOLOGY: No anemia, easy bruising or bleeding SKIN: No rash or lesion. MUSCULOSKELETAL: No joint pain or arthritis.   NEUROLOGIC: No tingling, numbness, weakness.  PSYCHIATRY: No anxiety or depression.   ROS  DRUG ALLERGIES:   Allergies  Allergen Reactions  . Shellfish Allergy   . Codeine     headache  . Prednisone     Altered Mental Status  . Tetracycline     VITALS:  Blood pressure (!) 143/61, pulse 81, temperature 98.1 F (36.7 C), temperature source Oral, resp. rate 17, height 5\' 2"  (1.575 m), weight 57.3 kg (126 lb 4.8 oz), SpO2 95 %.  PHYSICAL EXAMINATION:  GENERAL:  82 y.o.-year-old patient lying in the bed with no acute distress.  EYES: Pupils equal, round, reactive to light and accommodation. No scleral icterus. Extraocular muscles intact.  HEENT: Head atraumatic, normocephalic. Oropharynx and nasopharynx clear.  NECK:  Supple, no jugular venous distention. No thyroid enlargement, no tenderness.  LUNGS: Normal breath sounds bilaterally, no wheezing, rales,rhonchi or crepitation. No use of accessory muscles of  respiration.  CARDIOVASCULAR: S1, S2 normal. No murmurs, rubs, or gallops.  ABDOMEN: Soft, RUQ and epigastric mild tender, nondistended. Bowel sounds present. No organomegaly or mass.  EXTREMITIES: No pedal edema, cyanosis, or clubbing.  NEUROLOGIC: Cranial nerves II through XII are intact. Muscle strength 4/5 in all extremities. Sensation intact. Gait not checked.  PSYCHIATRIC: The patient is alert and oriented x 3.  SKIN: No obvious rash, lesion, or ulcer.   Physical Exam LABORATORY PANEL:   CBC  Recent Labs Lab 04/05/16 0030  WBC 12.3*  HGB 11.5*  HCT 34.2*  PLT 141*   ------------------------------------------------------------------------------------------------------------------  Chemistries   Recent Labs Lab 04/04/16 1252 04/05/16 0030  NA 141 136  K 4.2 4.0  CL 106 107  CO2 24 23  GLUCOSE 160* 127*  BUN 27* 20  CREATININE 1.03* 0.62  CALCIUM 8.9 8.3*  MG 2.1  --   AST 55* 49*  ALT 26 23  ALKPHOS 71 59  BILITOT 1.0 1.0   ------------------------------------------------------------------------------------------------------------------  Cardiac Enzymes  Recent Labs Lab 04/04/16 1834 04/05/16 0034  TROPONINI 2.41* 1.53*   ------------------------------------------------------------------------------------------------------------------  RADIOLOGY:  Dg Chest 1 View  Result Date: 04/04/2016 CLINICAL DATA:  Fall.  Altered mental status. EXAM: CHEST 1 VIEW COMPARISON:  08/24/2014 FINDINGS: Mid thoracic vertebral augmentations. Atherosclerotic aortic arch. Possible emphysema. Upper zone pulmonary vascular prominence. No pleural effusion. Bony demineralization. IMPRESSION: 1. Suspected emphysema. 2. Upper zone pulmonary vascular prominence could reflect pulmonary venous hypertension. No overt edema. 3. Atherosclerotic aortic arch. 4. Mid thoracic vertebral augmentations; bony demineralization suggesting osteoporosis. Electronically Signed   By:  Gaylyn RongWalter   Liebkemann M.D.   On: 04/04/2016 13:48   Dg Pelvis 1-2 Views  Result Date: 04/04/2016 CLINICAL DATA:  COPD.  Fall last night, found down. EXAM: PELVIS - 1-2 VIEW COMPARISON:  02/12/2016 FINDINGS: Bony demineralization. Right femoral nail with helical hip screw. Moderate degenerative chondral thinning in both hips. No pelvic fracture identified, strict arm obscured by extensive stool in the rectosigmoid. Vascular calcifications. IMPRESSION: 1. No acute bony findings in the pelvis. 2. Right femoral nail with helical hip screw in place. Electronically Signed   By: Gaylyn RongWalter  Liebkemann M.D.   On: 04/04/2016 13:50   Ct Head Wo Contrast  Result Date: 04/04/2016 CLINICAL DATA:  Altered mental status. Concern for intracranial hemorrhage. EXAM: CT HEAD WITHOUT CONTRAST TECHNIQUE: Contiguous axial images were obtained from the base of the skull through the vertex without intravenous contrast. COMPARISON:  None. FINDINGS: Brain: No definite evidence of acute infarction, hemorrhage, hydrocephalus, extra-axial collection or mass lesion/mass effect. There is advanced microangiopathy of the periventricular white matter. Slightly asymmetric hypoattenuated appearance is seen in the right subcortical frontal lobe. This may represent asymmetric involvement with microangiopathy, however small age-indeterminate infarct cannot be entirely excluded. Vascular: No hyperdense vessel or unexpected calcification. Skull: Normal. Negative for fracture or focal lesion. Sinuses/Orbits: No acute finding. Other: None. IMPRESSION: Advance periventricular microangiopathy. Un area of asymmetric hypoattenuation in the subcortical right frontal lobe may represent asymmetric involvement with microangiopathy versus age-indeterminate small infarct. No evidence of intracranial hemorrhage. Electronically Signed   By: Ted Mcalpineobrinka  Dimitrova M.D.   On: 04/04/2016 13:24   [place] Abdomen Limited Ruq  Result Date: 04/04/2016 CLINICAL DATA:  Upper abdominal pain  for 1 day EXAM: US ABDOMEN LIMITED - RIGHT UPPER QUADRANT COMPARISON:  None. FINDINGS: Gallbladder: The gallbladder is mildly distended with wall thickening and mild pericholecystic fluid. No gallstones are evident. No sonographic Murphy sign noted by sonographer. Common bile duct: Diameter: 7 mm, upper normal. No intrahepatic or extrahepatic biliary duct dilatation. Liver: No focal lesion identified. Within normal limits in parenchymal echogenicity. Right hepatic vein and inferior vena cava appear prominent. IMPRESSION: Findings concerning for a degree of acalculus cholecystitis. It may be reasonable to consider nuclear medicine hepatobiliary imaging study to assess for cystic duct patency. Prominence of the right hepatic vein and inferior vena cava. Question a degree of right heart failure. Electronically Signed   By: Bretta BangWilliam  Woodruff III M.D.   On: 04/04/2016 14:16    ASSESSMENT AND PLAN:   Active Problems:   NSTEMI (non-ST elevated myocardial infarction) (HCC)  * NSTEMi   On Heparin drip, cardio planing for cath on Monday.    If need urgent surgery- [facility]  * sinus tachycardia   Likely deu to pain   IV metoprolol given, stable now.  * Acalculous cholecystitis   As per surgery , less likely to need surgery.    Suggest to stop Abx and continue diet.  * Rhabdomyolysis   CK high, IV fluids, monitor.  *  Fall   May need PT eval.  * COPD with ch oxygen use   Monitor.  All the records are reviewed and case discussed with Care Management/Social Workerr. Management plans discussed with the patient, family and they are in agreement.  CODE STATUS: Full.  TOTAL TIME TAKING CARE OF THIS PATIENT: 35 critical care minutes.  Critical due to acute MI and tachycaria, discussed with cardiologist and surgeon.   POSSIBLE D/C IN 1-2 DAYS, DEPENDING ON CLINICAL CONDITION.  Altamese Dilling M.D on 04/05/2016   Between 7am to 6pm - Pager -  [phone]  After 6pm go to [email or web address] - [email or web address]  [facility]  Office  [phone]  CC: Primary care physician; Mila Merry, MD  Note: This dictation was prepared with Dragon dictation along with smaller phrase technology. Any transcriptional errors that result from this process are unintentional.

## 2016-04-05 NOTE — Progress Notes (Addendum)
ANTICOAGULATION CONSULT NOTE - Initial Consult  Pharmacy Consult for Heparin Drip Indication: chest pain/ACS- NSTEMI  Allergies  Allergen Reactions  . Shellfish Allergy   . Codeine     headache  . Prednisone     Altered Mental Status  . Tetracycline     Patient Measurements: Height: 5\' 2"  (157.5 cm) Weight: 126 lb 4.8 oz (57.3 kg) IBW/kg (Calculated) : 50.1  Vital Signs: Temp: 98.7 F (37.1 C) (10/07 1922) Temp Source: Oral (10/07 1922) BP: 148/62 (10/07 1922) Pulse Rate: 92 (10/07 1922)  Labs:  Recent Labs  04/04/16 1252 04/04/16 1834 04/05/16 0030 04/05/16 0034 04/05/16 0910 04/05/16 1855  HGB 13.8  --  11.5*  --   --   --   HCT 43.0  --  34.2*  --   --   --   PLT 178  --  141*  --   --   --   APTT  --  137*  --   --   --   --   LABPROT 13.6  --   --   --   --   --   INR 1.04  --   --   --   --   --   HEPARINUNFRC  --   --   --  0.42 0.28* 0.43  CREATININE 1.03*  --  0.62  --   --   --   CKTOTAL 1,363*  --  822*  --   --   --   TROPONINI 2.30* 2.41*  --  1.53*  --   --     Estimated Creatinine Clearance: 41.4 mL/min (by C-G formula based on SCr of 0.62 mg/dL).   Medical History: Past Medical History:  Diagnosis Date  . Back pain   . Chronic respiratory failure (HCC)   . COPD (chronic obstructive pulmonary disease) (HCC)   . Glaucoma   . Insomnia   . Nocturnal leg cramps   . Osteoarthritis     Assessment: 82 yo female with elevated troponin of 2.3 and possible NSTEMI. Pharmacy consulted for dosing and monitoring of Heparin.   10/7 09:00 Heparin level resulted at 0.28  10/7 1900 HL 0.43  10/8 AM heparin level 0.39. Continue current regimen. Recheck CBC and heparin level with tomorrow AM labs.  Goal of Therapy:  Heparin level 0.3-0.7 units/ml Monitor platelets by anticoagulation protocol: Yes   Plan:  Heparin level now therapeutic. Will continue the drip at  Heparin 800 units/hr. Recheck Heparin level with am labs.  Cher NakaiSheema Hallaji,  PharmD Clinical Pharmacist 04/05/2016 7:47 PM

## 2016-04-05 NOTE — H&P (Signed)
[name] [name] is an 82 y.o. female.   Chief Complaint: acalculous cholecystitis  HPI: 82 yr old female who was found down in her home, unsure of how long admitted with possible NSTEMI, mild rhabdomyolysis and concern for acalculous cholecystitis.  Patient now tolerating a regular diet.  She is no currently having pain but has had some intermittent epigastric/RUQ pain.  Food does not cause pain and she has not had any fever, chills, nausea or vomiting.    Past Medical History:  Diagnosis Date  . Back pain   . Chronic respiratory failure ([place])   . COPD (chronic obstructive pulmonary disease) ([place])   . Glaucoma   . Insomnia   . Nocturnal leg cramps   . Osteoarthritis     Past Surgical History:  Procedure Laterality Date  . APPENDECTOMY  1947  . BREAST SURGERY  1969   biopsy  . HIP FRACTURE SURGERY Right 2008  . VAGINAL HYSTERECTOMY  1975    Family History  Problem Relation Age of Onset  . Cancer Sister     cervical  . Cerebral aneurysm Father     reason for death   Social History:  reports that she quit smoking about 15 years ago. Her smoking use included Cigarettes. She has a 50.00 pack-year smoking history. She has never used smokeless tobacco. She reports that she does not drink alcohol or use drugs.  Allergies:  Allergies  Allergen Reactions  . Shellfish Allergy   . Codeine     headache  . Prednisone     Altered Mental Status  . Tetracycline     Medications Prior to Admission  Medication Sig Dispense Refill  . albuterol (PROVENTIL HFA;VENTOLIN HFA) 108 (90 Base) MCG/ACT inhaler Inhale 2 puffs into the lungs every 4 (four) hours as needed for wheezing or shortness of breath. 3 Inhaler 1  . arformoterol (BROVANA) 15 MCG/2ML NEBU Take 2 mLs (15 mcg total) by nebulization 2 (two) times daily. 360 mL 3  . cholecalciferol (VITAMIN D) 1000 UNITS tablet Take 1,000 Units by mouth daily.    . dorzolamide (TRUSOPT) 2 % ophthalmic solution Place 1 drop into both eyes 2 (two)  times daily.     Marland Kitchen HYDROcodone-acetaminophen (NORCO/VICODIN) 5-325 MG tablet Take 1 tablet by mouth every 6 (six) hours as needed for moderate pain. 30 tablet 0  . latanoprost (XALATAN) 0.005 % ophthalmic solution Place 1 drop into both eyes at bedtime.    . Multiple Vitamins-Minerals (CENTRUM SILVER ADULT 50+ PO) Take 1 tablet by mouth daily.    . OXYGEN Inhale into the lungs.    . tiotropium (SPIRIVA) 18 MCG inhalation capsule Place 1 capsule (18 mcg total) into inhaler and inhale daily. (Patient not taking: Reported on 04/04/2016) 90 capsule 3    Results for orders placed or performed during the hospital encounter of 04/04/16 (from the past 48 hour(s))  Glucose, capillary     Status: Abnormal   Collection Time: 04/04/16 12:49 PM  Result Value Ref Range   Glucose-Capillary 155 (H) 65 - 99 mg/dL  Urinalysis complete, with microscopic ([facility] only)     Status: Abnormal   Collection Time: 04/04/16 12:51 PM  Result Value Ref Range   Color, Urine YELLOW (A) YELLOW   APPearance CLEAR (A) CLEAR   Glucose, UA NEGATIVE NEGATIVE mg/dL   Bilirubin Urine NEGATIVE NEGATIVE   Ketones, ur 2+ (A) NEGATIVE mg/dL   Specific Gravity, Urine 1.020 1.005 - 1.030   Hgb urine dipstick 1+ (A) NEGATIVE  pH 5.0 5.0 - 8.0   Protein, ur 30 (A) NEGATIVE mg/dL   Nitrite NEGATIVE NEGATIVE   Leukocytes, UA NEGATIVE NEGATIVE   RBC / HPF 0-5 0 - 5 RBC/hpf   WBC, UA 0-5 0 - 5 WBC/hpf   Bacteria, UA NONE SEEN NONE SEEN   Squamous Epithelial / LPF 0-5 (A) NONE SEEN   Mucous PRESENT   CBC with Differential/Platelet     Status: Abnormal   Collection Time: 04/04/16 12:52 PM  Result Value Ref Range   WBC 18.7 (H) 3.6 - 11.0 K/uL   RBC 4.54 3.80 - 5.20 MIL/uL   Hemoglobin 13.8 12.0 - 16.0 g/dL   HCT 43.0 35.0 - 47.0 %   MCV 94.9 80.0 - 100.0 fL   MCH 30.3 26.0 - 34.0 pg   MCHC 32.0 32.0 - 36.0 g/dL   RDW 14.5 11.5 - 14.5 %   Platelets 178 150 - 440 K/uL   Neutrophils Relative % 93 %   Neutro Abs 17.2 (H) 1.4 - 6.5  K/uL   Lymphocytes Relative 2 %   Lymphs Abs 0.4 (L) 1.0 - 3.6 K/uL   Monocytes Relative 5 %   Monocytes Absolute 1.0 (H) 0.2 - 0.9 K/uL   Eosinophils Relative 0 %   Eosinophils Absolute 0.0 0 - 0.7 K/uL   Basophils Relative 0 %   Basophils Absolute 0.0 0 - 0.1 K/uL  Comprehensive metabolic panel     Status: Abnormal   Collection Time: 04/04/16 12:52 PM  Result Value Ref Range   Sodium 141 135 - 145 mmol/L   Potassium 4.2 3.5 - 5.1 mmol/L   Chloride 106 101 - 111 mmol/L   CO2 24 22 - 32 mmol/L   Glucose, Bld 160 (H) 65 - 99 mg/dL   BUN 27 (H) 6 - 20 mg/dL   Creatinine, Ser 1.03 (H) 0.44 - 1.00 mg/dL   Calcium 8.9 8.9 - 10.3 mg/dL   Total Protein 7.2 6.5 - 8.1 g/dL   Albumin 4.4 3.5 - 5.0 g/dL   AST 55 (H) 15 - 41 U/L   ALT 26 14 - 54 U/L   Alkaline Phosphatase 71 38 - 126 U/L   Total Bilirubin 1.0 0.3 - 1.2 mg/dL   GFR calc non Af Amer 49 (L) >60 mL/min   GFR calc Af Amer 56 (L) >60 mL/min    Comment: (NOTE) The eGFR has been calculated using the CKD EPI equation. This calculation has not been validated in all clinical situations. eGFR's persistently <60 mL/min signify possible Chronic Kidney Disease.    Anion gap 11 5 - 15  Troponin I     Status: Abnormal   Collection Time: 04/04/16 12:52 PM  Result Value Ref Range   Troponin I 2.30 (HH) <0.03 ng/mL    Comment: CRITICAL RESULT CALLED TO, READ BACK BY AND VERIFIED WITH GRACE [facility] RN AT 1330 04/04/16 MSS.   CK     Status: Abnormal   Collection Time: 04/04/16 12:52 PM  Result Value Ref Range   Total CK 1,363 (H) 38 - 234 U/L  Lactic acid, plasma     Status: Abnormal   Collection Time: 04/04/16 12:52 PM  Result Value Ref Range   Lactic Acid, Venous 2.5 (HH) 0.5 - 1.9 mmol/L    Comment: CRITICAL RESULT CALLED TO, READ BACK BY AND VERIFIED WITH GRACE [facility] RN 1330 04/04/16 MSS.   Magnesium     Status: None   Collection Time: 04/04/16 12:52 PM  Result  Value Ref Range   Magnesium 2.1 1.7 - 2.4 mg/dL  Protime-INR      Status: None   Collection Time: 04/04/16 12:52 PM  Result Value Ref Range   Prothrombin Time 13.6 11.4 - 15.2 seconds   INR 1.04   Lactic acid, plasma     Status: None   Collection Time: 04/04/16  2:47 PM  Result Value Ref Range   Lactic Acid, Venous 1.7 0.5 - 1.9 mmol/L  Blood Culture (routine x 2)     Status: None (Preliminary result)   Collection Time: 04/04/16  2:47 PM  Result Value Ref Range   Specimen Description BLOOD LEFT ARM    Special Requests BOTTLES DRAWN AEROBIC AND ANAEROBIC  3CC    Culture NO GROWTH < 24 HOURS    Report Status PENDING   Blood Culture (routine x 2)     Status: None (Preliminary result)   Collection Time: 04/04/16  2:47 PM  Result Value Ref Range   Specimen Description BLOOD RIGHT ANTECUBITAL    Special Requests      BOTTLES DRAWN AEROBIC AND ANAEROBIC  AER 2CC ANA 4CC   Culture NO GROWTH < 24 HOURS    Report Status PENDING   Troponin I     Status: Abnormal   Collection Time: 04/04/16  6:34 PM  Result Value Ref Range   Troponin I 2.41 (HH) <0.03 ng/mL    Comment: CRITICAL VALUE NOTED. VALUE IS CONSISTENT WITH PREVIOUSLY REPORTED/CALLED VALUE.PMH  APTT     Status: Abnormal   Collection Time: 04/04/16  6:34 PM  Result Value Ref Range   aPTT 137 (H) 24 - 36 seconds    Comment:        IF BASELINE aPTT IS ELEVATED, SUGGEST PATIENT RISK ASSESSMENT BE USED TO DETERMINE APPROPRIATE ANTICOAGULANT THERAPY.   CBC     Status: Abnormal   Collection Time: 04/05/16 12:30 AM  Result Value Ref Range   WBC 12.3 (H) 3.6 - 11.0 K/uL   RBC 3.67 (L) 3.80 - 5.20 MIL/uL   Hemoglobin 11.5 (L) 12.0 - 16.0 g/dL   HCT 34.2 (L) 35.0 - 47.0 %   MCV 93.1 80.0 - 100.0 fL   MCH 31.3 26.0 - 34.0 pg   MCHC 33.6 32.0 - 36.0 g/dL   RDW 14.6 (H) 11.5 - 14.5 %   Platelets 141 (L) 150 - 440 K/uL  CK     Status: Abnormal   Collection Time: 04/05/16 12:30 AM  Result Value Ref Range   Total CK 822 (H) 38 - 234 U/L  Comprehensive metabolic panel     Status: Abnormal    Collection Time: 04/05/16 12:30 AM  Result Value Ref Range   Sodium 136 135 - 145 mmol/L   Potassium 4.0 3.5 - 5.1 mmol/L   Chloride 107 101 - 111 mmol/L   CO2 23 22 - 32 mmol/L   Glucose, Bld 127 (H) 65 - 99 mg/dL   BUN 20 6 - 20 mg/dL   Creatinine, Ser 0.62 0.44 - 1.00 mg/dL   Calcium 8.3 (L) 8.9 - 10.3 mg/dL   Total Protein 5.5 (L) 6.5 - 8.1 g/dL   Albumin 3.5 3.5 - 5.0 g/dL   AST 49 (H) 15 - 41 U/L   ALT 23 14 - 54 U/L   Alkaline Phosphatase 59 38 - 126 U/L   Total Bilirubin 1.0 0.3 - 1.2 mg/dL   GFR calc non Af Amer >60 >60 mL/min   GFR calc Af Amer >  60 >60 mL/min    Comment: (NOTE) The eGFR has been calculated using the CKD EPI equation. This calculation has not been validated in all clinical situations. eGFR's persistently <60 mL/min signify possible Chronic Kidney Disease.    Anion gap 6 5 - 15  Troponin I     Status: Abnormal   Collection Time: 04/05/16 12:34 AM  Result Value Ref Range   Troponin I 1.53 (HH) <0.03 ng/mL    Comment: CRITICAL VALUE NOTED. VALUE IS CONSISTENT WITH PREVIOUSLY REPORTED/CALLED VALUE RWW   Heparin level (unfractionated)     Status: None   Collection Time: 04/05/16 12:34 AM  Result Value Ref Range   Heparin Unfractionated 0.42 0.30 - 0.70 IU/mL    Comment:        IF HEPARIN RESULTS ARE BELOW EXPECTED VALUES, AND PATIENT DOSAGE HAS BEEN CONFIRMED, SUGGEST FOLLOW UP TESTING OF ANTITHROMBIN III LEVELS.   Heparin level (unfractionated)     Status: Abnormal   Collection Time: 04/05/16  9:10 AM  Result Value Ref Range   Heparin Unfractionated 0.28 (L) 0.30 - 0.70 IU/mL    Comment:        IF HEPARIN RESULTS ARE BELOW EXPECTED VALUES, AND PATIENT DOSAGE HAS BEEN CONFIRMED, SUGGEST FOLLOW UP TESTING OF ANTITHROMBIN III LEVELS.    Dg Chest 1 View  Result Date: 04/04/2016 CLINICAL DATA:  Fall.  Altered mental status. EXAM: CHEST 1 VIEW COMPARISON:  08/24/2014 FINDINGS: Mid thoracic vertebral augmentations. Atherosclerotic aortic arch.  Possible emphysema. Upper zone pulmonary vascular prominence. No pleural effusion. Bony demineralization. IMPRESSION: 1. Suspected emphysema. 2. Upper zone pulmonary vascular prominence could reflect pulmonary venous hypertension. No overt edema. 3. Atherosclerotic aortic arch. 4. Mid thoracic vertebral augmentations; bony demineralization suggesting osteoporosis. Electronically Signed   By: Van Clines M.D.   On: 04/04/2016 13:48   Dg Pelvis 1-2 Views  Result Date: 04/04/2016 CLINICAL DATA:  COPD.  Fall last night, found down. EXAM: PELVIS - 1-2 VIEW COMPARISON:  02/12/2016 FINDINGS: Bony demineralization. Right femoral nail with helical hip screw. Moderate degenerative chondral thinning in both hips. No pelvic fracture identified, strict arm obscured by extensive stool in the rectosigmoid. Vascular calcifications. IMPRESSION: 1. No acute bony findings in the pelvis. 2. Right femoral nail with helical hip screw in place. Electronically Signed   By: Van Clines M.D.   On: 04/04/2016 13:50   Ct Head Wo Contrast  Result Date: 04/04/2016 CLINICAL DATA:  Altered mental status. Concern for intracranial hemorrhage. EXAM: CT HEAD WITHOUT CONTRAST TECHNIQUE: Contiguous axial images were obtained from the base of the skull through the vertex without intravenous contrast. COMPARISON:  None. FINDINGS: Brain: No definite evidence of acute infarction, hemorrhage, hydrocephalus, extra-axial collection or mass lesion/mass effect. There is advanced microangiopathy of the periventricular white matter. Slightly asymmetric hypoattenuated appearance is seen in the right subcortical frontal lobe. This may represent asymmetric involvement with microangiopathy, however small age-indeterminate infarct cannot be entirely excluded. Vascular: No hyperdense vessel or unexpected calcification. Skull: Normal. Negative for fracture or focal lesion. Sinuses/Orbits: No acute finding. Other: None. IMPRESSION: Advance  periventricular microangiopathy. Un area of asymmetric hypoattenuation in the subcortical right frontal lobe may represent asymmetric involvement with microangiopathy versus age-indeterminate small infarct. No evidence of intracranial hemorrhage. Electronically Signed   By: Fidela Salisbury M.D.   On: 04/04/2016 13:24   US Abdomen Limited Ruq  Result Date: 04/04/2016 CLINICAL DATA:  Upper abdominal pain for 1 day EXAM: US ABDOMEN LIMITED - RIGHT UPPER QUADRANT COMPARISON:  None. FINDINGS:  Gallbladder: The gallbladder is mildly distended with wall thickening and mild pericholecystic fluid. No gallstones are evident. No sonographic Murphy sign noted by sonographer. Common bile duct: Diameter: 7 mm, upper normal. No intrahepatic or extrahepatic biliary duct dilatation. Liver: No focal lesion identified. Within normal limits in parenchymal echogenicity. Right hepatic vein and inferior vena cava appear prominent. IMPRESSION: Findings concerning for a degree of acalculus cholecystitis. It may be reasonable to consider nuclear medicine hepatobiliary imaging study to assess for cystic duct patency. Prominence of the right hepatic vein and inferior vena cava. Question a degree of right heart failure. Electronically Signed   By: Lowella Grip III M.D.   On: 04/04/2016 14:16    Review of Systems  Constitutional: Negative for chills and fever.  HENT: Negative for congestion.   Respiratory: Negative for shortness of breath and wheezing.   Cardiovascular: Positive for chest pain and palpitations. Negative for leg swelling.  Gastrointestinal: Positive for abdominal pain. Negative for constipation, diarrhea, nausea and vomiting.  Genitourinary: Negative for dysuria and urgency.  Musculoskeletal: Negative for back pain and joint pain.  Skin: Negative for itching and rash.  Neurological: Positive for weakness. Negative for dizziness and loss of consciousness.  Psychiatric/Behavioral: Positive for memory loss.   All other systems reviewed and are negative.   Blood pressure (!) 143/61, pulse 81, temperature 98.1 F (36.7 C), temperature source Oral, resp. rate 17, height _0  (1.575 m), weight 126 lb 4.8 oz (57.3 kg), SpO2 95 %. Physical Exam  Vitals reviewed. Constitutional: She is oriented to person, place, and time. She appears well-developed and well-nourished. No distress.  HENT:  Head: Normocephalic and atraumatic.  Right Ear: External ear normal.  Left Ear: External ear normal.  Nose: Nose normal.  Mouth/Throat: Oropharynx is clear and moist. No oropharyngeal exudate.  Eyes: Conjunctivae and EOM are normal. Pupils are equal, round, and reactive to light. No scleral icterus.  Neck: Normal range of motion. Neck supple. No tracheal deviation present.  Cardiovascular: Normal rate, regular rhythm, normal heart sounds and intact distal pulses.  Exam reveals no gallop and no friction rub.   No murmur heard. Respiratory: Effort normal and breath sounds normal. No respiratory distress. She has no wheezes. She has no rales.  GI: Soft. Bowel sounds are normal. She exhibits no distension and no mass. There is tenderness. There is no rebound and no guarding.  Mild epigastric tenderness   Musculoskeletal: Normal range of motion. She exhibits no edema, tenderness or deformity.  Neurological: She is alert and oriented to person, place, and time.  Skin: Skin is dry. No rash noted. No erythema.  Psychiatric: She has a normal mood and affect. Her behavior is normal. Judgment and thought content normal.     Assessment/Plan  82 yr old with multiple chronic medical issues including COPD with home O2 and HTN, now with concern for NSTEMI.  CK and Troponin elevated, decreasing slowly now.  WBC elevated at 18 decreasing to 12 today. I have personally reviewed the [place] images which do not show any gallstones but show very mildly thickened gallbladder wall at 54m, which is almost normal, and some evidence of  dilated right hepatic vein and inferior vena cava. I have reviewed the radiology reads as well.   The gallbladder is very mild thickened wall along with evidence for CHF or pulmonary hypertension as well as being found down and NPO for a couple days would be adequate to explain the RUQ pain.  I doubt this is true acalculous  cholecystitis but more a component of the aforementioned issues.  Additionally she is tolerating a regular diet which would be unlikely with true gallbladder pathology.  We will continue to monitor but would recommend discontinuing the antibiotics and continue to monitor.  Surgery will continue to follow.     Hubbard Robinson, MD 04/05/2016, 1:34 PM

## 2016-04-06 LAB — CK: CK TOTAL: 519 U/L — AB (ref 38–234)

## 2016-04-06 LAB — CBC
HCT: 29.3 % — ABNORMAL LOW (ref 35.0–47.0)
HEMOGLOBIN: 10 g/dL — AB (ref 12.0–16.0)
MCH: 31.5 pg (ref 26.0–34.0)
MCHC: 34.3 g/dL (ref 32.0–36.0)
MCV: 91.9 fL (ref 80.0–100.0)
Platelets: 124 10*3/uL — ABNORMAL LOW (ref 150–440)
RBC: 3.18 MIL/uL — AB (ref 3.80–5.20)
RDW: 14.1 % (ref 11.5–14.5)
WBC: 10.7 10*3/uL (ref 3.6–11.0)

## 2016-04-06 LAB — HEPARIN LEVEL (UNFRACTIONATED): Heparin Unfractionated: 0.39 IU/mL (ref 0.30–0.70)

## 2016-04-06 MED ORDER — PANTOPRAZOLE SODIUM 40 MG PO TBEC
40.0000 mg | DELAYED_RELEASE_TABLET | Freq: Every day | ORAL | Status: DC
  Administered 2016-04-06 – 2016-04-08 (×3): 40 mg via ORAL
  Filled 2016-04-06 (×3): qty 1

## 2016-04-06 MED ORDER — REGADENOSON 0.4 MG/5ML IV SOLN
0.4000 mg | Freq: Once | INTRAVENOUS | Status: AC
  Administered 2016-04-07: 0.4 mg via INTRAVENOUS
  Filled 2016-04-06: qty 5

## 2016-04-06 MED ORDER — ALUM & MAG HYDROXIDE-SIMETH 200-200-20 MG/5ML PO SUSP
30.0000 mL | ORAL | Status: DC | PRN
  Administered 2016-04-06: 30 mL via ORAL

## 2016-04-06 NOTE — Progress Notes (Signed)
Patient ID: [name] [name], female   DOB: [birth year], 82 y.o.   MRN: [id number]    Subjective:  Denies SSCP, palpitations or Dyspnea Did eat a bit better in afternoon with less nausea She indicates no surgery planned  Objective:  Vitals:   04/06/16 0443 04/06/16 0754 04/06/16 1106 04/06/16 1106  BP: (!) 141/57  (!) 164/70   Pulse: 81  84   Resp: 18   19  Temp: 98.7 F (37.1 C)  98 F (36.7 C)   TempSrc: Oral     SpO2: 96% 99% 96%   Weight: 57.8 kg (127 lb 8 oz)     Height:        Intake/Output from previous day:  Intake/Output Summary (Last 24 hours) at 04/06/16 1122 Last data filed at 04/06/16 0900  Gross per 24 hour  Intake           944.33 ml  Output             1300 ml  Net          -355.67 ml    Physical Exam: Affect appropriate Pale elderly female  HEENT: normal Neck supple with no adenopathy JVP normal no bruits no thyromegaly Lungs clear with no wheezing and good diaphragmatic motion Heart:  S1/S2 no murmur, no rub, gallop or click PMI normal Abdomen: benighn, less RUQ pain to palpation no rebound  no bruit.  No HSM or HJR Distal pulses intact with no bruits No edema Neuro non-focal Skin warm and dry abrasions on elbows and legs  No muscular weakness   Lab Results: Basic Metabolic Panel:  Recent Labs  56/21/3008/12/14 1252 04/05/16 0030  NA 141 136  K 4.2 4.0  CL 106 107  CO2 24 23  GLUCOSE 160* 127*  BUN 27* 20  CREATININE 1.03* 0.62  CALCIUM 8.9 8.3*  MG 2.1  --    Liver Function Tests:  Recent Labs  04/04/16 1252 04/05/16 0030  AST 55* 49*  ALT 26 23  ALKPHOS 71 59  BILITOT 1.0 1.0  PROT 7.2 5.5*  ALBUMIN 4.4 3.5   No results for input(s): LIPASE, AMYLASE in the last 72 hours. CBC:  Recent Labs  04/04/16 1252 04/05/16 0030 04/06/16 0512  WBC 18.7* 12.3* 10.7  NEUTROABS 17.2*  --   --   HGB 13.8 11.5* 10.0*  HCT 43.0 34.2* 29.3*  MCV 94.9 93.1 91.9  PLT 178 141* 124*   Cardiac Enzymes:  Recent Labs  04/04/16 1252  04/04/16 1834 04/05/16 0030 04/05/16 0034 04/06/16 0512  CKTOTAL 1,363*  --  822*  --  519*  TROPONINI 2.30* 2.41*  --  1.53*  --     Imaging: Dg Chest 1 View  Result Date: 04/04/2016 CLINICAL DATA:  Fall.  Altered mental status. EXAM: CHEST 1 VIEW COMPARISON:  08/24/2014 FINDINGS: Mid thoracic vertebral augmentations. Atherosclerotic aortic arch. Possible emphysema. Upper zone pulmonary vascular prominence. No pleural effusion. Bony demineralization. IMPRESSION: 1. Suspected emphysema. 2. Upper zone pulmonary vascular prominence could reflect pulmonary venous hypertension. No overt edema. 3. Atherosclerotic aortic arch. 4. Mid thoracic vertebral augmentations; bony demineralization suggesting osteoporosis. Electronically Signed   By: Gaylyn RongWalter  Liebkemann M.D.   On: 04/04/2016 13:48   Dg Pelvis 1-2 Views  Result Date: 04/04/2016 CLINICAL DATA:  COPD.  Fall last night, found down. EXAM: PELVIS - 1-2 VIEW COMPARISON:  02/12/2016 FINDINGS: Bony demineralization. Right femoral nail with helical hip screw. Moderate degenerative chondral thinning in both hips. No pelvic fracture  identified, strict arm obscured by extensive stool in the rectosigmoid. Vascular calcifications. IMPRESSION: 1. No acute bony findings in the pelvis. 2. Right femoral nail with helical hip screw in place. Electronically Signed   By: Gaylyn Rong M.D.   On: 04/04/2016 13:50   Ct Head Wo Contrast  Result Date: 04/04/2016 CLINICAL DATA:  Altered mental status. Concern for intracranial hemorrhage. EXAM: CT HEAD WITHOUT CONTRAST TECHNIQUE: Contiguous axial images were obtained from the base of the skull through the vertex without intravenous contrast. COMPARISON:  None. FINDINGS: Brain: No definite evidence of acute infarction, hemorrhage, hydrocephalus, extra-axial collection or mass lesion/mass effect. There is advanced microangiopathy of the periventricular white matter. Slightly asymmetric hypoattenuated appearance is seen  in the right subcortical frontal lobe. This may represent asymmetric involvement with microangiopathy, however small age-indeterminate infarct cannot be entirely excluded. Vascular: No hyperdense vessel or unexpected calcification. Skull: Normal. Negative for fracture or focal lesion. Sinuses/Orbits: No acute finding. Other: None. IMPRESSION: Advance periventricular microangiopathy. Un area of asymmetric hypoattenuation in the subcortical right frontal lobe may represent asymmetric involvement with microangiopathy versus age-indeterminate small infarct. No evidence of intracranial hemorrhage. Electronically Signed   By: Ted Mcalpine M.D.   On: 04/04/2016 13:24   US Abdomen Limited Ruq  Result Date: 04/04/2016 CLINICAL DATA:  Upper abdominal pain for 1 day EXAM: US ABDOMEN LIMITED - RIGHT UPPER QUADRANT COMPARISON:  None. FINDINGS: Gallbladder: The gallbladder is mildly distended with wall thickening and mild pericholecystic fluid. No gallstones are evident. No sonographic Murphy sign noted by sonographer. Common bile duct: Diameter: 7 mm, upper normal. No intrahepatic or extrahepatic biliary duct dilatation. Liver: No focal lesion identified. Within normal limits in parenchymal echogenicity. Right hepatic vein and inferior vena cava appear prominent. IMPRESSION: Findings concerning for a degree of acalculus cholecystitis. It may be reasonable to consider nuclear medicine hepatobiliary imaging study to assess for cystic duct patency. Prominence of the right hepatic vein and inferior vena cava. Question a degree of right heart failure. Electronically Signed   By: Bretta Bang III M.D.   On: 04/04/2016 14:16    Cardiac Studies:  ECG:  SR no acute ST/T wave changes    Telemetry:  NSR   Echo:  EF 60-65% no RWMA;s or valve disease   Medications:   . alum & mag hydroxide-simeth  15 mL Oral Once  . arformoterol  15 mcg Nebulization BID  . aspirin EC  81 mg Oral Daily  . atorvastatin  40 mg Oral  q1800  . cholecalciferol  1,000 Units Oral Daily  . docusate sodium  100 mg Oral BID  . dorzolamide  1 drop Both Eyes BID  . latanoprost  1 drop Both Eyes QHS  . pantoprazole  40 mg Oral Daily     . sodium chloride 50 mL/hr at 04/05/16 1542  . heparin 800 Units/hr (04/05/16 1128)    Assessment/Plan:  Elevated troponin:  Coming down max 2.4 no chest pain or history of CAD No acute ECG changes in setting of prolonged down time at home. Since no immenent surgery planned and echo normal will tentatively plan lexiscan myovue for am.  Stop heparin given worsening anemia.   GB:  Advance diet exam improved plan per primary service and surgery WBC down  Anemia:  On pantoprazole guaic stools d/c heparin   Charlton Haws 04/06/2016, 11:22 AM

## 2016-04-06 NOTE — Progress Notes (Signed)
82 yr old female with recent fall and NSTEMI with concern for acalculous cholecystitis.  Abdominal pain improved but still with some intermittent indigestion.  Otherwise patient is feel better but still weak.  She denies any fever, chills, nausea or vomiting.   Vitals:   04/06/16 1106 04/06/16 1106  BP: (!) 164/70   Pulse: 84   Resp:  19  Temp: 98 F (36.7 C)    I/O last 3 completed shifts: In: 1600.3 [P.O.:260; I.V.:1290.3; IV Piggyback:50] Out: 1750 [Urine:1750] Total I/O In: 120 [P.O.:120] Out: 250 [Urine:250]   PE:  Gen: NAD Abd: soft, epigastric tenderness, otherwise non distended and non-tender  CBC Latest Ref Rng & Units 04/06/2016 04/05/2016 04/04/2016  WBC 3.6 - 11.0 K/uL 10.7 12.3(H) 18.7(H)  Hemoglobin 12.0 - 16.0 g/dL 10.0(L) 11.5(L) 13.8  Hematocrit 35.0 - 47.0 % 29.3(L) 34.2(L) 43.0  Platelets 150 - 440 K/uL 124(L) 141(L) 178   CMP Latest Ref Rng & Units 04/05/2016 04/04/2016 03/23/2013  Glucose 65 - 99 mg/dL 161(W127(H) 960(A160(H) 540(J142(H)  BUN 6 - 20 mg/dL 20 81(X27(H) 11  Creatinine 0.44 - 1.00 mg/dL 9.140.62 7.82(N1.03(H) 5.620.75  Sodium 135 - 145 mmol/L 136 141 136  Potassium 3.5 - 5.1 mmol/L 4.0 4.2 3.8  Chloride 101 - 111 mmol/L 107 106 104  CO2 22 - 32 mmol/L 23 24 25   Calcium 8.9 - 10.3 mg/dL 8.3(L) 8.9 9.5  Total Protein 6.5 - 8.1 g/dL 1.3(Y5.5(L) 7.2 -  Total Bilirubin 0.3 - 1.2 mg/dL 1.0 1.0 -  Alkaline Phos 38 - 126 U/L 59 71 -  AST 15 - 41 U/L 49(H) 55(H) -  ALT 14 - 54 U/L 23 26 -    A/P:  82 yr old female with recent fall and NSTEMI with concern for acalculous cholecystitis. I believe with her fall and not eating for several days that this is not true acalculous cholecystitis but some mild thickening from medical issues and being NPO.  Her indigestion is bothering her today but non-tender in RUQ.  Will add Malox prn and PPI to see if can improve her discomfort.  No operative intervention needed.

## 2016-04-06 NOTE — Progress Notes (Signed)
[facility] - [place] at [facility]   PATIENT NAME: [name] [name]    MR#:  [id number]  DATE OF BIRTH:  08/10/1929  SUBJECTIVE:  CHIEF COMPLAINT:   Chief Complaint  Patient presents with  . Fall    felling generalized weak and malaise for 2-3 days- found on floor after a fall at home. CK high, have abd pain- Some thickening in GB. Troponin elevated- started on heparin drip, no c/o chest pain ever.  REVIEW OF SYSTEMS:   CONSTITUTIONAL: No fever,positive for fatigue or weakness.  EYES: No blurred or double vision.  EARS, NOSE, AND THROAT: No tinnitus or ear pain.  RESPIRATORY: No cough, shortness of breath, wheezing or hemoptysis.  CARDIOVASCULAR: No chest pain, orthopnea, edema.  GASTROINTESTINAL: No nausea, vomiting, diarrhea , have some abdominal pain.  GENITOURINARY: No dysuria, hematuria.  ENDOCRINE: No polyuria, nocturia,  HEMATOLOGY: No anemia, easy bruising or bleeding SKIN: No rash or lesion. MUSCULOSKELETAL: No joint pain or arthritis.   NEUROLOGIC: No tingling, numbness, weakness.  PSYCHIATRY: No anxiety or depression.   ROS  DRUG ALLERGIES:   Allergies  Allergen Reactions  . Shellfish Allergy   . Codeine     headache  . Prednisone     Altered Mental Status  . Tetracycline     VITALS:  Blood pressure (!) 164/70, pulse 84, temperature 98 F (36.7 C), resp. rate 19, height 5\' 2"  (1.575 m), weight 57.8 kg (127 lb 8 oz), SpO2 96 %.  PHYSICAL EXAMINATION:  GENERAL:  82 y.o.-year-old patient lying in the bed with no acute distress.  EYES: Pupils equal, round, reactive to light and accommodation. No scleral icterus. Extraocular muscles intact.  HEENT: Head atraumatic, normocephalic. Oropharynx and nasopharynx clear.  NECK:  Supple, no jugular venous distention. No thyroid enlargement, no tenderness.  LUNGS: Normal breath sounds bilaterally, no wheezing, rales,rhonchi or crepitation. No use of accessory muscles of respiration.  CARDIOVASCULAR: S1,  S2 normal. No murmurs, rubs, or gallops.  ABDOMEN: Soft, RUQ and epigastric mild tender, nondistended. Bowel sounds present. No organomegaly or mass.  EXTREMITIES: No pedal edema, cyanosis, or clubbing.  NEUROLOGIC: Cranial nerves II through XII are intact. Muscle strength 4/5 in all extremities. Sensation intact. Gait not checked.  PSYCHIATRIC: The patient is alert and oriented x 3.  SKIN: No obvious rash, lesion, or ulcer.   Physical Exam LABORATORY PANEL:   CBC  Recent Labs Lab 04/06/16 0512  WBC 10.7  HGB 10.0*  HCT 29.3*  PLT 124*   ------------------------------------------------------------------------------------------------------------------  Chemistries   Recent Labs Lab 04/04/16 1252 04/05/16 0030  NA 141 136  K 4.2 4.0  CL 106 107  CO2 24 23  GLUCOSE 160* 127*  BUN 27* 20  CREATININE 1.03* 0.62  CALCIUM 8.9 8.3*  MG 2.1  --   AST 55* 49*  ALT 26 23  ALKPHOS 71 59  BILITOT 1.0 1.0   ------------------------------------------------------------------------------------------------------------------  Cardiac Enzymes  Recent Labs Lab 04/04/16 1834 04/05/16 0034  TROPONINI 2.41* 1.53*   ------------------------------------------------------------------------------------------------------------------  RADIOLOGY:  No results found.  ASSESSMENT AND PLAN:   Active Problems:   NSTEMI (non-ST elevated myocardial infarction) (HCC)  * NSTEMi   On Heparin drip, cardio was planing for cath on Monday.    If need urgent surgery- [facility]   Now heparin stopped due to drop in Hb, plan for NM stress test tomorrow.  * sinus tachycardia   Likely deu to pain   IV metoprolol given PRN,  stable now.  * Acalculous cholecystitis   As per surgery , less likely to need surgery.    Suggest to stop Abx and continue diet.  * Rhabdomyolysis   CK high, IV fluids, monitor.  *  Fall   May need PT eval.  * COPD with ch oxygen use    Monitor.  * Anemia- unclear for now    Check stool guiac.  All the records are reviewed and case discussed with Care Management/Social Workerr. Management plans discussed with the patient, family and they are in agreement.  CODE STATUS: Full.  TOTAL TIME TAKING CARE OF THIS PATIENT: 35 minutes.  discussed with cardiologist and surgeon.    POSSIBLE D/C IN 1-2 DAYS, DEPENDING ON CLINICAL CONDITION.   Altamese Dilling M.D on 04/06/2016   Between 7am to 6pm - Pager - [phone]  After 6pm go to [email or web address] - password [email or web address]  [facility] [place] [facility]  Office  [phone]  CC: Primary care physician; Mila Merry, MD  Note: This dictation was prepared with Dragon dictation along with smaller phrase technology. Any transcriptional errors that result from this process are unintentional.

## 2016-04-07 ENCOUNTER — Inpatient Hospital Stay: Payer: Medicare Other

## 2016-04-07 ENCOUNTER — Inpatient Hospital Stay (HOSPITAL_BASED_OUTPATIENT_CLINIC_OR_DEPARTMENT_OTHER): Payer: Medicare Other

## 2016-04-07 DIAGNOSIS — R748 Abnormal levels of other serum enzymes: Secondary | ICD-10-CM

## 2016-04-07 DIAGNOSIS — W19XXXA Unspecified fall, initial encounter: Secondary | ICD-10-CM

## 2016-04-07 DIAGNOSIS — D649 Anemia, unspecified: Secondary | ICD-10-CM

## 2016-04-07 DIAGNOSIS — M6282 Rhabdomyolysis: Secondary | ICD-10-CM

## 2016-04-07 DIAGNOSIS — K819 Cholecystitis, unspecified: Secondary | ICD-10-CM

## 2016-04-07 DIAGNOSIS — R7989 Other specified abnormal findings of blood chemistry: Secondary | ICD-10-CM

## 2016-04-07 DIAGNOSIS — R778 Other specified abnormalities of plasma proteins: Secondary | ICD-10-CM

## 2016-04-07 HISTORY — DX: Rhabdomyolysis: M62.82

## 2016-04-07 LAB — NM MYOCAR MULTI W/SPECT W/WALL MOTION / EF
CHL CUP NUCLEAR SDS: 0
CHL CUP NUCLEAR SSS: 0
CHL CUP STRESS STAGE 1 GRADE: 0 %
CHL CUP STRESS STAGE 1 SPEED: 0 mph
CHL CUP STRESS STAGE 2 GRADE: 0 %
CHL CUP STRESS STAGE 2 SPEED: 0 mph
CHL CUP STRESS STAGE 3 HR: 91 {beats}/min
CHL CUP STRESS STAGE 3 SPEED: 0 mph
CHL CUP STRESS STAGE 4 GRADE: 0 %
CHL CUP STRESS STAGE 4 HR: 94 {beats}/min
CSEPPMHR: 66 %
Estimated workload: 1 METS
LV dias vol: 32 mL (ref 46–106)
LV sys vol: 14 mL
Peak HR: 91 {beats}/min
Percent HR: 105 %
Rest HR: 75 {beats}/min
SRS: 0
Stage 1 HR: 74 {beats}/min
Stage 2 HR: 74 {beats}/min
Stage 3 Grade: 0 %
Stage 4 DBP: 51 mmHg
Stage 4 SBP: 147 mmHg
Stage 4 Speed: 0 mph
TID: 1.14

## 2016-04-07 LAB — CBC
HCT: 28.2 % — ABNORMAL LOW (ref 35.0–47.0)
Hemoglobin: 9.8 g/dL — ABNORMAL LOW (ref 12.0–16.0)
MCH: 32.1 pg (ref 26.0–34.0)
MCHC: 34.7 g/dL (ref 32.0–36.0)
MCV: 92.5 fL (ref 80.0–100.0)
PLATELETS: 119 10*3/uL — AB (ref 150–440)
RBC: 3.05 MIL/uL — AB (ref 3.80–5.20)
RDW: 14.4 % (ref 11.5–14.5)
WBC: 8.1 10*3/uL (ref 3.6–11.0)

## 2016-04-07 LAB — COMPREHENSIVE METABOLIC PANEL
ALT: 17 U/L (ref 14–54)
ANION GAP: 5 (ref 5–15)
AST: 37 U/L (ref 15–41)
Albumin: 3.3 g/dL — ABNORMAL LOW (ref 3.5–5.0)
Alkaline Phosphatase: 51 U/L (ref 38–126)
BUN: 9 mg/dL (ref 6–20)
CHLORIDE: 107 mmol/L (ref 101–111)
CO2: 27 mmol/L (ref 22–32)
CREATININE: 0.39 mg/dL — AB (ref 0.44–1.00)
Calcium: 8.2 mg/dL — ABNORMAL LOW (ref 8.9–10.3)
Glucose, Bld: 119 mg/dL — ABNORMAL HIGH (ref 65–99)
POTASSIUM: 3.6 mmol/L (ref 3.5–5.1)
SODIUM: 139 mmol/L (ref 135–145)
Total Bilirubin: 0.8 mg/dL (ref 0.3–1.2)
Total Protein: 5.6 g/dL — ABNORMAL LOW (ref 6.5–8.1)

## 2016-04-07 MED ORDER — METOPROLOL SUCCINATE ER 25 MG PO TB24: 12.5000 mg | ORAL_TABLET | Freq: Every day | ORAL | 0 refills | Status: DC

## 2016-04-07 MED ORDER — TECHNETIUM TC 99M TETROFOSMIN IV KIT
29.1600 | PACK | Freq: Once | INTRAVENOUS | Status: AC | PRN
  Administered 2016-04-07: 29.16 via INTRAVENOUS

## 2016-04-07 MED ORDER — ATORVASTATIN CALCIUM 40 MG PO TABS: 40.0000 mg | ORAL_TABLET | Freq: Every day | ORAL | 0 refills | Status: DC

## 2016-04-07 MED ORDER — TECHNETIUM TC 99M TETROFOSMIN IV KIT
12.2100 | PACK | Freq: Once | INTRAVENOUS | Status: AC | PRN
  Administered 2016-04-07: 12.21 via INTRAVENOUS

## 2016-04-07 MED ORDER — ASPIRIN 81 MG PO TBEC: 81.0000 mg | DELAYED_RELEASE_TABLET | Freq: Every day | ORAL | 0 refills | Status: DC

## 2016-04-07 MED ORDER — METOPROLOL SUCCINATE ER 25 MG PO TB24
12.5000 mg | ORAL_TABLET | Freq: Every day | ORAL | Status: DC
  Administered 2016-04-07 – 2016-04-08 (×2): 12.5 mg via ORAL
  Filled 2016-04-07 (×2): qty 1

## 2016-04-07 NOTE — Discharge Summary (Signed)
[facility] -  at [facility]   PATIENT NAME: [name] [name]    MR#:  [id number]  DATE OF BIRTH:  10-03-1929  DATE OF ADMISSION:  04/04/2016 ADMITTING PHYSICIAN: Milagros LollSrikar Sudini, MD  DATE OF DISCHARGE: 04/07/16  PRIMARY CARE PHYSICIAN: Mila Merryonald Fisher, MD    ADMISSION DIAGNOSIS:  Dehydration [E86.0] Elevated troponin [R74.8] Elevated CK [R74.8] Abdominal pain [R10.9] Leukocytosis, unspecified type [D72.829]  DISCHARGE DIAGNOSIS:  Principal Problem:   Rhabdomyolysis Active Problems:   COPD (chronic obstructive pulmonary disease) (HCC)   Elevated troponin   Acalculous cholecystitis   Anemia   Fall   SECONDARY DIAGNOSIS:   Past Medical History:  Diagnosis Date  . Back pain   . Chronic respiratory failure (HCC)   . COPD (chronic obstructive pulmonary disease) (HCC)   . Glaucoma   . Insomnia   . Nocturnal leg cramps   . Osteoarthritis     HOSPITAL COURSE:  [name] Peeksther Epstein  is a 82 y.o. female admitted 04/04/2016 with chief complaint Fall . Please see H&P performed by Milagros LollSrikar Sudini, MD for further information. Patient presented after above symptoms found to have elevated troponin and CK level. Originally placed on heparin given knowledge problem, patient evaluated by cardiology and underwent stress testing.   DISCHARGE CONDITIONS:   Full  CONSULTS OBTAINED:  Treatment Team:  Iran OuchMuhammad A Arida, MD Gladis Riffleatherine L Loflin, MD  DRUG ALLERGIES:   Allergies  Allergen Reactions  . Shellfish Allergy   . Codeine     headache  . Prednisone     Altered Mental Status  . Tetracycline     DISCHARGE MEDICATIONS:   Current Discharge Medication List    CONTINUE these medications which have NOT CHANGED   Details  albuterol (PROVENTIL HFA;VENTOLIN HFA) 108 (90 Base) MCG/ACT inhaler Inhale 2 puffs into the lungs every 4 (four) hours as needed for wheezing or shortness of breath. Qty: 3 Inhaler, Refills: 1   Associated Diagnoses: Chronic obstructive pulmonary  disease, unspecified COPD type (HCC)    arformoterol (BROVANA) 15 MCG/2ML NEBU Take 2 mLs (15 mcg total) by nebulization 2 (two) times daily. Qty: 360 mL, Refills: 3   Associated Diagnoses: Chronic obstructive pulmonary disease, unspecified copd, unspecified chronic bronchitis type    cholecalciferol (VITAMIN D) 1000 UNITS tablet Take 1,000 Units by mouth daily.    dorzolamide (TRUSOPT) 2 % ophthalmic solution Place 1 drop into both eyes 2 (two) times daily.     HYDROcodone-acetaminophen (NORCO/VICODIN) 5-325 MG tablet Take 1 tablet by mouth every 6 (six) hours as needed for moderate pain. Qty: 30 tablet, Refills: 0   Associated Diagnoses: Right hip pain    latanoprost (XALATAN) 0.005 % ophthalmic solution Place 1 drop into both eyes at bedtime.    Multiple Vitamins-Minerals (CENTRUM SILVER ADULT 50+ PO) Take 1 tablet by mouth daily.    OXYGEN Inhale into the lungs.   Associated Diagnoses: Pulmonary emphysema, unspecified emphysema type (HCC)    tiotropium (SPIRIVA) 18 MCG inhalation capsule Place 1 capsule (18 mcg total) into inhaler and inhale daily. Qty: 90 capsule, Refills: 3   Associated Diagnoses: Dependence on supplemental oxygen         DISCHARGE INSTRUCTIONS:    DIET:  Cardiac diet  DISCHARGE CONDITION:  Stable  ACTIVITY:  Activity as tolerated  OXYGEN:  Home Oxygen: Yes.     Oxygen Delivery: 2 liters/min via Patient connected to nasal cannula oxygen  DISCHARGE LOCATION:  home   If you experience worsening of your admission symptoms, develop  shortness of breath, life threatening emergency, suicidal or homicidal thoughts you must seek medical attention immediately by calling 911 or calling your MD immediately  if symptoms less severe.  You Must read complete instructions/literature along with all the possible adverse reactions/side effects for all the Medicines you take and that have been prescribed to you. Take any new Medicines after you have completely  understood and accpet all the possible adverse reactions/side effects.   Please note  You were cared for by a hospitalist during your hospital stay. If you have any questions about your discharge medications or the care you received while you were in the hospital after you are discharged, you can call the unit and asked to speak with the hospitalist on call if the hospitalist that took care of you is not available. Once you are discharged, your primary care physician will handle any further medical issues. Please note that NO REFILLS for any discharge medications will be authorized once you are discharged, as it is imperative that you return to your primary care physician (or establish a relationship with a primary care physician if you do not have one) for your aftercare needs so that they can reassess your need for medications and monitor your lab values.    On the day of Discharge:   VITAL SIGNS:  Blood pressure (!) 150/72, pulse 79, temperature 98.3 F (36.8 C), temperature source Oral, resp. rate 18, height 5\' 2"  (1.575 m), weight 55.8 kg (123 lb 1.6 oz), SpO2 100 %.  I/O:   Intake/Output Summary (Last 24 hours) at 04/07/16 1258 Last data filed at 04/07/16 0755  Gross per 24 hour  Intake                0 ml  Output              501 ml  Net             -501 ml    PHYSICAL EXAMINATION:  GENERAL:  82 y.o.-year-old patient lying in the bed with no acute distress.  EYES: Pupils equal, round, reactive to light and accommodation. No scleral icterus. Extraocular muscles intact.  HEENT: Head atraumatic, normocephalic. Oropharynx and nasopharynx clear.  NECK:  Supple, no jugular venous distention. No thyroid enlargement, no tenderness.  LUNGS: Normal breath sounds bilaterally, no wheezing, rales,rhonchi or crepitation. No use of accessory muscles of respiration.  CARDIOVASCULAR: S1, S2 normal. No murmurs, rubs, or gallops.  ABDOMEN: Soft, non-tender, non-distended. Bowel sounds present. No  organomegaly or mass.  EXTREMITIES: No pedal edema, cyanosis, or clubbing.  NEUROLOGIC: Cranial nerves II through XII are intact. Muscle strength 5/5 in all extremities. Sensation intact. Gait not checked.  PSYCHIATRIC: The patient is alert and oriented x 3.  SKIN: No obvious rash, lesion, or ulcer.   DATA REVIEW:   CBC  Recent Labs Lab 04/07/16 0435  WBC 8.1  HGB 9.8*  HCT 28.2*  PLT 119*    Chemistries   Recent Labs Lab 04/04/16 1252  04/07/16 0435  NA 141  < > 139  K 4.2  < > 3.6  CL 106  < > 107  CO2 24  < > 27  GLUCOSE 160*  < > 119*  BUN 27*  < > 9  CREATININE 1.03*  < > 0.39*  CALCIUM 8.9  < > 8.2*  MG 2.1  --   --   AST 55*  < > 37  ALT 26  < > 17  ALKPHOS 71  < >  51  BILITOT 1.0  < > 0.8  < > = values in this interval not displayed.  Cardiac Enzymes  Recent Labs Lab 04/05/16 0034  TROPONINI 1.53*    Microbiology Results  Results for orders placed or performed during the hospital encounter of 04/04/16  Blood Culture (routine x 2)     Status: None (Preliminary result)   Collection Time: 04/04/16  2:47 PM  Result Value Ref Range Status   Specimen Description BLOOD LEFT ARM  Final   Special Requests BOTTLES DRAWN AEROBIC AND ANAEROBIC  3CC  Final   Culture NO GROWTH 3 DAYS  Final   Report Status PENDING  Incomplete  Blood Culture (routine x 2)     Status: None (Preliminary result)   Collection Time: 04/04/16  2:47 PM  Result Value Ref Range Status   Specimen Description BLOOD RIGHT ANTECUBITAL  Final   Special Requests   Final    BOTTLES DRAWN AEROBIC AND ANAEROBIC  AER 2CC ANA 4CC   Culture NO GROWTH 3 DAYS  Final   Report Status PENDING  Incomplete    RADIOLOGY:  No results found.   Management plans discussed with the patient, family and they are in agreement.  CODE STATUS:     Code Status Orders        Start     Ordered   04/04/16 1502  Full code  Continuous     04/04/16 1503    Code Status History    Date Active Date  Inactive Code Status Order ID Comments User Context   This patient has a current code status but no historical code status.      TOTAL TIME TAKING CARE OF THIS PATIENT: 33 minutes.    Hower,  Mardi Mainland.D on 04/07/2016 at 12:58 PM  Between 7am to 6pm - Pager - [phone]  After 6pm go to [email or web address] - Social research officer, government  [facility]  Office  [phone]  CC: Primary care physician; Mila Merry, MD

## 2016-04-07 NOTE — Progress Notes (Signed)
04/07/2016  Subjective: She reports still having some chest pain. She is due to go for a stress test today. Denies any nausea.  Vital signs: Temp:  [98 F (36.7 C)-98.8 F (37.1 C)] 98.7 F (37.1 C) (10/09 0502) Pulse Rate:  [79-84] 79 (10/09 0502) Resp:  [18-20] 18 (10/09 0502) BP: (150-164)/(56-70) 153/56 (10/09 0502) SpO2:  [96 %-98 %] 96 % (10/09 0802) Weight:  [55.8 kg (123 lb 1.6 oz)] 55.8 kg (123 lb 1.6 oz) (10/09 0502)   Intake/Output: 10/08 0701 - 10/09 0700 In: 120 [P.O.:120] Out: 750 [Urine:750] Last BM Date: 04/05/16  Physical Exam: Constitutional: No acute distress Cardiac:  Regular rhythm and rate Pulm: Lungs are clear bilaterally with no respiratory distress Abdomen: Soft, nondistended, nontender to palpation.  Labs:   Recent Labs  04/06/16 0512 04/07/16 0435  WBC 10.7 8.1  HGB 10.0* 9.8*  HCT 29.3* 28.2*  PLT 124* 119*    Recent Labs  04/05/16 0030 04/07/16 0435  NA 136 139  K 4.0 3.6  CL 107 107  CO2 23 27  GLUCOSE 127* 119*  BUN 20 9  CREATININE 0.62 0.39*  CALCIUM 8.3* 8.2*    Recent Labs  04/04/16 1252  LABPROT 13.6  INR 1.04    Imaging: No results found.  Assessment/Plan: [age] year old female with new diagnosis of NSTEMI with concerns for acalculous cholecystitis. Her abdominal pain is improving still with intermittent indigestion per the patient.  -Patient will have stress test today. -No operative intervention at this time needed. -May resume diet after stress test from a general surgery standpoint.   Howie IllJose Luis Izella Ybanez, MD [facility]

## 2016-04-07 NOTE — Progress Notes (Signed)
[facility] - [place] at [facility]   PATIENT NAME: [name] [name]    MRN#:  [id number]  DATE OF BIRTH:  May 18, 1930  SUBJECTIVE:  Hospital Day: 3 days [name] [name] is a 82 y.o. female presenting with Fall .   Overnight events: no overnight events Interval Events: stress test this morning, results pending -- complains of fatigue, right shoulder pain  REVIEW OF SYSTEMS:  CONSTITUTIONAL: No fever, positive fatigue or weakness.  EYES: No blurred or double vision.  EARS, NOSE, AND THROAT: No tinnitus or ear pain.  RESPIRATORY: No cough, shortness of breath, wheezing or hemoptysis.  CARDIOVASCULAR: No chest pain, orthopnea, edema.  GASTROINTESTINAL: No nausea, vomiting, diarrhea or abdominal pain.  GENITOURINARY: No dysuria, hematuria.  ENDOCRINE: No polyuria, nocturia,  HEMATOLOGY: No anemia, easy bruising or bleeding SKIN: No rash or lesion. MUSCULOSKELETAL: right shoulder pain, otherwise No joint pain or arthritis.   NEUROLOGIC: No tingling, numbness, weakness.  PSYCHIATRY: No anxiety or depression.   DRUG ALLERGIES:   Allergies  Allergen Reactions  . Shellfish Allergy   . Codeine     headache  . Prednisone     Altered Mental Status  . Tetracycline     VITALS:  Blood pressure (!) 150/72, pulse 79, temperature 98.3 F (36.8 C), temperature source Oral, resp. rate 18, height 5\' 2"  (1.575 m), weight 55.8 kg (123 lb 1.6 oz), SpO2 100 %.  PHYSICAL EXAMINATION:  VITAL SIGNS: Vitals:   04/07/16 0502 04/07/16 1200  BP: (!) 153/56 (!) 150/72  Pulse: 79 79  Resp: 18 18  Temp: 98.7 F (37.1 C) 98.3 F (36.8 C)   GENERAL:82 y.o.female currently in no acute distress.  HEAD: Normocephalic, atraumatic.  EYES: Pupils equal, round, reactive to light. Extraocular muscles intact. No scleral icterus.  MOUTH: Moist mucosal membrane. Dentition intact. No abscess noted.  EAR, NOSE, THROAT: Clear without exudates. No external lesions.  NECK: Supple. No  thyromegaly. No nodules. No JVD.  PULMONARY: Clear to ascultation, without wheeze rails or rhonci. No use of accessory muscles, Good respiratory effort. good air entry bilaterally CHEST: Nontender to palpation.  CARDIOVASCULAR: S1 and S2. Regular rate and rhythm. No murmurs, rubs, or gallops. No edema. Pedal pulses 2+ bilaterally.  GASTROINTESTINAL: Soft, nontender, nondistended. No masses. Positive bowel sounds. No hepatosplenomegaly.  MUSCULOSKELETAL: No swelling, clubbing, or edema. Range of motion full in all extremities.  NEUROLOGIC: Cranial nerves II through XII are intact. No gross focal neurological deficits. Sensation intact. Reflexes intact.  SKIN: No ulceration, lesions, rashes, or cyanosis. Skin warm and dry. Turgor intact.  PSYCHIATRIC: Mood, affect within normal limits. The patient is awake, alert and oriented x 3. Insight, judgment intact.      LABORATORY PANEL:   CBC  Recent Labs Lab 04/07/16 0435  WBC 8.1  HGB 9.8*  HCT 28.2*  PLT 119*   ------------------------------------------------------------------------------------------------------------------  Chemistries   Recent Labs Lab 04/04/16 1252  04/07/16 0435  NA 141  < > 139  K 4.2  < > 3.6  CL 106  < > 107  CO2 24  < > 27  GLUCOSE 160*  < > 119*  BUN 27*  < > 9  CREATININE 1.03*  < > 0.39*  CALCIUM 8.9  < > 8.2*  MG 2.1  --   --   AST 55*  < > 37  ALT 26  < > 17  ALKPHOS 71  < > 51  BILITOT 1.0  < > 0.8  < > = values  in this interval not displayed. ------------------------------------------------------------------------------------------------------------------  Cardiac Enzymes  Recent Labs Lab 04/05/16 0034  TROPONINI 1.53*   ------------------------------------------------------------------------------------------------------------------  RADIOLOGY:  No results found.  EKG:   Orders placed or performed during the hospital encounter of 04/04/16  . ED EKG  . ED EKG    ASSESSMENT  AND PLAN:   [name] [name] is a 82 y.o. female presenting with Fall . Admitted 04/04/2016 : Day #: 3 days 1. Rhabdomyolysis: enzymes improved 2. Elevated trop: off heparin, stress this morning - EKG portion within normal limits, await nm part, cardio input noted 3. Shoulder pain: check xray, apply heating pad 4. chroinc respiratory failure/copd:home meds  Dispo: PT eval   All the records are reviewed and case discussed with Care Management/Social Workerr. Management plans discussed with the patient, family and they are in agreement.  CODE STATUS: full TOTAL TIME TAKING CARE OF THIS PATIENT: 28 minutes.   POSSIBLE D/C IN 1DAYS, DEPENDING ON CLINICAL CONDITION.   Hower,  Mardi MainlandDavid K M.D on 04/07/2016 at 1:34 PM  Between 7am to 6pm - Pager - [phone]  After 6pm: House Pager: [phone]  [facility]  Office  [phone]  CC: Primary care physician; Mila Merryonald Fisher, MD

## 2016-04-07 NOTE — Progress Notes (Signed)
[name] / Admit Date: 04/04/2016 / Date of Encounter: 04/07/2016, 7:49 AM   Subjective: Chest pain only with movement. Otherwise, no complaints this morning. She is for [employer]. No surgical intervention planned on her gallbladder at this time.   Review of Systems: Review of Systems  Constitutional: Positive for malaise/fatigue. Negative for chills, diaphoresis, fever and weight loss.  HENT: Negative for congestion.   Eyes: Negative for discharge and redness.  Respiratory: Negative for cough, sputum production, shortness of breath and wheezing.   Cardiovascular: Positive for chest pain. Negative for palpitations, orthopnea, claudication, leg swelling and PND.  Gastrointestinal: Positive for abdominal pain. Negative for heartburn, nausea and vomiting.  Musculoskeletal: Positive for joint pain. Negative for falls and myalgias.  Skin: Negative for rash.  Neurological: Positive for weakness. Negative for dizziness, tingling, tremors, sensory change, speech change, focal weakness and loss of consciousness.  Endo/Heme/Allergies: Does not bruise/bleed easily.  Psychiatric/Behavioral: Negative for substance abuse. The patient is not nervous/anxious.   All other systems reviewed and are negative.   Objective: Telemetry: sinus tachycardia, 1-teens.  Physical Exam: Blood pressure (!) 153/56, pulse 79, temperature 98.7 F (37.1 C), temperature source Oral, resp. rate 18, height 5\' 2"  (1.575 m), weight 123 lb 1.6 oz (55.8 kg), SpO2 96 %. Body mass index is 22.52 kg/m. General: Well developed, well nourished, in no acute distress. Head: Normocephalic, atraumatic, HOH, sclera non-icteric, no xanthomas, nares are without discharge. Neck: Negative for carotid bruits. JVP not elevated. Lungs: Clear bilaterally to auscultation without wheezes, rales, or rhonchi. Breathing is unlabored. Heart: RRR S1 S2 without murmurs, rubs, or gallops.  Abdomen: Soft, non-tender, non-distended  with normoactive bowel sounds. No rebound/guarding. Extremities: No clubbing or cyanosis. No edema. Distal pedal pulses are 2+ and equal bilaterally. Neuro: Alert and oriented X 3. Moves all extremities spontaneously. Psych:  Responds to questions appropriately with a normal affect.   Intake/Output Summary (Last 24 hours) at 04/07/16 0749 Last data filed at 04/07/16 0505  Gross per 24 hour  Intake              120 ml  Output              750 ml  Net             -630 ml    Inpatient Medications:  . arformoterol  15 mcg Nebulization BID  . aspirin EC  81 mg Oral Daily  . atorvastatin  40 mg Oral q1800  . cholecalciferol  1,000 Units Oral Daily  . docusate sodium  100 mg Oral BID  . dorzolamide  1 drop Both Eyes BID  . latanoprost  1 drop Both Eyes QHS  . pantoprazole  40 mg Oral Daily  . regadenoson  0.4 mg Intravenous Once   Infusions:  . sodium chloride 50 mL/hr at 04/07/16 0639    Labs:  Recent Labs  04/04/16 1252 04/05/16 0030 04/07/16 0435  NA 141 136 139  K 4.2 4.0 3.6  CL 106 107 107  CO2 24 23 27   GLUCOSE 160* 127* 119*  BUN 27* 20 9  CREATININE 1.03* 0.62 0.39*  CALCIUM 8.9 8.3* 8.2*  MG 2.1  --   --     Recent Labs  04/05/16 0030 04/07/16 0435  AST 49* 37  ALT 23 17  ALKPHOS 59 51  BILITOT 1.0 0.8  PROT 5.5* 5.6*  ALBUMIN 3.5 3.3*    Recent Labs  04/04/16 1252  04/06/16 0512 04/07/16  0435  WBC 18.7*  < > 10.7 8.1  NEUTROABS 17.2*  --   --   --   HGB 13.8  < > 10.0* 9.8*  HCT 43.0  < > 29.3* 28.2*  MCV 94.9  < > 91.9 92.5  PLT 178  < > 124* 119*  < > = values in this interval not displayed.  Recent Labs  04/04/16 1252 04/04/16 1834 04/05/16 0030 04/05/16 0034 04/06/16 0512  CKTOTAL 1,363*  --  822*  --  519*  TROPONINI 2.30* 2.41*  --  1.53*  --    Invalid input(s): POCBNP No results for input(s): HGBA1C in the last 72 hours.   Weights: Filed Weights   04/05/16 0429 04/06/16 0443 04/07/16 0502  Weight: 126 lb 4.8 oz (57.3  kg) 127 lb 8 oz (57.8 kg) 123 lb 1.6 oz (55.8 kg)     Radiology/Studies:  Dg Chest 1 View  Result Date: 04/04/2016 CLINICAL DATA:  Fall.  Altered mental status. EXAM: CHEST 1 VIEW COMPARISON:  08/24/2014 FINDINGS: Mid thoracic vertebral augmentations. Atherosclerotic aortic arch. Possible emphysema. Upper zone pulmonary vascular prominence. No pleural effusion. Bony demineralization. IMPRESSION: 1. Suspected emphysema. 2. Upper zone pulmonary vascular prominence could reflect pulmonary venous hypertension. No overt edema. 3. Atherosclerotic aortic arch. 4. Mid thoracic vertebral augmentations; bony demineralization suggesting osteoporosis. Electronically Signed   By: Gaylyn Rong M.D.   On: 04/04/2016 13:48   Dg Pelvis 1-2 Views  Result Date: 04/04/2016 CLINICAL DATA:  COPD.  Fall last night, found down. EXAM: PELVIS - 1-2 VIEW COMPARISON:  02/12/2016 FINDINGS: Bony demineralization. Right femoral nail with helical hip screw. Moderate degenerative chondral thinning in both hips. No pelvic fracture identified, strict arm obscured by extensive stool in the rectosigmoid. Vascular calcifications. IMPRESSION: 1. No acute bony findings in the pelvis. 2. Right femoral nail with helical hip screw in place. Electronically Signed   By: Gaylyn Rong M.D.   On: 04/04/2016 13:50   Ct Head Wo Contrast  Result Date: 04/04/2016 CLINICAL DATA:  Altered mental status. Concern for intracranial hemorrhage. EXAM: CT HEAD WITHOUT CONTRAST TECHNIQUE: Contiguous axial images were obtained from the base of the skull through the vertex without intravenous contrast. COMPARISON:  None. FINDINGS: Brain: No definite evidence of acute infarction, hemorrhage, hydrocephalus, extra-axial collection or mass lesion/mass effect. There is advanced microangiopathy of the periventricular white matter. Slightly asymmetric hypoattenuated appearance is seen in the right subcortical frontal lobe. This may represent asymmetric  involvement with microangiopathy, however small age-indeterminate infarct cannot be entirely excluded. Vascular: No hyperdense vessel or unexpected calcification. Skull: Normal. Negative for fracture or focal lesion. Sinuses/Orbits: No acute finding. Other: None. IMPRESSION: Advance periventricular microangiopathy. Un area of asymmetric hypoattenuation in the subcortical right frontal lobe may represent asymmetric involvement with microangiopathy versus age-indeterminate small infarct. No evidence of intracranial hemorrhage. Electronically Signed   By: Ted Mcalpine M.D.   On: 04/04/2016 13:24   US Abdomen Limited Ruq  Result Date: 04/04/2016 CLINICAL DATA:  Upper abdominal pain for 1 day EXAM: US ABDOMEN LIMITED - RIGHT UPPER QUADRANT COMPARISON:  None. FINDINGS: Gallbladder: The gallbladder is mildly distended with wall thickening and mild pericholecystic fluid. No gallstones are evident. No sonographic Murphy sign noted by sonographer. Common bile duct: Diameter: 7 mm, upper normal. No intrahepatic or extrahepatic biliary duct dilatation. Liver: No focal lesion identified. Within normal limits in parenchymal echogenicity. Right hepatic vein and inferior vena cava appear prominent. IMPRESSION: Findings concerning for a degree of acalculus cholecystitis. It may  be reasonable to consider nuclear medicine hepatobiliary imaging study to assess for cystic duct patency. Prominence of the right hepatic vein and inferior vena cava. Question a degree of right heart failure. Electronically Signed   By: Bretta Bang III M.D.   On: 04/04/2016 14:16     Assessment and Plan  Principal Problem:   Rhabdomyolysis Active Problems:   Elevated troponin   Fall   COPD (chronic obstructive pulmonary disease) (HCC)   NSTEMI (non-ST elevated myocardial infarction) (HCC)   Acalculous cholecystitis   Anemia    1. Elevated troponin: -Troponin peaked at 2.4, down trending -No acute EKG changes -Prolonged down  time at home -Cannot rule out primary cardiac event given troponin elevation -No plans for surgical intervention on her gallbladder at this time -She is for Lexiscan Myoview this morning to evaluate for high-risk ischemia -Heparin gtt stopped 2/2 worsening anemia, monitor -ASA -Add Toprol XL 12.5 mg  -Lipitor  2. Possible acalculous cholecystitis: -Felt to be less likely per surgery -Medical management   3. COPD: -Stable  4. Anemia: -Stable  5. Rhabdomyolysis: -IV fluids  6. Fall: -PT  Signed, Eula Listen, PA-C [facility] Pager: [phone] 04/07/2016, 7:49 AM

## 2016-04-07 NOTE — Care Management (Signed)
Patient presented from home after falling with prolonged down time.  Elevated troponin.  For stress test today.  Chron home 02.  Physical therapy is recommending short term skilled nursing

## 2016-04-07 NOTE — Evaluation (Signed)
Physical Therapy Evaluation Patient Details Name: [name] [name] MRN: [id number] DOB: 09-Oct-1929 Today's Date: 04/07/2016   History of Present Illness  presented to ER status post fall in home environment, unable to self-recover; admitted with acute rhabdomyolysis, NSTEMI (no EKG changes, troponin trending downward with peak at 2.41) and acalculus cholecystitis (medical management).  Clinical Impression  Upon evaluation, patient alert and oriented; follows all commands and demonstrates good insight/safety awareness.  Bilat UE/LE strength and ROM grossly symmetrical; generally weak and deconditioned due to acute events/illness.  Able to complete bed mobility indep; sit/stand, basic transfers and gait (75') with RW, cga/min assist.  Very slow and effortful; limited ability to respond to external perturbation.  Poor overall activity tolerance/endurance; notably SOB with minimal activity, rating BORG 5-6/10 with short-distance gait efforts (though sats remain >92%).  Unable to tolerate activity required to facilitate safe discharge home alone at this time. Would benefit from skilled PT to address above deficits and promote optimal return to PLOF; recommend transition to STR upon discharge from acute hospitalization.     Follow Up Recommendations SNF    Equipment Recommendations  Rolling walker with 5" wheels    Recommendations for Other Services       Precautions / Restrictions Precautions Precautions: Fall Restrictions Weight Bearing Restrictions: No      Mobility  Bed Mobility Overal bed mobility: Modified Independent                Transfers Overall transfer level: Needs assistance Equipment used: Rolling walker (2 wheeled) Transfers: Sit to/from Stand Sit to Stand: Min assist         General transfer comment: multiple reps, use of bilat UEs to complete  Ambulation/Gait Ambulation/Gait assistance: Min assist Ambulation Distance (Feet): 75 Feet Assistive device:  Rolling walker (2 wheeled)       General Gait Details: reciprocal stepping with slow, guarded gait; no buckling or LOB.  Significant SOB (BORG 5-6/10) with minimal activity, but sats remain >93% on 2L supplemental O2.  Stairs            Wheelchair Mobility    Modified Rankin (Stroke Patients Only)       Balance Overall balance assessment: Needs assistance Sitting-balance support: No upper extremity supported;Feet supported Sitting balance-Leahy Scale: Good     Standing balance support: Bilateral upper extremity supported Standing balance-Leahy Scale: Fair                               Pertinent Vitals/Pain Pain Assessment: No/denies pain    Home Living Family/patient expects to be discharged to:: Private residence Living Arrangements: Alone Available Help at Discharge: Family;Available PRN/intermittently Type of Home: House Home Access: Stairs to enter Entrance Stairs-Rails: Right Entrance Stairs-Number of Steps: 8 Home Layout: One level        Prior Function Level of Independence: Independent         Comments: Indep with ADLs, household and community mobility without assist device; + driving. Denies additional recent fall history.     Hand Dominance        Extremity/Trunk Assessment   Upper Extremity Assessment: Generalized weakness           Lower Extremity Assessment: Generalized weakness (grossly 4-/5 throughout)         Communication   Communication: No difficulties  Cognition Arousal/Alertness: Awake/alert Behavior During Therapy: WFL for tasks assessed/performed Overall Cognitive Status: Within Functional Limits for tasks assessed  General Comments      Exercises     Assessment/Plan    PT Assessment Patient needs continued PT services  PT Problem List Decreased strength;Decreased activity tolerance;Decreased range of motion;Decreased balance;Decreased mobility;Decreased knowledge of  use of DME;Decreased safety awareness;Decreased knowledge of precautions;Cardiopulmonary status limiting activity          PT Treatment Interventions DME instruction;Stair training;Gait training;Functional mobility training;Therapeutic activities;Therapeutic exercise;Balance training;Patient/family education    PT Goals (Current goals can be found in the Care Plan section)  Acute Rehab PT Goals Patient Stated Goal: to return home PT Goal Formulation: With patient Time For Goal Achievement: 04/21/16 Potential to Achieve Goals: Good    Frequency Min 2X/week   Barriers to discharge Decreased caregiver support      Co-evaluation               End of Session Equipment Utilized During Treatment: Gait belt Activity Tolerance: Patient limited by fatigue Patient left: in bed;with call bell/phone within reach;with bed alarm set Nurse Communication: Mobility status         Time: [id number] PT Time Calculation (min) (ACUTE ONLY): 27 min   Charges:   PT Evaluation $PT Eval Moderate Complexity: 1 Procedure     PT G Codes:       Latesha Chesney H. Manson PasseyBrown, PT, DPT, NCS 04/07/16, 5:13 PM [phone]

## 2016-04-08 ENCOUNTER — Telehealth: Payer: Self-pay | Admitting: Cardiovascular Disease

## 2016-04-08 NOTE — Discharge Summary (Signed)
[facility] [place] [facility]   PATIENT NAME: [name] [name]    MR#:  [id number]  DATE OF BIRTH:  [birth year]  DATE OF ADMISSION:  04/04/2016 ADMITTING PHYSICIAN: Milagros Loll, MD  DATE OF DISCHARGE: 04/08/16  PRIMARY CARE PHYSICIAN: Mila Merry, MD    ADMISSION DIAGNOSIS:  Dehydration [E86.0] Elevated troponin [R74.8] Elevated CK [R74.8] Abdominal pain [R10.9] Leukocytosis, unspecified type [D72.829]  DISCHARGE DIAGNOSIS:  Principal Problem:   Rhabdomyolysis Active Problems:   COPD (chronic obstructive pulmonary disease) (HCC)   Elevated troponin   Acalculous cholecystitis   Anemia   Fall   SECONDARY DIAGNOSIS:   Past Medical History:  Diagnosis Date  . Back pain   . Chronic respiratory failure (HCC)   . COPD (chronic obstructive pulmonary disease) (HCC)   . Glaucoma   . Insomnia   . Nocturnal leg cramps   . Osteoarthritis     HOSPITAL COURSE:  [name] [name]  is a 82 y.o. female admitted 04/04/2016 with chief complaint Fall . Please see H&P performed by Milagros Loll, MD for further information. Patient presented after above symptoms found to have elevated troponin and CK level. Originally placed on heparin given knowledge problem, patient evaluated by cardiology and underwent stress testing.   DISCHARGE CONDITIONS:   Full  CONSULTS OBTAINED:  Treatment Team:  Iran Ouch, MD Gladis Riffle, MD  DRUG ALLERGIES:   Allergies  Allergen Reactions  . Shellfish Allergy   . Codeine     headache  . Prednisone     Altered Mental Status  . Tetracycline     DISCHARGE MEDICATIONS:   Current Discharge Medication List    START taking these medications   Details  aspirin EC 81 MG EC tablet Take 1 tablet (81 mg total) by mouth daily. Qty: 30 tablet, Refills: 0    atorvastatin (LIPITOR) 40 MG tablet Take 1 tablet (40 mg total) by mouth daily at 6 PM. Qty: 30 tablet, Refills: 0    metoprolol succinate (TOPROL-XL) 25 MG 24  hr tablet Take 0.5 tablets (12.5 mg total) by mouth daily. Qty: 15 tablet, Refills: 0      CONTINUE these medications which have NOT CHANGED   Details  albuterol (PROVENTIL HFA;VENTOLIN HFA) 108 (90 Base) MCG/ACT inhaler Inhale 2 puffs into the lungs every 4 (four) hours as needed for wheezing or shortness of breath. Qty: 3 Inhaler, Refills: 1   Associated Diagnoses: Chronic obstructive pulmonary disease, unspecified COPD type (HCC)    arformoterol (BROVANA) 15 MCG/2ML NEBU Take 2 mLs (15 mcg total) by nebulization 2 (two) times daily. Qty: 360 mL, Refills: 3   Associated Diagnoses: Chronic obstructive pulmonary disease, unspecified copd, unspecified chronic bronchitis type    cholecalciferol (VITAMIN D) 1000 UNITS tablet Take 1,000 Units by mouth daily.    dorzolamide (TRUSOPT) 2 % ophthalmic solution Place 1 drop into both eyes 2 (two) times daily.     HYDROcodone-acetaminophen (NORCO/VICODIN) 5-325 MG tablet Take 1 tablet by mouth every 6 (six) hours as needed for moderate pain. Qty: 30 tablet, Refills: 0   Associated Diagnoses: Right hip pain    latanoprost (XALATAN) 0.005 % ophthalmic solution Place 1 drop into both eyes at bedtime.    Multiple Vitamins-Minerals (CENTRUM SILVER ADULT 50+ PO) Take 1 tablet by mouth daily.    OXYGEN Inhale into the lungs.   Associated Diagnoses: Pulmonary emphysema, unspecified emphysema type (HCC)    tiotropium (SPIRIVA) 18 MCG inhalation capsule Place 1 capsule (18 mcg total)  into inhaler and inhale daily. Qty: 90 capsule, Refills: 3   Associated Diagnoses: Dependence on supplemental oxygen         DISCHARGE INSTRUCTIONS:    DIET:  Cardiac diet  DISCHARGE CONDITION:  Stable  ACTIVITY:  Activity as tolerated  OXYGEN:  Home Oxygen: Yes.     Oxygen Delivery: 2 liters/min via Patient connected to nasal cannula oxygen  DISCHARGE LOCATION:  home   If you experience worsening of your admission symptoms, develop shortness of  breath, life threatening emergency, suicidal or homicidal thoughts you must seek medical attention immediately by calling 911 or calling your MD immediately  if symptoms less severe.  You Must read complete instructions/literature along with all the possible adverse reactions/side effects for all the Medicines you take and that have been prescribed to you. Take any new Medicines after you have completely understood and accpet all the possible adverse reactions/side effects.   Please note  You were cared for by a hospitalist during your hospital stay. If you have any questions about your discharge medications or the care you received while you were in the hospital after you are discharged, you can call the unit and asked to speak with the hospitalist on call if the hospitalist that took care of you is not available. Once you are discharged, your primary care physician will handle any further medical issues. Please note that NO REFILLS for any discharge medications will be authorized once you are discharged, as it is imperative that you return to your primary care physician (or establish a relationship with a primary care physician if you do not have one) for your aftercare needs so that they can reassess your need for medications and monitor your lab values.    On the day of Discharge:   VITAL SIGNS:  Blood pressure (!) 154/63, pulse 86, temperature 98.3 F (36.8 C), temperature source Oral, resp. rate 20, height 5\' 2"  (1.575 m), weight 57.3 kg (126 lb 4.8 oz), SpO2 97 %.  I/O:    Intake/Output Summary (Last 24 hours) at 04/08/16 0841 Last data filed at 04/08/16 0234  Gross per 24 hour  Intake             2380 ml  Output             1575 ml  Net              805 ml    PHYSICAL EXAMINATION:  GENERAL:  82 y.o.-year-old patient lying in the bed with no acute distress.  EYES: Pupils equal, round, reactive to light and accommodation. No scleral icterus. Extraocular muscles intact.  HEENT:  Head atraumatic, normocephalic. Oropharynx and nasopharynx clear.  NECK:  Supple, no jugular venous distention. No thyroid enlargement, no tenderness.  LUNGS: Normal breath sounds bilaterally, no wheezing, rales,rhonchi or crepitation. No use of accessory muscles of respiration.  CARDIOVASCULAR: S1, S2 normal. No murmurs, rubs, or gallops.  ABDOMEN: Soft, non-tender, non-distended. Bowel sounds present. No organomegaly or mass.  EXTREMITIES: No pedal edema, cyanosis, or clubbing.  NEUROLOGIC: Cranial nerves II through XII are intact. Muscle strength 5/5 in all extremities. Sensation intact. Gait not checked.  PSYCHIATRIC: The patient is alert and oriented x 3.  SKIN: No obvious rash, lesion, or ulcer.   DATA REVIEW:   CBC  Recent Labs Lab 04/07/16 0435  WBC 8.1  HGB 9.8*  HCT 28.2*  PLT 119*    Chemistries   Recent Labs Lab 04/04/16 1252  04/07/16 0435  NA  141  < > 139  K 4.2  < > 3.6  CL 106  < > 107  CO2 24  < > 27  GLUCOSE 160*  < > 119*  BUN 27*  < > 9  CREATININE 1.03*  < > 0.39*  CALCIUM 8.9  < > 8.2*  MG 2.1  --   --   AST 55*  < > 37  ALT 26  < > 17  ALKPHOS 71  < > 51  BILITOT 1.0  < > 0.8  < > = values in this interval not displayed.  Cardiac Enzymes  Recent Labs Lab 04/05/16 0034  TROPONINI 1.53*    Microbiology Results  Results for orders placed or performed during the hospital encounter of 04/04/16  Blood Culture (routine x 2)     Status: None (Preliminary result)   Collection Time: 04/04/16  2:47 PM  Result Value Ref Range Status   Specimen Description BLOOD LEFT ARM  Final   Special Requests BOTTLES DRAWN AEROBIC AND ANAEROBIC  3CC  Final   Culture NO GROWTH 4 DAYS  Final   Report Status PENDING  Incomplete  Blood Culture (routine x 2)     Status: None (Preliminary result)   Collection Time: 04/04/16  2:47 PM  Result Value Ref Range Status   Specimen Description BLOOD RIGHT ANTECUBITAL  Final   Special Requests   Final    BOTTLES DRAWN  AEROBIC AND ANAEROBIC  AER 2CC ANA 4CC   Culture NO GROWTH 4 DAYS  Final   Report Status PENDING  Incomplete    RADIOLOGY:  Dg Shoulder Right  Result Date: 04/07/2016 CLINICAL DATA:  Status post fall 5 days ago with persistent right shoulder pain EXAM: RIGHT SHOULDER - 2+ VIEW COMPARISON:  Chest x-ray dated April 04, 2016 which included a small portion of the right shoulder. FINDINGS: The bones are subjectively osteopenic. No acute fracture is observed. The glenohumeral joint space and articular surfaces appear normal for age. There is a small spur arising from the inferior articular margin of the distal clavicle at the [facility]. The subacromial subdeltoid space elsewhere appears normal. The observed portions of the right clavicle and upper right ribs exhibit no acute abnormalities. IMPRESSION: Small subacromial spur.  No acute fracture nor dislocation. Electronically Signed   By: David  SwazilandJordan M.D.   On: 04/07/2016 15:27   Nm Myocar Multi W/spect W/wall Motion / Ef  Result Date: 04/07/2016  There was no ST segment deviation noted during stress.  No T wave inversion was noted during stress.  The study is normal.  This is a low risk study.  The left ventricular ejection fraction is normal (55-65%).      Management plans discussed with the patient, family and they are in agreement.  CODE STATUS:     Code Status Orders        Start     Ordered   04/04/16 1502  Full code  Continuous     04/04/16 1503    Code Status History    Date Active Date Inactive Code Status Order ID Comments User Context   This patient has a current code status but no historical code status.      TOTAL TIME TAKING CARE OF THIS PATIENT: 33 minutes.    Hower,  Mardi MainlandDavid K M.D on 04/08/2016 at 8:41 AM  Between 7am to 6pm - Pager - [phone]  After 6pm go to [email or web address] - Therapist, nutritionalpassword EPAS [facility]  [facility] [place]  [facility]  Office  [phone]  CC: Primary care physician; Mila Merry,  MD

## 2016-04-08 NOTE — Telephone Encounter (Signed)
Pt still currently admitted.

## 2016-04-08 NOTE — Progress Notes (Signed)
Patient given discharge teaching and paperwork regarding medications, diet, follow-up appointments and activity. Patient understanding verbalized. No complaints at this time. IV and telemetry discontinued prior to leaving. Skin assessment as previously charted and vitals are stable; on chronic O2 - grandson brought portable tank. Patient being discharged to home with home health. Grandson present during discharge teaching. No further needs by Care Management. Prescriptions printed and given to patient. Escorted via wheelchair to ride home with grandson.

## 2016-04-08 NOTE — Clinical Social Work Note (Signed)
MSW received referral for SNF.  Case discussed with case manager and plan is to discharge home with home health.  MSW to sign off please re-consult if social work needs arise.  Rejoice Heatwole R. Makaria Poarch, MSW Mon-Fri 8a-4:30p [phone]   

## 2016-04-08 NOTE — Telephone Encounter (Signed)
Left message on machine for patient to contact the office.   

## 2016-04-08 NOTE — Care Management (Signed)
Patient is very firmly declining skilled nursing placement.  She says she has neighbors across the street that can check on her.  She is unable to tell CM how long she was on the floor at home.  it was reported by a care team member that patient may have been down for three days and not a matter of hours. Her grandson will transport her home.  Her portable 02 tank from [facility] is present for the transport.  [facility] assure [facility] within 24 hours.  Provided Med Alert brochure.  Unable to persuade patient to accept skilled nursing.

## 2016-04-08 NOTE — Progress Notes (Signed)
Discussed plan of care with Dr. Clint GuyHower. Patient declining SNF, will be discharged home with home health, already has home O2.

## 2016-04-08 NOTE — Telephone Encounter (Signed)
TCM..the patient being discharged today She saw Dr Kirke CorinArida in hospital Pt is coming 04/18/16 to see Alycia Rossettiyan

## 2016-04-09 LAB — CULTURE, BLOOD (ROUTINE X 2)
CULTURE: NO GROWTH
CULTURE: NO GROWTH

## 2016-04-09 NOTE — Telephone Encounter (Signed)
Patient contacted regarding discharge [facility] on 10/10.  Patient understands to follow up with provider Ryan on 10/20 at 2:30pm at [facility]. Patient understands discharge instructions? yes Patient understands medications and regiment? yes Patient understands to bring all medications to this visit? Yes  States the [facility] nurse reviewed all the instructions and "is taking care of it".  Pt states she does not want to come to her f/u appt "I see no reason to see these guys. Dr. Clint GuyHower said he got the official word from all these tests that there was nothing to worry about. Everything is fine".  She requests I cancel appt. Advised pt I will cancel and if she needs us for a heart issue in the future, please call. She verbalized understanding with no further questions.

## 2016-04-11 ENCOUNTER — Telehealth: Payer: Self-pay

## 2016-04-11 NOTE — Telephone Encounter (Signed)
Order was one week one and 2 x a week for 3 weeks.

## 2016-04-11 NOTE — Telephone Encounter (Signed)
That's fine

## 2016-04-11 NOTE — Telephone Encounter (Signed)
Clayton LefortLemuel a physical therapist is requesting a verbal order for the patient to continue her physical therapy for 2 weeks that contains weight bearing exercises. Please advise. Thanks!

## 2016-04-12 ENCOUNTER — Telehealth: Payer: Self-pay | Admitting: Family Medicine

## 2016-04-12 NOTE — Telephone Encounter (Signed)
I received an after-hours call from [facility] nurse Patient is having abdominal pain I reviewed her recent hospital notes Hgb dropped from 13.8 to 9.8 over a few days US showed acalculous cholecystitis I instructed nurse that I think she needs to return to the hospital for evaluation She agrees

## 2016-04-13 ENCOUNTER — Emergency Department
Admission: EM | Admit: 2016-04-13 | Discharge: 2016-04-13 | Disposition: A | Discharge status: Home / Self Care | Payer: Medicare Other | Attending: Emergency Medicine | Admitting: Emergency Medicine

## 2016-04-13 ENCOUNTER — Emergency Department: Payer: Medicare Other

## 2016-04-13 ENCOUNTER — Encounter: Payer: Self-pay | Admitting: Emergency Medicine

## 2016-04-13 DIAGNOSIS — R0602 Shortness of breath: Secondary | ICD-10-CM | POA: Diagnosis not present

## 2016-04-13 DIAGNOSIS — J449 Chronic obstructive pulmonary disease, unspecified: Secondary | ICD-10-CM | POA: Insufficient documentation

## 2016-04-13 DIAGNOSIS — R1084 Generalized abdominal pain: Secondary | ICD-10-CM

## 2016-04-13 DIAGNOSIS — Z87891 Personal history of nicotine dependence: Secondary | ICD-10-CM | POA: Diagnosis not present

## 2016-04-13 DIAGNOSIS — Z7982 Long term (current) use of aspirin: Secondary | ICD-10-CM | POA: Diagnosis not present

## 2016-04-13 DIAGNOSIS — Z791 Long term (current) use of non-steroidal anti-inflammatories (NSAID): Secondary | ICD-10-CM | POA: Diagnosis not present

## 2016-04-13 DIAGNOSIS — K5900 Constipation, unspecified: Secondary | ICD-10-CM | POA: Insufficient documentation

## 2016-04-13 DIAGNOSIS — Z79899 Other long term (current) drug therapy: Secondary | ICD-10-CM | POA: Diagnosis not present

## 2016-04-13 LAB — COMPREHENSIVE METABOLIC PANEL
ALK PHOS: 57 U/L (ref 38–126)
ALT: 19 U/L (ref 14–54)
AST: 28 U/L (ref 15–41)
Albumin: 3.9 g/dL (ref 3.5–5.0)
Anion gap: 11 (ref 5–15)
BUN: 11 mg/dL (ref 6–20)
CALCIUM: 8.9 mg/dL (ref 8.9–10.3)
CHLORIDE: 105 mmol/L (ref 101–111)
CO2: 27 mmol/L (ref 22–32)
CREATININE: 0.57 mg/dL (ref 0.44–1.00)
GFR calc Af Amer: >60 mL/min (ref >60)
Glucose, Bld: 141 mg/dL — ABNORMAL HIGH (ref 65–99)
Potassium: 3.5 mmol/L (ref 3.5–5.1)
SODIUM: 143 mmol/L (ref 135–145)
Total Bilirubin: 1.6 mg/dL — ABNORMAL HIGH (ref 0.3–1.2)
Total Protein: 6.4 g/dL — ABNORMAL LOW (ref 6.5–8.1)

## 2016-04-13 LAB — CBC WITH DIFFERENTIAL/PLATELET
BASOS ABS: 0.1 10*3/uL (ref 0–0.1)
Basophils Relative: 1 %
EOS PCT: 3 %
Eosinophils Absolute: 0.3 10*3/uL (ref 0–0.7)
HCT: 33.2 % — ABNORMAL LOW (ref 35.0–47.0)
HEMOGLOBIN: 11 g/dL — AB (ref 12.0–16.0)
LYMPHS ABS: 1.9 10*3/uL (ref 1.0–3.6)
LYMPHS PCT: 16 %
MCH: 31.5 pg (ref 26.0–34.0)
MCHC: 33.1 g/dL (ref 32.0–36.0)
MCV: 95.2 fL (ref 80.0–100.0)
Monocytes Absolute: 1.7 10*3/uL — ABNORMAL HIGH (ref 0.2–0.9)
Monocytes Relative: 15 %
NEUTROS PCT: 65 %
Neutro Abs: 7.6 10*3/uL — ABNORMAL HIGH (ref 1.4–6.5)
PLATELETS: 338 10*3/uL (ref 150–440)
RBC: 3.49 MIL/uL — AB (ref 3.80–5.20)
RDW: 15 % — ABNORMAL HIGH (ref 11.5–14.5)
WBC: 11.6 10*3/uL — ABNORMAL HIGH (ref 3.6–11.0)

## 2016-04-13 LAB — TROPONIN I: Troponin I: <0.03 ng/mL (ref <0.03)

## 2016-04-13 LAB — LIPASE, BLOOD: Lipase: 19 U/L (ref 11–51)

## 2016-04-13 MED ORDER — POLYETHYLENE GLYCOL 3350 17 GM/SCOOP PO POWD: ORAL | 0 refills | Status: DC

## 2016-04-13 NOTE — ED Notes (Signed)

## 2016-04-13 NOTE — ED Provider Notes (Addendum)
Patient has some results with the first enema. When told she would get another enema if she didn't have any more. She began having more stool in the toilet and is now having good results. I will discharge her she feels much better. On exam abdomen is still a little bit tender but again patient says is much better. Patient has been informed she needs to return for worse pain fever vomiting or feeling sicker. She can repeat those instructions back to me.   Arnaldo NatalPaul F Malinda, MD 04/13/16 [id number]    Arnaldo NatalPaul F Malinda, MD 04/13/16 [id number]    Arnaldo NatalPaul F Malinda, MD 04/13/16 [phone]

## 2016-04-13 NOTE — ED Triage Notes (Signed)
Patient presents to Emergency Department via EMS with complaints of abdominal pain, last BM approx 1 week ago. Pt admitted last week s/p fall and still has significant bruising to left hip, swelling to left ankle, abrasion to right ankle.

## 2016-04-13 NOTE — ED Provider Notes (Signed)
[facility] Emergency Department Provider Note   ____________________________________________   First MD Initiated Contact with Patient 04/13/16 0533     (approximate)  I have reviewed the triage vital signs and the nursing notes.   HISTORY  Chief Complaint Abdominal Pain and Shortness of Breath    HPI ALYNA STENSLAND is a 82 y.o. female with history of COPD with chronic home oxygen requirement presents for evaluation of diffuse abdominal pain and constipation, gradual onset, severe, ongoing for over a week, no modifying factors. The patient was discharged from [facility] on 04/07/2016 after either having a syncopal episode or falling in her home and being down for a few days. During that hospitalization, she was hydrated for acute rhabdomyolysis, her troponin was elevated however she underwent nuclear stress test which showed no wall motion abnormality and no perfusion defects and a troponin elevation was thought to be secondary to demand ischemia, she also had some bladder wall thickening on ultrasound however surgery evaluated her and thought that she did not have acalculus cholecystitis. She reports that she has not had a bowel movement since she was admitted to the hospital. She denies any chest pain. She feels "a little" short of breath but reports "I have COPD so I have that all the time". She denies any increased shortness of breath from baseline and this is not her concern. No fevers or chills and no recurrent falls.   Past Medical History:  Diagnosis Date  . Back pain   . Chronic respiratory failure (HCC)   . COPD (chronic obstructive pulmonary disease) (HCC)   . Glaucoma   . Insomnia   . Nocturnal leg cramps   . Osteoarthritis     Patient Active Problem List   Diagnosis Date Noted  . Rhabdomyolysis 04/07/2016  . Elevated troponin 04/07/2016  . Acalculous cholecystitis 04/07/2016  . Anemia 04/07/2016  . Fall 04/07/2016  .  NSTEMI (non-ST elevated myocardial infarction) (HCC) 04/04/2016  . Glaucoma 11/29/2014  . Insomnia 11/29/2014  . Arthritis, degenerative 11/29/2014  . Dependence on supplemental oxygen 11/29/2014  . 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 COPD (chronic obstructive pulmonary disease) (HCC) 11/29/2013  . Chronic hypoxemic respiratory failure (HCC) 11/29/2013    Past Surgical History:  Procedure Laterality Date  . APPENDECTOMY  1947  . BREAST SURGERY  1969   biopsy  . HIP FRACTURE SURGERY Right 2008  . VAGINAL HYSTERECTOMY  1975    Prior to Admission medications   Medication Sig Start Date End Date Taking? Authorizing Provider  albuterol (PROVENTIL HFA;VENTOLIN HFA) 108 (90 Base) MCG/ACT inhaler Inhale 2 puffs into the lungs every 4 (four) hours as needed for wheezing or shortness of breath. 12/25/15   Lorie Phenix, MD  arformoterol (BROVANA) 15 MCG/2ML NEBU Take 2 mLs (15 mcg total) by nebulization 2 (two) times daily. 12/25/15   Lorie Phenix, MD  aspirin EC 81 MG EC tablet Take 1 tablet (81 mg total) by mouth daily. 04/08/16   Wyatt Haste, MD  atorvastatin (LIPITOR) 40 MG tablet Take 1 tablet (40 mg total) by mouth daily at 6 PM. 04/07/16 05/07/16  Wyatt Haste, MD  cholecalciferol (VITAMIN D) 1000 UNITS tablet Take 1,000 Units by mouth daily.    Historical Provider, MD  dorzolamide (TRUSOPT) 2 % ophthalmic solution Place 1 drop into both eyes 2 (two) times daily.  10/31/14   Historical Provider, MD  HYDROcodone-acetaminophen (NORCO/VICODIN) 5-325 MG tablet Take 1 tablet by mouth every 6 (six) hours as  needed for moderate pain. 02/12/16   Malva Limes, MD  latanoprost (XALATAN) 0.005 % ophthalmic solution Place 1 drop into both eyes at bedtime.    Historical Provider, MD  metoprolol succinate (TOPROL-XL) 25 MG 24 hr tablet Take 0.5 tablets (12.5 mg total) by mouth daily. 04/08/16 05/08/16  Wyatt Haste, MD  Multiple Vitamins-Minerals (CENTRUM SILVER ADULT 50+ PO) Take 1 tablet by mouth daily.     Historical Provider, MD  OXYGEN Inhale into the lungs.    Historical Provider, MD  polyethylene glycol powder (GLYCOLAX/MIRALAX) powder Dissolve 1 tablespoon in 4-8 ounces of juice or water and drink once daily. 04/13/16   Gayla Doss, MD  tiotropium (SPIRIVA) 18 MCG inhalation capsule Place 1 capsule (18 mcg total) into inhaler and inhale daily. Patient not taking: Reported on 04/04/2016 04/26/15   Lorie Phenix, MD    Allergies Shellfish allergy; Codeine; Prednisone; and Tetracycline  Family History  Problem Relation Age of Onset  . Cancer Sister     cervical  . Cerebral aneurysm Father     reason for death    Social History Social History  Substance Use Topics  . Smoking status: Former Smoker    Packs/day: 1.00    Years: 50.00    Types: Cigarettes    Quit date: 06/30/2000  . Smokeless tobacco: Never Used  . Alcohol use No    Review of Systems Constitutional: No fever/chills Eyes: No visual changes. ENT: No sore throat. Cardiovascular: Denies chest pain. Respiratory: +chronicshortness of breath. Gastrointestinal: + abdominal pain.  No nausea, no vomiting.  No diarrhea.  + constipation. Genitourinary: Negative for dysuria. Musculoskeletal: Negative for back pain. Skin: Negative for rash. Neurological: Negative for headaches, focal weakness or numbness.  10-point ROS otherwise negative.  ____________________________________________   PHYSICAL EXAM:  Vitals:   04/13/16 0536 04/13/16 0546  BP:  121/71  Pulse:  91  Resp:  (!) 21  Temp:  98.1 F (36.7 C)  TempSrc:  Oral  SpO2:  96%  Weight: 126 lb (57.2 kg)   Height: 5\' 2"  (1.575 m)     VITAL SIGNS: ED Triage Vitals  Enc Vitals Group     BP      Pulse      Resp      Temp      Temp src      SpO2      Weight      Height      Head Circumference      Peak Flow      Pain Score      Pain Loc      Pain Edu?      Excl. in GC?     Constitutional: Alert and oriented. Nontoxic-appearing and in no acute  distress. Eyes: Conjunctivae are normal. PERRL. EOMI. Head: Atraumatic. Nose: No congestion/rhinnorhea. Mouth/Throat: Mucous membranes are moist.  Oropharynx non-erythematous. Neck: No stridor.  Supple without meningismus. Cardiovascular: Normal rate, regular rhythm. Grossly normal heart sounds.  Good peripheral circulation. Respiratory: Normal respiratory effort.  No retractions. Lungs CTAB. Gastrointestinal: Soft with mild diffuse tenderness, no rebound or guarding. Mild distention. No CVA tenderness. Genitourinary: Deferred Musculoskeletal: No lower extremity tenderness nor edema.  No joint effusions. Areas of Healing ecchymosis in the lower back as well as the buttocks bilaterally related to recent fall Neurologic:  Normal speech and language. No gross focal neurologic deficits are appreciated. No gait instability. Skin:  Skin is warm, dry and intact. No rash noted. Psychiatric: Mood and  affect are normal. Speech and behavior are normal.  ____________________________________________   LABS (all labs ordered are listed, but only abnormal results are displayed)  Labs Reviewed  CBC WITH DIFFERENTIAL/PLATELET - Abnormal; Notable for the following:       Result Value   WBC 11.6 (*)    RBC 3.49 (*)    Hemoglobin 11.0 (*)    HCT 33.2 (*)    RDW 15.0 (*)    Neutro Abs 7.6 (*)    Monocytes Absolute 1.7 (*)    All other components within normal limits  COMPREHENSIVE METABOLIC PANEL - Abnormal; Notable for the following:    Glucose, Bld 141 (*)    Total Protein 6.4 (*)    Total Bilirubin 1.6 (*)    All other components within normal limits  LIPASE, BLOOD  TROPONIN I   ____________________________________________  EKG  ED ECG REPORT I, Gayla DossGayle, Cayce Quezada A, the attending physician, personally viewed and interpreted this ECG.   Date: 04/13/2016  EKG Time: 05:45  Rate: 90  Rhythm: normal sinus rhythm with premature atrial contractions.  Axis: normal  Intervals:none  ST&T Change:  No acute ST elevation or acute ST depression.  ____________________________________________  RADIOLOGY  3 view abdominal plain films IMPRESSION:  1. Scattered air-fluid levels within central small bowel loops, no  bowel dilatation. Findings suggest ileus, less likely early bowel  obstruction. Moderate stool in the left and sigmoid colon with stool  balls in the rectum, no abnormal rectal distention.  2. No acute pulmonary process. Stable hyperinflation and suspected  emphysema.    ____________________________________________   PROCEDURES  Procedure(s) performed: None  Procedures  Critical Care performed: No  ____________________________________________   INITIAL IMPRESSION / ASSESSMENT AND PLAN / ED COURSE  Pertinent labs & imaging results that were available during my care of the patient were reviewed by me and considered in my medical decision making (see chart for details).  Alma Downssther M Ulrich is a 82 y.o. female with history of COPD with chronic home oxygen requirement presents for evaluation of diffuse abdominal pain and constipation, gradual onset, severe, ongoing for over a week, no modifying factors. On exam, she is nontoxic appearing and in no acute distress. Vital signs stable and she is afebrile. Her abdomen is soft, mildly distended and mildly diffusely tender. Her primary complaint is of constipation. She reports "just help me poop PLEASE". We'll obtain three-view abdominal plain films to rule out obstruction. We'll obtain screening labs, given her age and pain. EKG shows no cute ischemia and I doubt that her pain today represents atypical ACS. Reassess for disposition.  ----------------------------------------- 6:59 AM on 04/13/2016 ----------------------------------------- Labs reviewed. CBC with mild leukocytosis, white blood cell count is 11.6. Chronic stable anemia with hemoglobin improved from prior. Normal lipase. Negative troponin. Three-view of the abdomen  shows no obstruction, question ileus with increased stool burden and constipation. No acute cardiopulmonary process. I ordered an enema. If patient successfully stools and feels better and she continues to appear well, anticipate she can be discharged with close outpatient follow-up and MiraLAX. Care transferred to Dr. Juliette AlcideMelinda at this time. Clinical Course     ____________________________________________   FINAL CLINICAL IMPRESSION(S) / ED DIAGNOSES  Final diagnoses:  Generalized abdominal pain  Constipation, unspecified constipation type      NEW MEDICATIONS STARTED DURING THIS VISIT:  New Prescriptions   POLYETHYLENE GLYCOL POWDER (GLYCOLAX/MIRALAX) POWDER    Dissolve 1 tablespoon in 4-8 ounces of juice or water and drink once daily.  Note:  This document was prepared using Dragon voice recognition software and may include unintentional dictation errors.    Gayla Doss, MD 04/13/16 [phone]

## 2016-04-14 DIAGNOSIS — D649 Anemia, unspecified: Secondary | ICD-10-CM | POA: Diagnosis not present

## 2016-04-14 DIAGNOSIS — D72829 Elevated white blood cell count, unspecified: Secondary | ICD-10-CM | POA: Diagnosis not present

## 2016-04-14 DIAGNOSIS — R748 Abnormal levels of other serum enzymes: Secondary | ICD-10-CM | POA: Diagnosis not present

## 2016-04-14 DIAGNOSIS — E860 Dehydration: Secondary | ICD-10-CM | POA: Diagnosis not present

## 2016-04-14 DIAGNOSIS — Z9114 Patient's other noncompliance with medication regimen: Secondary | ICD-10-CM | POA: Diagnosis not present

## 2016-04-14 DIAGNOSIS — J449 Chronic obstructive pulmonary disease, unspecified: Secondary | ICD-10-CM | POA: Diagnosis not present

## 2016-04-16 ENCOUNTER — Telehealth: Payer: Self-pay | Admitting: Family Medicine

## 2016-04-16 NOTE — Telephone Encounter (Signed)
FyiClayton Lefort:  Lemuel with [facility] called saying he went to visit patient and she refused PT for today.   Pt not feeling well.  Call back number is [phone]  Thanks, Barth Kirksteri

## 2016-04-18 ENCOUNTER — Encounter: Payer: Medicare Other | Admitting: Physician Assistant

## 2016-04-18 ENCOUNTER — Inpatient Hospital Stay: Payer: Medicare Other | Admitting: Family Medicine

## 2016-04-21 ENCOUNTER — Inpatient Hospital Stay: Payer: Medicare Other | Admitting: Family Medicine

## 2016-04-21 NOTE — Telephone Encounter (Signed)
Please advise 

## 2016-04-21 NOTE — Telephone Encounter (Signed)
Can have any same say slot on Tuesday. Thanks.

## 2016-04-21 NOTE — Telephone Encounter (Signed)
Pt canceled her appt for today 11 am b/c her ride canceled on her today. Pt would like to be worked in later this week. Please advise. Thanks TNP

## 2016-04-21 NOTE — Telephone Encounter (Signed)
Pt wanted later in the week, possibly Thursday or Friday b/c she doesn't think she can get a ride any earlier this week. Please advise. Thanks TNP

## 2016-04-21 NOTE — Telephone Encounter (Signed)
Please advised. Thanks.

## 2016-04-22 ENCOUNTER — Ambulatory Visit (INDEPENDENT_AMBULATORY_CARE_PROVIDER_SITE_OTHER): Payer: Medicare Other | Admitting: Family Medicine

## 2016-04-22 ENCOUNTER — Encounter: Payer: Self-pay | Admitting: Family Medicine

## 2016-04-22 VITALS — BP 170/80 | HR 109 | Temp 98.4°F | Resp 20 | Wt 116.0 lb

## 2016-04-22 DIAGNOSIS — T796XXA Traumatic ischemia of muscle, initial encounter: Secondary | ICD-10-CM

## 2016-04-22 DIAGNOSIS — R748 Abnormal levels of other serum enzymes: Secondary | ICD-10-CM | POA: Diagnosis not present

## 2016-04-22 DIAGNOSIS — J9611 Chronic respiratory failure with hypoxia: Secondary | ICD-10-CM

## 2016-04-22 DIAGNOSIS — D649 Anemia, unspecified: Secondary | ICD-10-CM | POA: Diagnosis not present

## 2016-04-22 DIAGNOSIS — L0390 Cellulitis, unspecified: Secondary | ICD-10-CM | POA: Diagnosis not present

## 2016-04-22 DIAGNOSIS — R778 Other specified abnormalities of plasma proteins: Secondary | ICD-10-CM

## 2016-04-22 DIAGNOSIS — R7989 Other specified abnormal findings of blood chemistry: Secondary | ICD-10-CM

## 2016-04-22 DIAGNOSIS — I10 Essential (primary) hypertension: Secondary | ICD-10-CM | POA: Diagnosis not present

## 2016-04-22 MED ORDER — SULFAMETHOXAZOLE-TRIMETHOPRIM 800-160 MG PO TABS: 1.0000 | ORAL_TABLET | Freq: Two times a day (BID) | ORAL | 0 refills | Status: AC

## 2016-04-22 NOTE — Progress Notes (Signed)
[name] Female    DOB: [birth year]   82 y.o.   MRN: [id number] Visit Date: 04/22/2016  Today's Provider: Mila Merryonald Mycah Mcdougall, MD   Chief Complaint  Patient presents with  . Hospitalization Follow-up   Subjective:    HPI   Follow up Hospitalization  Patient was admitted to [facility] on 04/04/2016 and discharged on 04/08/2016. She was treated for rhabdomyolysis and elevated troponin after falling at home. She reports she had gotten up to go to the bathroom in the middle of the night and at some point passed. She does not remember what caused her to pass out, but she did not wake until the next day.   Her troponin I peaked at 2.41 and she was treated for possible non-STEMI. She had echo on 10/6 with normal EF of 65% and no wall motion abnormalities she was started atorvastatin and metoprolol but states they made her feel extremely fatigued so she stopped taking them.   She has felt relative well since discharge from hospital, but is somewhat fatigued and short of breath. She is on 2lpm O2 chronically with underlying COPD.   She also presented to ER on 10/15 with lower abdominal pain and was treated for constipation, and feeling much better.  ----------------------------------------------------------------  She also injured heel which has been manage by home health, but is no turning more red and more sore.    Allergies  Allergen Reactions  . Shellfish Allergy   . Codeine     headache  . Prednisone     Altered Mental Status  . Tetracycline      Current Outpatient Prescriptions:  .  albuterol (PROVENTIL HFA;VENTOLIN HFA) 108 (90 Base) MCG/ACT inhaler, Inhale 2 puffs into the lungs every 4 (four) hours as needed for wheezing or shortness of breath., Disp: 3 Inhaler, Rfl: 1 .  arformoterol (BROVANA) 15 MCG/2ML NEBU, Take 2 mLs (15 mcg total) by nebulization 2 (two) times daily., Disp: 360 mL, Rfl: 3 .  aspirin EC 81 MG EC tablet, Take 1 tablet (81 mg total) by mouth  daily., Disp: 30 tablet, Rfl: 0 .  cholecalciferol (VITAMIN D) 1000 UNITS tablet, Take 1,000 Units by mouth daily., Disp: , Rfl:  .  dorzolamide (TRUSOPT) 2 % ophthalmic solution, Place 1 drop into both eyes 2 (two) times daily. , Disp: , Rfl:  .  HYDROcodone-acetaminophen (NORCO/VICODIN) 5-325 MG tablet, Take 1 tablet by mouth every 6 (six) hours as needed for moderate pain., Disp: 30 tablet, Rfl: 0 .  latanoprost (XALATAN) 0.005 % ophthalmic solution, Place 1 drop into both eyes at bedtime., Disp: , Rfl:  .  Multiple Vitamins-Minerals (CENTRUM SILVER ADULT 50+ PO), Take 1 tablet by mouth daily., Disp: , Rfl:  .  polyethylene glycol powder (GLYCOLAX/MIRALAX) powder, Dissolve 1 tablespoon in 4-8 ounces of juice or water and drink once daily., Disp: 255 g, Rfl: 0 .  atorvastatin (LIPITOR) 40 MG tablet, Take 1 tablet (40 mg total) by mouth daily at 6 PM. (Patient not taking: Reported on 04/22/2016), Disp: 30 tablet, Rfl: 0 .  metoprolol succinate (TOPROL-XL) 25 MG 24 hr tablet, Take 0.5 tablets (12.5 mg total) by mouth daily. (Patient not taking: Reported on 04/22/2016), Disp: 15 tablet, Rfl: 0  Review of Systems  Constitutional: Negative for appetite change, chills, fatigue and fever.  Respiratory: Positive for shortness of breath and wheezing. Negative for chest tightness.   Cardiovascular: Negative for chest pain and palpitations.  Gastrointestinal: Negative for abdominal pain,  nausea and vomiting.  Neurological: Negative for dizziness and weakness.    Social History  Substance Use Topics  . Smoking status: Former Smoker    Packs/day: 1.00    Years: 50.00    Types: Cigarettes    Quit date: 06/30/2000  . Smokeless tobacco: Never Used  . Alcohol use No   Objective:   BP (!) 170/80 (BP Location: Left Arm, Patient Position: Sitting, Cuff Size: Normal)   Pulse (!) 109   Temp 98.4 F (36.9 C) (Oral)   Resp 20   Wt 116 lb (52.6 kg)   SpO2 96%   BMI 21.22 kg/m  2lpm Oxygen.  Physical  Exam   General Appearance:    Alert, cooperative, no distress  Eyes:    PERRL, conjunctiva/corneas clear, EOM's intact       Lungs:     Clear to auscultation bilaterally, respirations unlabored  Heart:    Regular rate and rhythm  Neurologic:   Awake, alert, oriented x 3. No apparent focal neurological           defect.   Derm::   Some inflammation and warmth with scant drainage of heel.        Assessment & Plan:     1. Anemia, unspecified type -CBC  2. Cellulitis, unspecified cellulitis site (Heel) - CBC - sulfamethoxazole-trimethoprim (BACTRIM DS,SEPTRA DS) 800-160 MG tablet; Take 1 tablet by mouth 2 (two) times daily.  Dispense: 14 tablet; Refill: 0  3. Chronic hypoxemic respiratory failure (HCC) Stable on current oxygen  4. Traumatic rhabdomyolysis, initial encounter (HCC) Resolved. She is in process of installing [employer]  5. Elevated troponin This was likely secondary to rhabdomyolysis. She is very resistant to taking the atorvastatin, but I think she should continue taking betablocker for hypertension as below.   6. Hypertension She feels very fatigued taking just 1/2 of 25mg  metoprolol, advised her to try taking this in the evening instead of the morning.   Return in about 1 week (around 04/29/2016).  Addressed extensive list of chronic and acute medical problems today requiring extensive time in counseling and coordination care.  Over half of this 40 minute visit were spent in counseling and coordinating care of multiple medical problems.       Mila Merry, MD  [facility]

## 2016-04-23 ENCOUNTER — Telehealth: Payer: Self-pay | Admitting: Family Medicine

## 2016-04-23 NOTE — Telephone Encounter (Signed)
She should keep it on whenever she is up walking around. She can take it off if she likes when she is off of her feet.

## 2016-04-23 NOTE — Telephone Encounter (Signed)
Pt states she was seen yesterday and is asking when can she take the bandage off of her foot.  CB#[phone]/MW

## 2016-04-23 NOTE — Telephone Encounter (Signed)
Patient advised and verbally voiced understanding.  

## 2016-04-23 NOTE — Telephone Encounter (Signed)
Please advise 

## 2016-04-24 LAB — CBC
Hematocrit: 35.8 % (ref 34.0–46.6)
Hemoglobin: 11.6 g/dL (ref 11.1–15.9)
MCH: 31 pg (ref 26.6–33.0)
MCHC: 32.4 g/dL (ref 31.5–35.7)
MCV: 96 fL (ref 79–97)
PLATELETS: 291 10*3/uL (ref 150–379)
RBC: 3.74 x10E6/uL — AB (ref 3.77–5.28)
RDW: 15.5 % — AB (ref 12.3–15.4)
WBC: 6.3 10*3/uL (ref 3.4–10.8)

## 2016-04-25 ENCOUNTER — Telehealth: Payer: Self-pay | Admitting: *Deleted

## 2016-04-25 MED ORDER — CEPHALEXIN 500 MG PO CAPS: 500.0000 mg | ORAL_CAPSULE | Freq: Three times a day (TID) | ORAL | 0 refills | Status: DC

## 2016-04-25 NOTE — Telephone Encounter (Signed)
Patient was notified and rx was sent to pharmacy.

## 2016-04-25 NOTE — Telephone Encounter (Signed)
Change to cephalexin 500mg  three times daily for 10 days.

## 2016-04-25 NOTE — Telephone Encounter (Signed)
Patient stated that she took sulfamethoxazole-trim antibiotic for a few days. Patient stated that she became very dizzy while taking the med. Patient said that she stopped med for a day and the dizziness went away. Patient does not want to take sulfa-trim med anymore. Patient wants to know if she needs another antibiotic? She said her foot looks better. Please advise?

## 2016-04-28 ENCOUNTER — Telehealth: Payer: Self-pay | Admitting: Family Medicine

## 2016-04-28 NOTE — Telephone Encounter (Addendum)
Please advise? Patient has an ov with us 11/1.

## 2016-04-28 NOTE — Telephone Encounter (Signed)
[name] with [facility] called saying she thinks [name] [name] needs a visit with the wound care center for her heel wound infection and pain.    Please call back at [phone]  Thanks Barth Kirkseri

## 2016-04-29 NOTE — Telephone Encounter (Signed)
LMOVM for Arline AspCindy to return call.

## 2016-04-29 NOTE — Telephone Encounter (Signed)
[name] with [facility] returned Michelle's call.

## 2016-04-29 NOTE — Telephone Encounter (Signed)
[name] was notified

## 2016-04-29 NOTE — Telephone Encounter (Signed)
Ok, we will take a look at at her OV tomorrow and do referral if needed.

## 2016-04-30 ENCOUNTER — Ambulatory Visit (INDEPENDENT_AMBULATORY_CARE_PROVIDER_SITE_OTHER): Payer: Medicare Other | Admitting: Family Medicine

## 2016-04-30 ENCOUNTER — Telehealth: Payer: Self-pay

## 2016-04-30 ENCOUNTER — Encounter: Payer: Self-pay | Admitting: Family Medicine

## 2016-04-30 VITALS — BP 164/78 | HR 112 | Temp 98.6°F | Resp 24 | Wt 116.0 lb

## 2016-04-30 DIAGNOSIS — L89613 Pressure ulcer of right heel, stage 3: Secondary | ICD-10-CM | POA: Diagnosis not present

## 2016-04-30 DIAGNOSIS — R0602 Shortness of breath: Secondary | ICD-10-CM | POA: Diagnosis not present

## 2016-04-30 DIAGNOSIS — J449 Chronic obstructive pulmonary disease, unspecified: Secondary | ICD-10-CM

## 2016-04-30 MED ORDER — CEPHALEXIN 500 MG PO CAPS: 500.0000 mg | ORAL_CAPSULE | Freq: Three times a day (TID) | ORAL | 0 refills | Status: DC

## 2016-04-30 NOTE — Progress Notes (Signed)
[name] Female    DOB: Mar 25, 1930   82 y.o.   MRN: [id number] Visit Date: 04/30/2016  Today's Provider: Mila Merryonald Fisher, MD   Chief Complaint  Patient presents with  . Cellulitis    1 week follow up.    Subjective:    HPI  Patient is here for a follow up on celluitis. Patient was seen here in the office on 04/22/16 and was prescribed Septra DS. Patient was later changed to Cephalexin 500mg  TID X 10 days on 04/25/16 due to having side effects from Septra. Patient reports that she has been tolerating the medication well.  States she has had more trouble breathing since hospitalization last month. She was hospitalized 10/6 through 10/9 after falling at home and developing rhabdomyolysis. She had elevated troponin and though possibly to have had MI, but echocardiogram was normal with EF 60-65%. Hgb last week was 11.4. She was previously followed by Dr. Kendrick FriesMcquaid for COPD but she has not been able to follow up with him since [facility].     Allergies  Allergen Reactions  . Shellfish Allergy   . Codeine     headache  . Prednisone     Altered Mental Status  . Tetracycline      Current Outpatient Prescriptions:  .  albuterol (PROVENTIL HFA;VENTOLIN HFA) 108 (90 Base) MCG/ACT inhaler, Inhale 2 puffs into the lungs every 4 (four) hours as needed for wheezing or shortness of breath., Disp: 3 Inhaler, Rfl: 1 .  arformoterol (BROVANA) 15 MCG/2ML NEBU, Take 2 mLs (15 mcg total) by nebulization 2 (two) times daily., Disp: 360 mL, Rfl: 3 .  aspirin EC 81 MG EC tablet, Take 1 tablet (81 mg total) by mouth daily., Disp: 30 tablet, Rfl: 0 .  cephALEXin (KEFLEX) 500 MG capsule, Take 1 capsule (500 mg total) by mouth 3 (three) times daily. For 10 days, Disp: 30 capsule, Rfl: 0 .  cholecalciferol (VITAMIN D) 1000 UNITS tablet, Take 1,000 Units by mouth daily., Disp: , Rfl:  .  dorzolamide (TRUSOPT) 2 % ophthalmic solution, Place 1 drop into both eyes 2 (two) times  daily. , Disp: , Rfl:  .  HYDROcodone-acetaminophen (NORCO/VICODIN) 5-325 MG tablet, Take 1 tablet by mouth every 6 (six) hours as needed for moderate pain., Disp: 30 tablet, Rfl: 0 .  latanoprost (XALATAN) 0.005 % ophthalmic solution, Place 1 drop into both eyes at bedtime., Disp: , Rfl:  .  Multiple Vitamins-Minerals (CENTRUM SILVER ADULT 50+ PO), Take 1 tablet by mouth daily., Disp: , Rfl:  .  polyethylene glycol powder (GLYCOLAX/MIRALAX) powder, Dissolve 1 tablespoon in 4-8 ounces of juice or water and drink once daily., Disp: 255 g, Rfl: 0 .  metoprolol succinate (TOPROL-XL) 25 MG 24 hr tablet, Take 0.5 tablets (12.5 mg total) by mouth daily. (Patient not taking: Reported on 04/30/2016), Disp: 15 tablet, Rfl: 0  Review of Systems  Constitutional: Negative.   Respiratory: Positive for shortness of breath and wheezing.   Cardiovascular: Negative.   Skin: Positive for wound.  Neurological: Positive for dizziness.       Comes and goes.     Social History  Substance Use Topics  . Smoking status: Former Smoker    Packs/day: 1.00    Years: 50.00    Types: Cigarettes    Quit date: 06/30/2000  . Smokeless tobacco: Never Used  . Alcohol use No   Objective:   BP (!) 164/78 (BP Location: Left Arm,  Patient Position: Sitting, Cuff Size: Normal)   Pulse (!) 112   Temp 98.6 F (37 C)   Resp (!) 24   Wt 116 lb (52.6 kg)   SpO2 99% Comment: with 2L of O2  BMI 21.22 kg/m   Physical Exam   General Appearance:    Alert, cooperative, no distress, very anxious appearing  Eyes:    PERRL, conjunctiva/corneas clear, EOM's intact       Lungs:     Clear to auscultation bilaterally, respirations unlabored  Heart:    Regular rate and rhythm  Neurologic:   Awake, alert, oriented x 3. No apparent focal neurological           defect.   Skin:   Stage III pressure ulcer right heal with mild surrounding erythema and scant discharge with borders of erythema retreating since last visit.          Assessment & Plan:     1. Chronic obstructive pulmonary disease, unspecified COPD type (HCC) Refer to new pulmonologist since Dr. Kendrick FriesMcQuaid is no longer coming to [place]  - Ambulatory referral to Pulmonology  2. Decubitus ulcer of right heel, stage 3 (HCC)  - Ambulatory referral to Wound Clinic  3. Shortness of breath No signs of CHF and oxygenating well. Suspect some component of anxiety.  - Ambulatory referral to Pulmonology.        Mila Merryonald Fisher, MD  [facility]

## 2016-04-30 NOTE — Telephone Encounter (Signed)
Clayton [facility] called stating patient has been refusing home health physical therapy visits on multiple occasions. She has been telling them that she has an infection in her foot and she is not able to do any physical therapy exercises. Clayton LefortLemuel would like verbal approval for non- visit discharge. Call back is [phone].

## 2016-05-06 ENCOUNTER — Encounter: Payer: Self-pay | Admitting: Family Medicine

## 2016-05-06 ENCOUNTER — Ambulatory Visit (INDEPENDENT_AMBULATORY_CARE_PROVIDER_SITE_OTHER): Payer: Medicare Other | Admitting: Family Medicine

## 2016-05-06 VITALS — BP 144/76 | HR 104 | Temp 99.5°F | Resp 24

## 2016-05-06 DIAGNOSIS — L97519 Non-pressure chronic ulcer of other part of right foot with unspecified severity: Secondary | ICD-10-CM | POA: Diagnosis not present

## 2016-05-06 NOTE — Progress Notes (Signed)
[name] Female    DOB: 08/15/1929   82 y.o.   MRN: [id number] Visit Date: 05/06/2016  Today's Provider: Mila Merryonald Veida Spira, MD   Chief Complaint  Patient presents with  . Foot Pain   Subjective:    HPI Patient comes in today to have her right foot evaluated. Patient reports that she had her dressing changed on Sunday (2 days ago) and she reports that she has more pain than ever before. She reports that she has a stabbing sensation in her heel. She was previously prescribed cephalexin which she has finished but wound has still not healed.     Allergies  Allergen Reactions  . Shellfish Allergy   . Codeine     headache  . Prednisone     Altered Mental Status  . Tetracycline      Current Outpatient Prescriptions:  .  albuterol (PROVENTIL HFA;VENTOLIN HFA) 108 (90 Base) MCG/ACT inhaler, Inhale 2 puffs into the lungs every 4 (four) hours as needed for wheezing or shortness of breath., Disp: 3 Inhaler, Rfl: 1 .  arformoterol (BROVANA) 15 MCG/2ML NEBU, Take 2 mLs (15 mcg total) by nebulization 2 (two) times daily., Disp: 360 mL, Rfl: 3 .  aspirin EC 81 MG EC tablet, Take 1 tablet (81 mg total) by mouth daily., Disp: 30 tablet, Rfl: 0 .  cephALEXin (KEFLEX) 500 MG capsule, Take 1 capsule (500 mg total) by mouth 3 (three) times daily. For 10 days, Disp: 30 capsule, Rfl: 0 .  cholecalciferol (VITAMIN D) 1000 UNITS tablet, Take 1,000 Units by mouth daily., Disp: , Rfl:  .  dorzolamide (TRUSOPT) 2 % ophthalmic solution, Place 1 drop into both eyes 2 (two) times daily. , Disp: , Rfl:  .  HYDROcodone-acetaminophen (NORCO/VICODIN) 5-325 MG tablet, Take 1 tablet by mouth every 6 (six) hours as needed for moderate pain., Disp: 30 tablet, Rfl: 0 .  latanoprost (XALATAN) 0.005 % ophthalmic solution, Place 1 drop into both eyes at bedtime., Disp: , Rfl:  .  metoprolol succinate (TOPROL-XL) 25 MG 24 hr tablet, Take 0.5 tablets (12.5 mg total) by mouth daily. (Patient not taking:  Reported on 04/30/2016), Disp: 15 tablet, Rfl: 0 .  Multiple Vitamins-Minerals (CENTRUM SILVER ADULT 50+ PO), Take 1 tablet by mouth daily., Disp: , Rfl:  .  polyethylene glycol powder (GLYCOLAX/MIRALAX) powder, Dissolve 1 tablespoon in 4-8 ounces of juice or water and drink once daily., Disp: 255 g, Rfl: 0  Review of Systems  Constitutional: Positive for activity change. Negative for appetite change, chills, diaphoresis, fatigue, fever and unexpected weight change.  Cardiovascular: Negative for leg swelling.  Skin: Positive for wound.    Social History  Substance Use Topics  . Smoking status: Former Smoker    Packs/day: 1.00    Years: 50.00    Types: Cigarettes    Quit date: 06/30/2000  . Smokeless tobacco: Never Used  . Alcohol use No   Objective:   BP (!) 144/76 (BP Location: Right Arm, Patient Position: Sitting, Cuff Size: Normal)   Pulse (!) 104   Temp 99.5 F (37.5 C)   Resp (!) 24   Physical Exam  Nickel sized stage II ulcer right heel with scant discharge.     Assessment & Plan:     1. Ulcer of right foot, unspecified ulcer stage (HCC)  - Wound culture     The entirety of the information documented in the History of Present Illness, Review of Systems and Physical Exam  were personally obtained by me. Portions of this information were initially documented by Anson Oregonachelle Presley, CMA and reviewed by me for thoroughness and accuracy.    Mila Merryonald Iylah Dworkin, MD  [facility]

## 2016-05-08 ENCOUNTER — Ambulatory Visit: Payer: Medicare Other | Admitting: Surgery

## 2016-05-08 DIAGNOSIS — J449 Chronic obstructive pulmonary disease, unspecified: Secondary | ICD-10-CM | POA: Diagnosis not present

## 2016-05-08 DIAGNOSIS — E860 Dehydration: Secondary | ICD-10-CM | POA: Diagnosis not present

## 2016-05-08 DIAGNOSIS — Z9114 Patient's other noncompliance with medication regimen: Secondary | ICD-10-CM | POA: Diagnosis not present

## 2016-05-08 DIAGNOSIS — D72829 Elevated white blood cell count, unspecified: Secondary | ICD-10-CM | POA: Diagnosis not present

## 2016-05-08 DIAGNOSIS — M6282 Rhabdomyolysis: Secondary | ICD-10-CM | POA: Diagnosis not present

## 2016-05-08 DIAGNOSIS — R748 Abnormal levels of other serum enzymes: Secondary | ICD-10-CM | POA: Diagnosis not present

## 2016-05-09 ENCOUNTER — Telehealth: Payer: Self-pay

## 2016-05-09 LAB — WOUND CULTURE: Organism ID, Bacteria: NONE SEEN

## 2016-05-09 MED ORDER — AMOXICILLIN 500 MG PO CAPS: 500.0000 mg | ORAL_CAPSULE | Freq: Two times a day (BID) | ORAL | 0 refills | Status: DC

## 2016-05-09 NOTE — Telephone Encounter (Signed)
-----   Message from Malva Limesonald E Fisher, MD sent at 05/09/2016 12:20 PM EST ----- Wound culture is positive for enterococcus. Need to change antibiotic  To amoxicillin 500mg  two tablets twice a day for 10 days. Follow up in one week.

## 2016-05-09 NOTE — Telephone Encounter (Signed)
Patient has been advised and prescription  Was electronically sent. KW

## 2016-05-12 ENCOUNTER — Institutional Professional Consult (permissible substitution): Payer: Medicare Other | Admitting: Internal Medicine

## 2016-05-14 ENCOUNTER — Encounter: Payer: Medicare Other | Attending: Internal Medicine | Admitting: Internal Medicine

## 2016-05-14 DIAGNOSIS — I10 Essential (primary) hypertension: Secondary | ICD-10-CM | POA: Diagnosis not present

## 2016-05-14 DIAGNOSIS — Z888 Allergy status to other drugs, medicaments and biological substances status: Secondary | ICD-10-CM | POA: Insufficient documentation

## 2016-05-14 DIAGNOSIS — J449 Chronic obstructive pulmonary disease, unspecified: Secondary | ICD-10-CM | POA: Insufficient documentation

## 2016-05-14 DIAGNOSIS — L89612 Pressure ulcer of right heel, stage 2: Secondary | ICD-10-CM | POA: Insufficient documentation

## 2016-05-14 DIAGNOSIS — Z87891 Personal history of nicotine dependence: Secondary | ICD-10-CM | POA: Diagnosis not present

## 2016-05-14 DIAGNOSIS — M1990 Unspecified osteoarthritis, unspecified site: Secondary | ICD-10-CM | POA: Diagnosis not present

## 2016-05-14 DIAGNOSIS — Z885 Allergy status to narcotic agent status: Secondary | ICD-10-CM | POA: Insufficient documentation

## 2016-05-14 DIAGNOSIS — Z9981 Dependence on supplemental oxygen: Secondary | ICD-10-CM | POA: Insufficient documentation

## 2016-05-16 ENCOUNTER — Encounter: Payer: Self-pay | Admitting: Family Medicine

## 2016-05-16 ENCOUNTER — Ambulatory Visit (INDEPENDENT_AMBULATORY_CARE_PROVIDER_SITE_OTHER): Payer: Medicare Other | Admitting: Family Medicine

## 2016-05-16 VITALS — BP 144/62 | HR 116 | Temp 98.6°F | Resp 24 | Wt 116.0 lb

## 2016-05-16 DIAGNOSIS — L97519 Non-pressure chronic ulcer of other part of right foot with unspecified severity: Secondary | ICD-10-CM | POA: Diagnosis not present

## 2016-05-16 DIAGNOSIS — Z23 Encounter for immunization: Secondary | ICD-10-CM | POA: Diagnosis not present

## 2016-05-16 NOTE — Progress Notes (Signed)
[name] [name], [name] M. ([id number]) Visit Report for 05/14/2016 Abuse/Suicide Risk Screen Details Patient Name: [name] [name], [name] M. Date of Service: 05/14/2016 9:45 AM Medical Record Patient Account Number: [id number] Number: Treating RN: Phillis Haggisinkerton, Debi [birth year] (82 y.o. Other Clinician: Date of Birth/Sex: Female) Treating ROBSON, MICHAEL Primary Care Physician/Extender: Uvaldo RisingG FISHER, DONALD Physician: Referring Physician: Genia DelFISHER, DONALD Weeks in Treatment: 0 Abuse/Suicide Risk Screen Items Answer ABUSE/SUICIDE RISK SCREEN: Has anyone close to you tried to hurt or harm you recentlyo No Do you feel uncomfortable with anyone in your familyo No Has anyone forced you do things that you didnot want to doo No Do you have any thoughts of harming yourselfo No Patient displays signs or symptoms of abuse and/or neglect. No Electronic Signature(s) Signed: 05/15/2016 4:54:36 PM By: [name] [name] Entered By: [name] [name] on 05/14/2016 10:22:51 [name], [name] ([id number]) -------------------------------------------------------------------------------- Activities of Daily Living Details Patient Name: [name] [name], [name] M. Date of Service: 05/14/2016 9:45 AM Medical Record Patient Account Number: [id number] Number: Treating RN: Phillis Haggisinkerton, Debi [birth year] (82 y.o. Other Clinician: Date of Birth/Sex: Female) Treating ROBSON, MICHAEL Primary Care Physician/Extender: Uvaldo RisingG FISHER, DONALD Physician: Referring Physician: Genia DelFISHER, DONALD Weeks in Treatment: 0 Activities of Daily Living Items Answer Activities of Daily Living (Please select one for each item) Drive Automobile Completely Able Take Medications Completely Able Use Telephone Completely Able Care for Appearance Completely Able Use Toilet Completely Able Bath / Shower Completely Able Dress Self Completely Able Feed Self Completely Able Walk Completely Able Get In / Out Bed Completely Able Housework Completely Able Prepare  Meals Completely Able Handle Money Completely Able Shop for Self Completely Able Electronic Signature(s) Signed: 05/15/2016 4:54:36 PM By: [name] [name] Entered By: [name] [name] on 05/14/2016 10:23:27 [name], [name] ([id number]) -------------------------------------------------------------------------------- Education Assessment Details Patient Name: [name] [name], [name] M. Date of Service: 05/14/2016 9:45 AM Medical Record Patient Account Number: [id number] Number: Treating RN: Phillis Haggisinkerton, Debi [birth year] (82 y.o. Other Clinician: Date of Birth/Sex: Female) Treating ROBSON, MICHAEL Primary Care Physician/Extender: Uvaldo RisingG FISHER, DONALD Physician: Referring Physician: Genia DelFISHER, DONALD Weeks in Treatment: 0 Primary Learner Assessed: Patient Learning Preferences/Education Level/Primary Language Learning Preference: Explanation, Printed Material Highest Education Level: College or Above Preferred Language: English Cognitive Barrier Assessment/Beliefs Language Barrier: No Translator Needed: No Memory Deficit: No Emotional Barrier: No Cultural/Religious Beliefs Affecting Medical No Care: Physical Barrier Assessment Impaired Vision: Yes Glasses Impaired Hearing: Yes HOH Knowledge/Comprehension Assessment Knowledge Level: High Comprehension Level: High Ability to understand written High instructions: Ability to understand verbal High instructions: Motivation Assessment Anxiety Level: Calm Cooperation: Cooperative Education Importance: Acknowledges Need Interest in Health Problems: Asks Questions Perception: Coherent Willingness to Engage in Self- High Management Activities: Readiness to Engage in Self- High Management Activities: [name] [name], [name] M. ([id number]) Electronic Signature(s) Signed: 05/15/2016 4:54:36 PM By: [name] [name] Entered By: [name] [name] on 05/14/2016 10:23:55 [name], [name]  ([id number]) -------------------------------------------------------------------------------- Fall Risk Assessment Details Patient Name: [name] [name], [name] M. Date of Service: 05/14/2016 9:45 AM Medical Record Patient Account Number: [id number] Number: Treating RN: Phillis Haggisinkerton, Debi [birth year] (82 y.o. Other Clinician: Date of Birth/Sex: Female) Treating ROBSON, MICHAEL Primary Care Physician/Extender: Uvaldo RisingG FISHER, DONALD Physician: Referring Physician: Genia DelFISHER, DONALD Weeks in Treatment: 0 Fall Risk Assessment Items Have you had 2 or more falls in the last 12 monthso 0 No Have you had any fall that resulted in injury in the last 12 monthso 0 Yes FALL RISK ASSESSMENT: History of falling - immediate or within 3 months 25 Yes Secondary diagnosis 0 No Ambulatory aid None/bed rest/wheelchair/nurse 0 No Crutches/cane/walker  0 No Furniture 0 No IV Access/Saline Lock 0 No Gait/Training Normal/bed rest/immobile 0 No Weak 0 No Impaired 0 No Mental Status Oriented to own ability 0 Yes Electronic Signature(s) Signed: 05/15/2016 4:54:36 PM By: [name] [name] Entered By: [name] [name] on 05/14/2016 10:24:50 [name], [name] ([id number]) -------------------------------------------------------------------------------- Foot Assessment Details Patient Name: [name] [name], [name] M. Date of Service: 05/14/2016 9:45 AM Medical Record Patient Account Number: [id number] Number: Treating RN: Phillis Haggisinkerton, Debi 07-16-1929 (82 y.o. Other Clinician: Date of Birth/Sex: Female) Treating ROBSON, MICHAEL Primary Care Physician/Extender: Uvaldo RisingG FISHER, DONALD Physician: Referring Physician: Genia DelFISHER, DONALD Weeks in Treatment: 0 Foot Assessment Items Site Locations + = Sensation present, - = Sensation absent, C = Callus, U = Ulcer R = Redness, W = Warmth, M = Maceration, PU = Pre-ulcerative lesion F = Fissure, S = Swelling, D = Dryness Assessment Right: Left: Other Deformity: No No Prior Foot Ulcer:  No No Prior Amputation: No No Charcot Joint: No No Ambulatory Status: Ambulatory Without Help Gait: Steady Electronic Signature(s) Signed: 05/15/2016 4:54:36 PM By: [name] [name] Entered By: [name] [name] on 05/14/2016 10:26:00 Popoff, [name] ([id number]) Hartl, Audrena M. ([id number]) -------------------------------------------------------------------------------- Nutrition Risk Assessment Details Patient Name: Caton, [name] Date of Service: 05/14/2016 9:45 AM Medical Record Patient Account Number: [id number] Number: Treating RN: Phillis Haggisinkerton, Debi 07-16-1929 (82 y.o. Other Clinician: Date of Birth/Sex: Female) Treating ROBSON, MICHAEL Primary Care Physician/Extender: Uvaldo RisingG FISHER, DONALD Physician: Referring Physician: Genia DelFISHER, DONALD Weeks in Treatment: 0 Height (in): 62 Weight (lbs): 115 Body Mass Index (BMI): 21 Nutrition Risk Assessment Items NUTRITION RISK SCREEN: I have an illness or condition that made me change the kind and/or 0 No amount of food I eat I eat fewer than two meals per day 0 No I eat few fruits and vegetables, or milk products 0 No I have three or more drinks of beer, liquor or wine almost every day 0 No I have tooth or mouth problems that make it hard for me to eat 0 No I don't always have enough money to buy the food I need 0 No I eat alone most of the time 0 No I take three or more different prescribed or over-the-counter drugs a 1 Yes day Without wanting to, I have lost or gained 10 pounds in the last six 0 No months I am not always physically able to shop, cook and/or feed myself 0 No Nutrition Protocols Good Risk Protocol Moderate Risk Protocol Electronic Signature(s) Signed: 05/15/2016 4:54:36 PM By: [name] [name] Entered By: [name] [name] on 05/14/2016 10:25:04

## 2016-05-16 NOTE — Progress Notes (Signed)
[name] [name] ([id number]) Visit Report for 05/14/2016 Allergy List Details Patient Name: [name], [name]. Date of Service: 05/14/2016 9:45 AM Medical Record Patient Account Number: [id number] Number: Treating RN: Phillis Haggis Sep 05, 1929 (82 y.o. Other Clinician: Date of Birth/Sex: Female) Treating ROBSON, MICHAEL Primary Care Physician: Mila Merry Physician/Extender: G Referring Physician: Genia Del in Treatment: 0 Allergies Active Allergies shellfish derived sulfur codeine prednisone tetracycline Allergy Notes Electronic Signature(s) Signed: 05/15/2016 4:54:36 PM By: Alejandro Mulling Entered By: Alejandro Mulling on 05/14/2016 10:14:40 [name] [name] [name] ([id number]) -------------------------------------------------------------------------------- Arrival Information Details Patient Name: [name] [name]. Date of Service: 05/14/2016 9:45 AM Medical Record Patient Account Number: [id number] Number: Treating RN: Phillis Haggis 30-Jan-1930 (82 y.o. Other Clinician: Date of Birth/Sex: Female) Treating ROBSON, MICHAEL Primary Care Physician: Mila Merry Physician/Extender: G Referring Physician: Genia Del in Treatment: 0 Visit Information Patient Arrived: Ambulatory Arrival Time: 10:10 Accompanied By: self Transfer Assistance: None Patient Identification Verified: Yes Secondary Verification Process Yes Completed: Patient Requires Transmission-Based No Precautions: Patient Has Alerts: No Electronic Signature(s) Signed: 05/15/2016 4:54:36 PM By: Alejandro Mulling Entered By: Alejandro Mulling on 05/14/2016 10:10:43 [name] [name] [name] ([id number]) -------------------------------------------------------------------------------- Clinic Level of Care Assessment Details Patient Name: [name] [name]. Date of Service: 05/14/2016 9:45 AM Medical Record Patient Account Number: [id number] Number: Treating RN: Phillis Haggis Aug 16, 1929 (82 y.o. Other Clinician: Date of Birth/Sex: Female) Treating ROBSON, MICHAEL Primary Care Physician: Mila Merry Physician/Extender: G Referring Physician: Genia Del in Treatment: 0 Clinic Level of Care Assessment Items TOOL 1 Quantity Score X - Use when EandM and Procedure is performed on INITIAL visit 1 0 ASSESSMENTS - Nursing Assessment / Reassessment X - General Physical Exam (combine w/ comprehensive assessment (listed just 1 20 below) when performed on new pt. evals) X - Comprehensive Assessment (HX, ROS, Risk Assessments, Wounds Hx, etc.) 1 25 ASSESSMENTS - Wound and Skin Assessment / Reassessment []  - Dermatologic / Skin Assessment (not related to wound area) 0 ASSESSMENTS - Ostomy and/or Continence Assessment and Care []  - Incontinence Assessment and Management 0 []  - Ostomy Care Assessment and Management (repouching, etc.) 0 PROCESS - Coordination of Care X - Simple Patient / Family Education for ongoing care 1 15 []  - Complex (extensive) Patient / Family Education for ongoing care 0 X - Staff obtains Consents, Records, Test Results / Process Orders 1 10 []  - Staff telephones HHA, Nursing Homes / Clarify orders / etc 0 []  - Routine Transfer to another Facility (non-emergent condition) 0 []  - Routine Hospital Admission (non-emergent condition) 0 X - New Admissions / Manufacturing engineer / Ordering NPWT, Apligraf, etc. 1 15 []  - Emergency Hospital Admission (emergent condition) 0 PROCESS - Special Needs []  - Pediatric / Minor Patient Management 0 Choe, Evangelyne M. ([id number]) []  - Isolation Patient Management 0 []  - Hearing / Language / Visual special needs 0 []  - Assessment of Community assistance (transportation, D/C planning, etc.) 0 []  - Additional assistance / Altered mentation 0 []  - Support Surface(s) Assessment (bed, cushion, seat, etc.) 0 INTERVENTIONS - Miscellaneous []  - External ear exam 0 []  - Patient Transfer (multiple staff  / Nurse, adult / Similar devices) 0 []  - Simple Staple / Suture removal (25 or less) 0 []  - Complex Staple / Suture removal (26 or more) 0 []  - Hypo/Hyperglycemic Management (do not check if billed separately) 0 X - Ankle / Brachial Index (ABI) - do not check if billed separately 1 15 Has the patient been seen at the hospital  within the last three years: Yes Total Score: 100 Level Of Care: New/Established - Level 3 Electronic Signature(s) Signed: 05/15/2016 4:54:36 PM By: Alejandro Mulling Entered By: Alejandro Mulling on 05/14/2016 16:44:02 [name] [name] [name] ([id number]) -------------------------------------------------------------------------------- Encounter Discharge Information Details Patient Name: [name] [name]. Date of Service: 05/14/2016 9:45 AM Medical Record Patient Account Number: [id number] Number: Treating RN: Phillis Haggis 12-Jun-1930 (82 y.o. Other Clinician: Date of Birth/Sex: Female) Treating ROBSON, MICHAEL Primary Care Physician: Mila Merry Physician/Extender: G Referring Physician: Genia Del in Treatment: 0 Encounter Discharge Information Items Discharge Pain Level: 0 Discharge Condition: Stable Ambulatory Status: Ambulatory Discharge Destination: Home Transportation: Private Auto Accompanied By: self Schedule Follow-up Appointment: Yes Medication Reconciliation completed and provided to Patient/Care No John Vasconcelos: Provided on Clinical Summary of Care: 05/14/2016 Form Type Recipient Paper Patient EL Electronic Signature(s) Signed: 05/15/2016 4:54:36 PM By: Alejandro Mulling Previous Signature: 05/14/2016 11:22:00 AM Version By: Gwenlyn Perking Entered By: Alejandro Mulling on 05/14/2016 12:44:11 [name] [name] ([id number]) -------------------------------------------------------------------------------- Lower Extremity Assessment Details Patient Name: [name] [name]. Date of Service: 05/14/2016 9:45 AM Medical Record Patient Account  Number: [id number] Number: Treating RN: Phillis Haggis [birth year] (82 y.o. Other Clinician: Date of Birth/Sex: Female) Treating ROBSON, MICHAEL Primary Care Physician: Mila Merry Physician/Extender: G Referring Physician: Genia Del in Treatment: 0 Edema Assessment Assessed: [Left: No] [Right: No] E[Left: dema] [Right: :] Calf Left: Right: Point of Measurement: 28 cm From Medial Instep 31.2 cm 30 cm Ankle Left: Right: Point of Measurement: 9 cm From Medial Instep 18.6 cm 18.2 cm Vascular Assessment Pulses: Posterior Tibial Dorsalis Pedis Palpable: [Left:Yes] [Right:No] Doppler: [Left:Multiphasic] [Right:Multiphasic] Extremity colors, hair growth, and conditions: Extremity Color: [Left:Mottled] [Right:Mottled] Hair Growth on Extremity: [Left:No] [Right:No] Temperature of Extremity: [Left:Warm] [Right:Warm] Capillary Refill: [Left:> 3 seconds] [Right:> 3 seconds] Blood Pressure: Brachial: [Left:150] [Right:150] Dorsalis Pedis: 130 [Left:Dorsalis Pedis: 120] Ankle: Posterior Tibial: 118 [Left:Posterior Tibial: 140 0.87] [Right:0.93] Toe Nail Assessment Left: Right: Thick: No No Discolored: No No Deformed: No No Improper Length and Hygiene: Yes Yes [name], [name] ([id number]) Electronic Signature(s) Signed: 05/15/2016 4:54:36 PM By: Alejandro Mulling Entered By: Alejandro Mulling on 05/14/2016 10:39:25 [name] [name] [name] ([id number]) -------------------------------------------------------------------------------- Multi Wound Chart Details Patient Name: [name] [name]. Date of Service: 05/14/2016 9:45 AM Medical Record Patient Account Number: [id number] Number: Treating RN: Phillis Haggis 1930-05-09 (82 y.o. Other Clinician: Date of Birth/Sex: Female) Treating ROBSON, MICHAEL Primary Care Physician: Mila Merry Physician/Extender: G Referring Physician: Genia Del in Treatment: 0 Vital Signs Height(in): 62 Pulse(bpm):  70 Weight(lbs): 115 Blood Pressure 142/60 (mmHg): Body Mass Index(BMI): 21 Temperature(F): 97.6 Respiratory Rate 16 (breaths/min): Photos: [1:No Photos] [2:No Photos] [3:No Photos] Wound Location: [1:Right Calcaneus] [2:Left Foot - Lateral] [3:Left Malleolus - Lateral] Wounding Event: [1:Pressure Injury] [2:Pressure Injury] [3:Pressure Injury] Primary Etiology: [1:Pressure Ulcer] [2:Pressure Ulcer] [3:Pressure Ulcer] Comorbid History: [1:Cataracts, Glaucoma, Chronic Obstructive Pulmonary Disease (COPD), Hypertension, Osteoarthritis] [2:Cataracts, Glaucoma, Chronic Obstructive Pulmonary Disease (COPD), Hypertension, Osteoarthritis] [3:Cataracts, Glaucoma, Chronic  Obstructive Pulmonary Disease (COPD), Hypertension, Osteoarthritis] Date Acquired: [1:04/08/2016] [2:04/08/2016] [3:04/08/2016] Weeks of Treatment: [1:0] [2:0] [3:0] Wound Status: [1:Open] [2:Open] [3:Open] Measurements L x W x D 0.1x0.1x0.1 [2:0.7x0.7x0.1] [3:1x1x0.1] (cm) Area (cm) : [1:0.008] [2:0.385] [3:0.785] Volume (cm) : [1:0.001] [2:0.038] [3:0.079] Classification: [1:Category/Stage II] [2:Category/Stage II] [3:Category/Stage II] Exudate Amount: [1:Small] [2:Large] [3:Large] Exudate Type: [1:Serosanguineous] [2:Serosanguineous] [3:Serosanguineous] Exudate Color: [1:red, brown] [2:red, brown] [3:red, brown] Wound Margin: [1:Distinct, outline attached] [2:Distinct, outline attached] [3:Distinct, outline attached] Granulation Amount: [1:None Present (0%)] [2:None Present (0%)] [3:None Present (0%)]  Necrotic Amount: [1:Large (67-100%)] [2:Large (67-100%)] [3:Large (67-100%)] Necrotic Tissue: [1:Eschar] [2:Eschar] [3:Eschar] Exposed Structures: [1:Fascia: No Fat: No Tendon: No Muscle: No] [2:Fascia: No Fat: No Tendon: No Muscle: No] [3:N/A] Joint: No Joint: No Bone: No Bone: No Limited to Skin Limited to Skin Breakdown Breakdown Epithelialization: None None None Debridement: Debridement ([id number](11042- N/A  N/A 11047) Pre-procedure 10:53 N/A N/A Verification/Time Out Taken: Pain Control: Lidocaine 4% Topical N/A N/A Solution Tissue Debrided: Fibrin/Slough, Exudates, N/A N/A Subcutaneous Level: Skin/Subcutaneous N/A N/A Tissue Debridement Area (sq 0.01 N/A N/A cm): Instrument: Curette N/A N/A Bleeding: Minimum N/A N/A Hemostasis Achieved: Pressure N/A N/A Procedural Pain: 0 N/A N/A Post Procedural Pain: 0 N/A N/A Debridement Treatment Procedure was tolerated N/A N/A Response: well Post Debridement 0.6x0.3x0.1 N/A N/A Measurements L x W x D (cm) Post Debridement 0.014 N/A N/A Volume: (cm) Post Debridement Category/Stage II N/A N/A Stage: Periwound Skin Texture: No Abnormalities Noted No Abnormalities Noted No Abnormalities Noted Periwound Skin No Abnormalities Noted Moist: No No Abnormalities Noted Moisture: Periwound Skin Color: No Abnormalities Noted No Abnormalities Noted No Abnormalities Noted Temperature: No Abnormality No Abnormality No Abnormality Tenderness on Yes Yes Yes Palpation: Wound Preparation: Ulcer Cleansing: Ulcer Cleansing: Ulcer Cleansing: Rinsed/Irrigated with Rinsed/Irrigated with Rinsed/Irrigated with Saline Saline Saline Topical Anesthetic Topical Anesthetic Topical Anesthetic Applied: Other: lidocaine Applied: None Applied: None 4% Procedures Performed: Debridement N/A N/A Treatment Notes [name] DownsLAW, [name] M. ([id number]) Electronic Signature(s) Signed: 05/15/2016 4:54:36 PM By: Alejandro MullingPinkerton, Debra Entered By: Alejandro MullingPinkerton, Debra on 05/14/2016 12:42:43 Mcvay, [name] ForehandESTHER M. ([id number]) -------------------------------------------------------------------------------- Multi-Disciplinary Care Plan Details Patient Name: [name] DownsLAW, [name] M. Date of Service: 05/14/2016 9:45 AM Medical Record Patient Account Number: [id number] Number: Treating RN: Phillis Haggisinkerton, Debi Feb 05, 1930 (82 y.o. Other Clinician: Date of Birth/Sex: Female) Treating ROBSON, MICHAEL Primary  Care Physician: Mila MerryFISHER, DONALD Physician/Extender: G Referring Physician: Genia DelFISHER, DONALD Weeks in Treatment: 0 Active Inactive Abuse / Safety / Falls / Self Care Management Nursing Diagnoses: Potential for falls Goals: Patient will remain injury free Date Initiated: 05/14/2016 Goal Status: Active Interventions: Assess fall risk on admission and as needed Assess self care needs on admission and as needed Notes: Nutrition Nursing Diagnoses: Imbalanced nutrition Goals: Patient/caregiver agrees to and verbalizes understanding of need to use nutritional supplements and/or vitamins as prescribed Date Initiated: 05/14/2016 Goal Status: Active Interventions: Assess patient nutrition upon admission and as needed per policy Notes: Orientation to the Wound Care Program Nursing Diagnoses: Knowledge deficit related to the wound healing center program [name] DownsLAW, Haileigh M. ([id number]) Goals: Patient/caregiver will verbalize understanding of the Wound Healing Center Program Date Initiated: 05/14/2016 Goal Status: Active Interventions: Provide education on orientation to the wound center Notes: Wound/Skin Impairment Nursing Diagnoses: Impaired tissue integrity Goals: Ulcer/skin breakdown will have a volume reduction of 30% by week 4 Date Initiated: 05/14/2016 Goal Status: Active Ulcer/skin breakdown will have a volume reduction of 50% by week 8 Date Initiated: 05/14/2016 Goal Status: Active Ulcer/skin breakdown will have a volume reduction of 80% by week 12 Date Initiated: 05/14/2016 Goal Status: Active Interventions: Assess patient/caregiver ability to obtain necessary supplies Assess patient/caregiver ability to perform ulcer/skin care regimen upon admission and as needed Assess ulceration(s) every visit Notes: Electronic Signature(s) Signed: 05/15/2016 4:54:36 PM By: Alejandro MullingPinkerton, Debra Entered By: Alejandro MullingPinkerton, Debra on 05/14/2016 12:42:22 Harty, [name] ForehandESTHER M.  ([id number]) -------------------------------------------------------------------------------- Pain Assessment Details Patient Name: [name] DownsLAW, [name] M. Date of Service: 05/14/2016 9:45 AM Medical Record Patient Account Number: [id number] Number: Treating RN: Phillis Haggisinkerton, Debi Feb 05, 1930 (82 y.o. Other Clinician: Date of Birth/Sex: Female) Treating ROBSON,  MICHAEL Primary Care Physician: Mila MerryFISHER, DONALD Physician/Extender: G Referring Physician: Genia DelFISHER, DONALD Weeks in Treatment: 0 Active Problems Location of Pain Severity and Description of Pain Patient Has Paino No Site Locations With Dressing Change: No Pain Management and Medication Current Pain Management: Electronic Signature(s) Signed: 05/15/2016 4:54:36 PM By: Alejandro MullingPinkerton, Debra Entered By: Alejandro MullingPinkerton, Debra on 05/14/2016 10:10:53 Ahles, [name] ForehandESTHER M. ([id number]) -------------------------------------------------------------------------------- Patient/Caregiver Education Details Patient Name: [name] DownsLAW, [name] M. Date of Service: 05/14/2016 9:45 AM Medical Record Patient Account Number: [id number] Number: Treating RN: Phillis Haggisinkerton, Debi 03/22/1930 (82 y.o. Other Clinician: Date of Birth/Gender: Female) Treating ROBSON, MICHAEL Primary Care Physician: Mila MerryFISHER, DONALD Physician/Extender: G Referring Physician: Genia DelFISHER, DONALD Weeks in Treatment: 0 Education Assessment Education Provided To: Patient Education Topics Provided Welcome To The [facility]: Handouts: Welcome To The [facility] Methods: Explain/Verbal Responses: State content correctly Wound/Skin Impairment: Handouts: Other: change dressings as directed Methods: Demonstration, Explain/Verbal Responses: State content correctly Electronic Signature(s) Signed: 05/15/2016 4:54:36 PM By: Alejandro MullingPinkerton, Debra Entered By: Alejandro MullingPinkerton, Debra on 05/14/2016 12:44:39 Spaid, [name] ForehandESTHER M.  ([id number]) -------------------------------------------------------------------------------- Wound Assessment Details Patient Name: [name] DownsLAW, [name] M. Date of Service: 05/14/2016 9:45 AM Medical Record Patient Account Number: [id number] Number: Treating RN: Phillis Haggisinkerton, Debi 03/22/1930 (82 y.o. Other Clinician: Date of Birth/Sex: Female) Treating ROBSON, MICHAEL Primary Care Physician: Mila MerryFISHER, DONALD Physician/Extender: G Referring Physician: Mila MerryFISHER, DONALD Weeks in Treatment: 0 Wound Status Wound Number: 1 Primary Pressure Ulcer Etiology: Wound Location: Right Calcaneus Wound Open Wounding Event: Pressure Injury Status: Date Acquired: 04/08/2016 Comorbid Cataracts, Glaucoma, Chronic Weeks Of Treatment: 0 History: Obstructive Pulmonary Disease Clustered Wound: No (COPD), Hypertension, Osteoarthritis Photos Photo Uploaded By: Alejandro MullingPinkerton, Debra on 05/14/2016 16:47:39 Wound Measurements Length: (cm) 0.1 Width: (cm) 0.1 Depth: (cm) 0.1 Area: (cm) 0.008 Volume: (cm) 0.001 % Reduction in Area: % Reduction in Volume: Epithelialization: None Tunneling: No Undermining: No Wound Description Classification: Category/Stage II Wound Margin: Distinct, outline attached Exudate Amount: Small Exudate Type: Serosanguineous Exudate Color: red, brown Foul Odor After Cleansing: No Wound Bed Granulation Amount: None Present (0%) Exposed Structure Necrotic Amount: Large (67-100%) Fascia Exposed: No Necrotic Quality: Eschar Fat Layer Exposed: No Tendon Exposed: No Diaz, Malgorzata M. ([id number]) Muscle Exposed: No Joint Exposed: No Bone Exposed: No Limited to Skin Breakdown Periwound Skin Texture Texture Color No Abnormalities Noted: No No Abnormalities Noted: No Moisture Temperature / Pain No Abnormalities Noted: No Temperature: No Abnormality Tenderness on Palpation: Yes Wound Preparation Ulcer Cleansing: Rinsed/Irrigated with Saline Topical Anesthetic  Applied: Other: lidocaine 4%, Treatment Notes Wound #1 (Right Calcaneus) 1. Cleansed with: Clean wound with Normal Saline 2. Anesthetic Topical Lidocaine 4% cream to wound bed prior to debridement 4. Dressing Applied: Promogran 5. Secondary Dressing Applied Dry Gauze Kerlix/Conform 7. Secured with Tape Notes netting, heel cup Electronic Signature(s) Signed: 05/15/2016 4:54:36 PM By: Alejandro MullingPinkerton, Debra Entered By: Alejandro MullingPinkerton, Debra on 05/14/2016 10:41:33 Griggs, [name] ForehandESTHER M. ([id number]) -------------------------------------------------------------------------------- Wound Assessment Details Patient Name: [name] DownsLAW, [name] M. Date of Service: 05/14/2016 9:45 AM Medical Record Patient Account Number: [id number] Number: Treating RN: Phillis Haggisinkerton, Debi 03/22/1930 (82 y.o. Other Clinician: Date of Birth/Sex: Female) Treating ROBSON, MICHAEL Primary Care Physician: Mila MerryFISHER, DONALD Physician/Extender: G Referring Physician: Genia DelFISHER, DONALD Weeks in Treatment: 0 Wound Status Wound Number: 2 Primary Pressure Ulcer Etiology: Wound Location: Left Foot - Lateral Wound Open Wounding Event: Pressure Injury Status: Date Acquired: 04/08/2016 Comorbid Cataracts, Glaucoma, Chronic Weeks Of Treatment: 0 History: Obstructive Pulmonary Disease Clustered Wound: No (COPD), Hypertension, Osteoarthritis Wound Measurements Length: (cm) 0.7 Width: (cm) 0.7 Depth: (cm) 0.1 Area: (  cm) 0.385 Volume: (cm) 0.038 % Reduction in Area: % Reduction in Volume: Epithelialization: None Tunneling: No Undermining: No Wound Description Classification: Category/Stage II Wound Margin: Distinct, outline attached Exudate Amount: Large Exudate Type: Serosanguineous Exudate Color: red, brown Foul Odor After Cleansing: No Wound Bed Granulation Amount: None Present (0%) Exposed Structure Necrotic Amount: Large (67-100%) Fascia Exposed: No Necrotic Quality: Eschar Fat Layer Exposed: No Tendon Exposed:  No Muscle Exposed: No Joint Exposed: No Bone Exposed: No Limited to Skin Breakdown Periwound Skin Texture Texture Color No Abnormalities Noted: No No Abnormalities Noted: No Moisture Temperature / Pain [name] [name] M. ([id number]) No Abnormalities Noted: No Temperature: No Abnormality Moist: No Tenderness on Palpation: Yes Wound Preparation Ulcer Cleansing: Rinsed/Irrigated with Saline Topical Anesthetic Applied: None Treatment Notes Wound #2 (Left, Lateral Foot) 1. Cleansed with: Clean wound with Normal Saline 5. Secondary Dressing Applied Foam Kerlix/Conform 7. Secured with Tape Notes netting Electronic Signature(s) Signed: 05/15/2016 4:54:36 PM By: Alejandro Mulling Entered By: Alejandro Mulling on 05/14/2016 11:04:29 Mckeel, [name] [name] ([id number]) -------------------------------------------------------------------------------- Wound Assessment Details Patient Name: [name] [name]. Date of Service: 05/14/2016 9:45 AM Medical Record Patient Account Number: [id number] Number: Treating RN: Phillis Haggis [birth year] (82 y.o. Other Clinician: Date of Birth/Sex: Female) Treating ROBSON, MICHAEL Primary Care Physician: Mila Merry Physician/Extender: G Referring Physician: Genia Del in Treatment: 0 Wound Status Wound Number: 3 Primary Pressure Ulcer Etiology: Wound Location: Left Malleolus - Lateral Wound Open Wounding Event: Pressure Injury Status: Date Acquired: 04/08/2016 Comorbid Cataracts, Glaucoma, Chronic Weeks Of Treatment: 0 History: Obstructive Pulmonary Disease Clustered Wound: No (COPD), Hypertension, Osteoarthritis Wound Measurements Length: (cm) 1 Width: (cm) 1 Depth: (cm) 0.1 Area: (cm) 0.785 Volume: (cm) 0.079 % Reduction in Area: % Reduction in Volume: Epithelialization: None Tunneling: No Undermining: No Wound Description Classification: Category/Stage II Wound Margin: Distinct, outline attached Exudate Amount:  Large Exudate Type: Serosanguineous Exudate Color: red, brown Foul Odor After Cleansing: No Wound Bed Granulation Amount: None Present (0%) Necrotic Amount: Large (67-100%) Necrotic Quality: Eschar Periwound Skin Texture Texture Color No Abnormalities Noted: No No Abnormalities Noted: No Moisture Temperature / Pain No Abnormalities Noted: No Temperature: No Abnormality Tenderness on Palpation: Yes Wound Preparation Ulcer Cleansing: Rinsed/Irrigated with Saline Topical Anesthetic Applied: None Gehrig, Ahniya M. ([id number]) Treatment Notes Wound #3 (Left, Lateral Malleolus) 1. Cleansed with: Clean wound with Normal Saline 5. Secondary Dressing Applied Foam Kerlix/Conform 7. Secured with Tape Notes netting Electronic Signature(s) Signed: 05/15/2016 4:54:36 PM By: Alejandro Mulling Entered By: Alejandro Mulling on 05/14/2016 11:06:07 Vuolo, [name] [name] ([id number]) -------------------------------------------------------------------------------- Vitals Details Patient Name: [name] [name]. Date of Service: 05/14/2016 9:45 AM Medical Record Patient Account Number: [id number] Number: Treating RN: Phillis Haggis 1929/10/12 (82 y.o. Other Clinician: Date of Birth/Sex: Female) Treating ROBSON, MICHAEL Primary Care Physician: Mila Merry Physician/Extender: G Referring Physician: Genia Del in Treatment: 0 Vital Signs Time Taken: 10:11 Temperature (F): 97.6 Height (in): 62 Pulse (bpm): 70 Source: Stated Respiratory Rate (breaths/min): 16 Weight (lbs): 115 Blood Pressure (mmHg): 142/60 Source: Stated Reference Range: 80 - 120 mg / dl Body Mass Index (BMI): 21 Electronic Signature(s) Signed: 05/15/2016 4:54:36 PM By: Alejandro Mulling Entered By: Alejandro Mulling on 05/14/2016 10:13:02

## 2016-05-16 NOTE — Progress Notes (Signed)
[name] Female    DOB: 1930-01-09   82 y.o.   MRN: [id number] Visit Date: 05/16/2016  Today's Provider: Mila Merryonald Fisher, MD   Chief Complaint  Patient presents with  . Wound Check   Subjective:    HPI Patient comes in today for a wound check. Patient was last seen on 05/06/2016, and a wound culture was obtained. The wound culture was positive for enterococcus, and the patient's antibiotic was changed to Amoxicillin 500mg  (2) tablets BID X 10 days. She is now being followed at wound care clinic. States heels is still a little sore, but improving.     Allergies  Allergen Reactions  . Shellfish Allergy   . Codeine     headache  . Prednisone     Altered Mental Status  . Tetracycline      Current Outpatient Prescriptions:  .  albuterol (PROVENTIL HFA;VENTOLIN HFA) 108 (90 Base) MCG/ACT inhaler, Inhale 2 puffs into the lungs every 4 (four) hours as needed for wheezing or shortness of breath., Disp: 3 Inhaler, Rfl: 1 .  amoxicillin (AMOXIL) 500 MG capsule, Take 1 capsule (500 mg total) by mouth 2 (two) times daily., Disp: 20 capsule, Rfl: 0 .  arformoterol (BROVANA) 15 MCG/2ML NEBU, Take 2 mLs (15 mcg total) by nebulization 2 (two) times daily., Disp: 360 mL, Rfl: 3 .  aspirin EC 81 MG EC tablet, Take 1 tablet (81 mg total) by mouth daily., Disp: 30 tablet, Rfl: 0 .  cholecalciferol (VITAMIN D) 1000 UNITS tablet, Take 1,000 Units by mouth daily., Disp: , Rfl:  .  dorzolamide (TRUSOPT) 2 % ophthalmic solution, Place 1 drop into both eyes 2 (two) times daily. , Disp: , Rfl:  .  HYDROcodone-acetaminophen (NORCO/VICODIN) 5-325 MG tablet, Take 1 tablet by mouth every 6 (six) hours as needed for moderate pain., Disp: 30 tablet, Rfl: 0 .  latanoprost (XALATAN) 0.005 % ophthalmic solution, Place 1 drop into both eyes at bedtime., Disp: , Rfl:  .  Multiple Vitamins-Minerals (CENTRUM SILVER ADULT 50+ PO), Take 1 tablet by mouth daily., Disp: , Rfl:  .  polyethylene glycol  powder (GLYCOLAX/MIRALAX) powder, Dissolve 1 tablespoon in 4-8 ounces of juice or water and drink once daily., Disp: 255 g, Rfl: 0 .  cephALEXin (KEFLEX) 500 MG capsule, Take 1 capsule (500 mg total) by mouth 3 (three) times daily. For 10 days (Patient not taking: Reported on 05/16/2016), Disp: 30 capsule, Rfl: 0 .  metoprolol succinate (TOPROL-XL) 25 MG 24 hr tablet, Take 0.5 tablets (12.5 mg total) by mouth daily. (Patient not taking: Reported on 04/30/2016), Disp: 15 tablet, Rfl: 0  Review of Systems  Constitutional: Positive for activity change. Negative for appetite change, chills, diaphoresis, fatigue, fever and unexpected weight change.  Musculoskeletal: Positive for arthralgias and gait problem.  Skin: Positive for wound.    Social History  Substance Use Topics  . Smoking status: Former Smoker    Packs/day: 1.00    Years: 50.00    Types: Cigarettes    Quit date: 06/30/2000  . Smokeless tobacco: Never Used  . Alcohol use No   Objective:   BP (!) 144/62 (BP Location: Left Arm, Patient Position: Sitting, Cuff Size: Normal)   Pulse (!) 116   Temp 98.6 F (37 C)   Resp (!) 24   Wt 116 lb (52.6 kg)   SpO2 91% Comment: with 2L of O2.  BMI 21.22 kg/m   Physical Exam  Right heel with dry  scabbed area with no discharge or erythema.     Assessment & Plan:     1. Ulcer of right foot, unspecified ulcer stage (HCC) Healing well, is to finish rx for amoxicillin for enterococcus. Continue follow up wound clinic as scheduled. Follow up here prn.   2. Need for influenza vaccination  - Flu vaccine HIGH DOSE PF       Mila Merryonald Fisher, MD  [facility]

## 2016-05-21 ENCOUNTER — Encounter: Payer: Medicare Other | Admitting: Nurse Practitioner

## 2016-05-21 DIAGNOSIS — L89612 Pressure ulcer of right heel, stage 2: Secondary | ICD-10-CM | POA: Diagnosis not present

## 2016-05-27 NOTE — Progress Notes (Signed)
[name] [name], [name] [name] ([id number]) Visit Report for 05/21/2016 Arrival Information Details Patient Name: [name] [name], [name] [name]. Date of Service: 05/21/2016 1:30 PM Medical Record Number: [id number] Patient Account Number: [id number] Date of Birth/Sex: June 09, 1930 (82 y.o. Female) Treating RN: Phillis Haggis Primary Care Physician: Mila Merry Other Clinician: Referring Physician: Mila Merry Treating Physician/Extender: Kathreen Cosier in Treatment: 1 Visit Information History Since Last Visit All ordered tests and consults were completed: No Patient Arrived: Ambulatory Added or deleted any medications: No Arrival Time: 13:29 Any new allergies or adverse reactions: No Accompanied By: self Had a fall or experienced change in No Transfer Assistance: None activities of daily living that may affect Patient Identification Verified: Yes risk of falls: Secondary Verification Process Yes Signs or symptoms of abuse/neglect since last No Completed: visito Patient Requires Transmission-Based No Hospitalized since last visit: No Precautions: Pain Present Now: No Patient Has Alerts: No Electronic Signature(s) Signed: 06-09-2016 4:53:13 PM By: Alejandro Mulling Entered By: Alejandro Mulling on 05/21/2016 13:33:58 [name] [name] ([id number]) -------------------------------------------------------------------------------- Clinic Level of Care Assessment Details Patient Name: [name] [name]. Date of Service: 05/21/2016 1:30 PM Medical Record Number: [id number] Patient Account Number: [id number] Date of Birth/Sex: [birth year] (82 y.o. Female) Treating RN: Ashok Cordia, Debi Primary Care Physician: Mila Merry Other Clinician: Referring Physician: Mila Merry Treating Physician/Extender: Kathreen Cosier in Treatment: 1 Clinic Level of Care Assessment Items TOOL 4 Quantity Score X - Use when only an EandM is performed on FOLLOW-UP visit 1 0 ASSESSMENTS - Nursing Assessment / Reassessment X  - Reassessment of Co-morbidities (includes updates in patient status) 1 10 X - Reassessment of Adherence to Treatment Plan 1 5 ASSESSMENTS - Wound and Skin Assessment / Reassessment []  - Simple Wound Assessment / Reassessment - one wound 0 X - Complex Wound Assessment / Reassessment - multiple wounds 3 5 []  - Dermatologic / Skin Assessment (not related to wound area) 0 ASSESSMENTS - Focused Assessment []  - Circumferential Edema Measurements - multi extremities 0 []  - Nutritional Assessment / Counseling / Intervention 0 []  - Lower Extremity Assessment (monofilament, tuning fork, pulses) 0 []  - Peripheral Arterial Disease Assessment (using hand held doppler) 0 ASSESSMENTS - Ostomy and/or Continence Assessment and Care []  - Incontinence Assessment and Management 0 []  - Ostomy Care Assessment and Management (repouching, etc.) 0 PROCESS - Coordination of Care X - Simple Patient / Family Education for ongoing care 1 15 []  - Complex (extensive) Patient / Family Education for ongoing care 0 X - Staff obtains Chiropractor, Records, Test Results / Process Orders 1 10 []  - Staff telephones HHA, Nursing Homes / Clarify orders / etc 0 []  - Routine Transfer to another Facility (non-emergent condition) 0 [name], [name] ([id number]) []  - Routine Hospital Admission (non-emergent condition) 0 []  - New Admissions / Manufacturing engineer / Ordering NPWT, Apligraf, etc. 0 []  - Emergency Hospital Admission (emergent condition) 0 X - Simple Discharge Coordination 1 10 []  - Complex (extensive) Discharge Coordination 0 PROCESS - Special Needs []  - Pediatric / Minor Patient Management 0 []  - Isolation Patient Management 0 []  - Hearing / Language / Visual special needs 0 []  - Assessment of Community assistance (transportation, D/C planning, etc.) 0 []  - Additional assistance / Altered mentation 0 []  - Support Surface(s) Assessment (bed, cushion, seat, etc.) 0 INTERVENTIONS - Wound Cleansing / Measurement []  -  Simple Wound Cleansing - one wound 0 X - Complex Wound Cleansing - multiple wounds 3 5 X - Wound Imaging (photographs - any number of wounds) 1 5 []  -  Wound Tracing (instead of photographs) 0 []  - Simple Wound Measurement - one wound 0 []  - Complex Wound Measurement - multiple wounds 0 INTERVENTIONS - Wound Dressings []  - Small Wound Dressing one or multiple wounds 0 []  - Medium Wound Dressing one or multiple wounds 0 []  - Large Wound Dressing one or multiple wounds 0 []  - Application of Medications - topical 0 []  - Application of Medications - injection 0 INTERVENTIONS - Miscellaneous []  - External ear exam 0 [name], [name] ([id number]) []  - Specimen Collection (cultures, biopsies, blood, body fluids, etc.) 0 []  - Specimen(s) / Culture(s) sent or taken to Lab for analysis 0 []  - Patient Transfer (multiple staff / Michiel SitesHoyer Lift / Similar devices) 0 []  - Simple Staple / Suture removal (25 or less) 0 []  - Complex Staple / Suture removal (26 or more) 0 []  - Hypo / Hyperglycemic Management (close monitor of Blood Glucose) 0 []  - Ankle / Brachial Index (ABI) - do not check if billed separately 0 X - Vital Signs 1 5 Has the patient been seen at the hospital within the last three years: Yes Total Score: 90 Level Of Care: New/Established - Level 3 Electronic Signature(s) Signed: 2020/08/315 4:53:13 PM By: Alejandro MullingPinkerton, Debra Entered By: Alejandro MullingPinkerton, Debra on 05/21/2016 14:11:31 [name], Myriam ForehandESTHER M. ([id number]) -------------------------------------------------------------------------------- Encounter Discharge Information Details Patient Name: [name] DownsLAW, Acelyn M. Date of Service: 05/21/2016 1:30 PM Medical Record Number: [id number] Patient Account Number: [id number] Date of Birth/Sex: Feb 15, 1930 (82 y.o. Female) Treating RN: Phillis HaggisPinkerton, Debi Primary Care Physician: Mila MerryFISHER, DONALD Other Clinician: Referring Physician: Mila MerryFISHER, DONALD Treating Physician/Extender: Kathreen Cosieroulter, Leah Weeks in Treatment: 1 Encounter  Discharge Information Items Discharge Pain Level: 0 Discharge Condition: Stable Ambulatory Status: Ambulatory Discharge Destination: Home Transportation: Private Auto Accompanied By: self Schedule Follow-up Appointment: No Medication Reconciliation completed and provided to Patient/Care Yes Shawnia Vizcarrondo: Provided on Clinical Summary of Care: 05/21/2016 Form Type Recipient Paper Patient EL Electronic Signature(s) Signed: 05/21/2016 2:04:18 PM By: Gwenlyn PerkingMoore, Shelia Entered By: Gwenlyn PerkingMoore, Shelia on 05/21/2016 14:04:18 Schlitt, Myriam ForehandESTHER M. ([id number]) -------------------------------------------------------------------------------- Lower Extremity Assessment Details Patient Name: [name] DownsLAW, Neela M. Date of Service: 05/21/2016 1:30 PM Medical Record Number: [id number] Patient Account Number: [id number] Date of Birth/Sex: Feb 15, 1930 (82 y.o. Female) Treating RN: Phillis HaggisPinkerton, Debi Primary Care Physician: Mila MerryFISHER, DONALD Other Clinician: Referring Physician: Mila MerryFISHER, DONALD Treating Physician/Extender: Kathreen Cosieroulter, Leah Weeks in Treatment: 1 Vascular Assessment Pulses: Posterior Tibial Dorsalis Pedis Palpable: [Left:Yes] [Right:Yes] Extremity colors, hair growth, and conditions: Extremity Color: [Left:Mottled] [Right:Mottled] Temperature of Extremity: [Right:Warm] Capillary Refill: [Left:> 3 seconds] [Right:> 3 seconds] Toe Nail Assessment Left: Right: Thick: No No Discolored: No No Deformed: No No Improper Length and Hygiene: Yes Yes Electronic Signature(s) Signed: 2020/08/315 4:53:13 PM By: Alejandro MullingPinkerton, Debra Entered By: Alejandro MullingPinkerton, Debra on 05/21/2016 13:38:45 Himes, Myriam ForehandESTHER M. ([id number]) -------------------------------------------------------------------------------- Multi Wound Chart Details Patient Name: [name] DownsLAW, Jalila M. Date of Service: 05/21/2016 1:30 PM Medical Record Number: [id number] Patient Account Number: [id number] Date of Birth/Sex: Feb 15, 1930 (82 y.o. Female) Treating RN: Phillis HaggisPinkerton,  Debi Primary Care Physician: Mila MerryFISHER, DONALD Other Clinician: Referring Physician: Mila MerryFISHER, DONALD Treating Physician/Extender: Bonnell Publicoulter, Leah Weeks in Treatment: 1 Vital Signs Height(in): 62 Pulse(bpm): 87 Weight(lbs): 115 Blood Pressure 142/64 (mmHg): Body Mass Index(BMI): 21 Temperature(F): 97.9 Respiratory Rate 16 (breaths/min): Photos: [1:No Photos] [2:No Photos] [3:No Photos] Wound Location: [1:Right Calcaneus] [2:Left Foot - Lateral] [3:Left Malleolus - Lateral] Wounding Event: [1:Pressure Injury] [2:Pressure Injury] [3:Pressure Injury] Primary Etiology: [1:Pressure Ulcer] [2:Pressure Ulcer] [3:Pressure Ulcer] Comorbid History: [1:Cataracts, Glaucoma, Chronic Obstructive Pulmonary Disease (COPD), Hypertension, Osteoarthritis] [2:Cataracts, Glaucoma, Chronic Obstructive  Pulmonary Disease (COPD), Hypertension, Osteoarthritis] [3:Cataracts, Glaucoma, Chronic  Obstructive Pulmonary Disease (COPD), Hypertension, Osteoarthritis] Date Acquired: [1:04/08/2016] [2:04/08/2016] [3:04/08/2016] Weeks of Treatment: [1:1] [2:1] [3:1] Wound Status: [1:Open] [2:Open] [3:Open] Measurements L x W x D 0.1x0.1x0.1 [2:0x0x0] [3:0x0x0] (cm) Area (cm) : [1:0.008] [2:0] [3:0] Volume (cm) : [1:0.001] [2:0] [3:0] % Reduction in Area: [1:0.00%] [2:100.00%] [3:100.00%] % Reduction in Volume: 0.00% [2:100.00%] [3:100.00%] Classification: [1:Category/Stage II] [2:Category/Stage II] [3:Category/Stage II] Exudate Amount: [1:Small] [2:None Present] [3:None Present] Exudate Type: [1:Serosanguineous] [2:N/A] [3:N/A] Exudate Color: [1:red, brown] [2:N/A] [3:N/A] Wound Margin: [1:Distinct, outline attached] [2:Distinct, outline attached] [3:Distinct, outline attached] Granulation Amount: [1:None Present (0%)] [2:None Present (0%)] [3:None Present (0%)] Necrotic Amount: [1:Large (67-100%)] [2:None Present (0%)] [3:None Present (0%)] Necrotic Tissue: [1:Eschar] [2:N/A] [3:N/A] Exposed  Structures: [1:Fascia: No Fat: No Tendon: No Muscle: No] [2:Fascia: No Fat: No Tendon: No Muscle: No] [3:N/A] Joint: No Joint: No Bone: No Bone: No Limited to Skin Limited to Skin Breakdown Breakdown Epithelialization: None Large (67-100%) Large (67-100%) Periwound Skin Texture: No Abnormalities Noted No Abnormalities Noted No Abnormalities Noted Periwound Skin No Abnormalities Noted Moist: No No Abnormalities Noted Moisture: Periwound Skin Color: No Abnormalities Noted No Abnormalities Noted No Abnormalities Noted Temperature: No Abnormality No Abnormality No Abnormality Tenderness on Yes Yes Yes Palpation: Wound Preparation: Ulcer Cleansing: Ulcer Cleansing: Ulcer Cleansing: Rinsed/Irrigated with Rinsed/Irrigated with Rinsed/Irrigated with Saline Saline Saline Topical Anesthetic Topical Anesthetic Topical Anesthetic Applied: Other: lidocaine Applied: None Applied: None 4% Treatment Notes Electronic Signature(s) Signed: Feb 06, 202017 4:53:13 PM By: Alejandro Mulling Entered By: Alejandro Mulling on 05/21/2016 13:45:27 Spilde, Myriam [name] ([id number]) -------------------------------------------------------------------------------- Multi-Disciplinary Care Plan Details Patient Name: BELINDA, SCHLICHTING. Date of Service: 05/21/2016 1:30 PM Medical Record Number: [id number] Patient Account Number: [id number] Date of Birth/Sex: [birth year] (82 y.o. Female) Treating RN: Phillis Haggis Primary Care Physician: Mila Merry Other Clinician: Referring Physician: Mila Merry Treating Physician/Extender: Bonnell Public Weeks in Treatment: 1 Active Inactive Electronic Signature(s) Signed: Feb 06, 202017 4:53:13 PM By: Alejandro Mulling Entered By: Alejandro Mulling on 05/21/2016 14:07:56 Rueb, Myriam [name] ([id number]) -------------------------------------------------------------------------------- Pain Assessment Details Patient Name: [name] [name]. Date of Service: 05/21/2016 1:30 PM Medical Record  Number: [id number] Patient Account Number: [id number] Date of Birth/Sex: [birth year] (82 y.o. Female) Treating RN: Phillis Haggis Primary Care Physician: Mila Merry Other Clinician: Referring Physician: Mila Merry Treating Physician/Extender: Kathreen Cosier in Treatment: 1 Active Problems Location of Pain Severity and Description of Pain Patient Has Paino No Site Locations With Dressing Change: No Pain Management and Medication Current Pain Management: Electronic Signature(s) Signed: Feb 06, 202017 4:53:13 PM By: Alejandro Mulling Entered By: Alejandro Mulling on 05/21/2016 13:34:04 Pargas, Myriam [name] ([id number]) -------------------------------------------------------------------------------- Patient/Caregiver Education Details Patient Name: [name] [name]. Date of Service: 05/21/2016 1:30 PM Medical Record Number: [id number] Patient Account Number: [id number] Date of Birth/Gender: 1929-08-21 (82 y.o. Female) Treating RN: Phillis Haggis Primary Care Physician: Mila Merry Other Clinician: Referring Physician: Mila Merry Treating Physician/Extender: Kathreen Cosier in Treatment: 1 Education Assessment Education Provided To: Patient Education Topics Provided Wound/Skin Impairment: Handouts: Other: Please call our office if you have any questions or concerns. Methods: Explain/Verbal Responses: State content correctly Electronic Signature(s) Signed: Feb 06, 202017 4:53:13 PM By: Alejandro Mulling Entered By: Alejandro Mulling on 05/21/2016 13:58:25 Ricci, Myriam [name] ([id number]) -------------------------------------------------------------------------------- Wound Assessment Details Patient Name: [name] [name]. Date of Service: 05/21/2016 1:30 PM Medical Record Number: [id number] Patient Account Number: [id number] Date of Birth/Sex: 03-07-1930 (82 y.o. Female) Treating RN: Phillis Haggis Primary Care Physician: Mila Merry Other Clinician: Referring Physician:  Mila Merry Treating Physician/Extender:  Coulter, Leah Weeks in Treatment: 1 Wound Status Wound Number: 1 Primary Pressure Ulcer Etiology: Wound Location: Right Calcaneus Wound Open Wounding Event: Pressure Injury Status: Date Acquired: 04/08/2016 Comorbid Cataracts, Glaucoma, Chronic Weeks Of Treatment: 1 History: Obstructive Pulmonary Disease Clustered Wound: No (COPD), Hypertension, Osteoarthritis Photos Photo Uploaded By: Alejandro MullingPinkerton, Debra on 05/21/2016 14:10:50 Wound Measurements Length: (cm) 0 % Reductio Width: (cm) 0 % Reductio Depth: (cm) 0 Epithelial Area: (cm) 0 Tunneling Volume: (cm) 0 Undermini n in Area: 100% n in Volume: 100% ization: Large (67-100%) : No ng: No Wound Description Classification: Category/Stage II Wound Margin: Distinct, outline attached Exudate Amount: None Present Foul Odor After Cleansing: No Wound Bed Granulation Amount: None Present (0%) Exposed Structure Necrotic Amount: None Present (0%) Fascia Exposed: No Fat Layer Exposed: No Tendon Exposed: No Muscle Exposed: No Joint Exposed: No Bone Exposed: No Limited to Skin Breakdown Likes, Aldina M. ([id number]) Periwound Skin Texture Texture Color No Abnormalities Noted: No No Abnormalities Noted: No Moisture Temperature / Pain No Abnormalities Noted: No Temperature: No Abnormality Tenderness on Palpation: Yes Wound Preparation Ulcer Cleansing: Rinsed/Irrigated with Saline Electronic Signature(s) Signed: 07-27-2015 4:53:13 PM By: Alejandro MullingPinkerton, Debra Entered By: Alejandro MullingPinkerton, Debra on 05/21/2016 13:57:06 Pisarski, Myriam ForehandESTHER M. ([id number]) -------------------------------------------------------------------------------- Wound Assessment Details Patient Name: [name] DownsLAW, Aruna M. Date of Service: 05/21/2016 1:30 PM Medical Record Number: [id number] Patient Account Number: [id number] Date of Birth/Sex: 1930/02/24 (82 y.o. Female) Treating RN: Phillis HaggisPinkerton, Debi Primary Care Physician: Mila MerryFISHER,  DONALD Other Clinician: Referring Physician: Mila MerryFISHER, DONALD Treating Physician/Extender: Bonnell Publicoulter, Leah Weeks in Treatment: 1 Wound Status Wound Number: 2 Primary Pressure Ulcer Etiology: Wound Location: Left Foot - Lateral Wound Open Wounding Event: Pressure Injury Status: Date Acquired: 04/08/2016 Comorbid Cataracts, Glaucoma, Chronic Weeks Of Treatment: 1 History: Obstructive Pulmonary Disease Clustered Wound: No (COPD), Hypertension, Osteoarthritis Photos Photo Uploaded By: Alejandro MullingPinkerton, Debra on 05/21/2016 14:10:50 Wound Measurements Length: (cm) 0 % Reductio Width: (cm) 0 % Reductio Depth: (cm) 0 Epithelial Area: (cm) 0 Tunneling Volume: (cm) 0 Undermini n in Area: 100% n in Volume: 100% ization: Large (67-100%) : No ng: No Wound Description Classification: Category/Stage II Wound Margin: Distinct, outline attached Exudate Amount: None Present Foul Odor After Cleansing: No Wound Bed Granulation Amount: None Present (0%) Exposed Structure Necrotic Amount: None Present (0%) Fascia Exposed: No Fat Layer Exposed: No Tendon Exposed: No Muscle Exposed: No Joint Exposed: No Bone Exposed: No Limited to Skin Breakdown Debono, Claudell Judie PetitM. ([id number]) Periwound Skin Texture Texture Color No Abnormalities Noted: No No Abnormalities Noted: No Moisture Temperature / Pain No Abnormalities Noted: No Temperature: No Abnormality Moist: No Tenderness on Palpation: Yes Wound Preparation Ulcer Cleansing: Rinsed/Irrigated with Saline Topical Anesthetic Applied: None Electronic Signature(s) Signed: 07-27-2015 4:53:13 PM By: Alejandro MullingPinkerton, Debra Entered By: Alejandro MullingPinkerton, Debra on 05/21/2016 13:41:37 Tuckey, Myriam ForehandESTHER M. ([id number]) -------------------------------------------------------------------------------- Wound Assessment Details Patient Name: [name] DownsLAW, Easton M. Date of Service: 05/21/2016 1:30 PM Medical Record Number: [id number] Patient Account Number: [id number] Date of Birth/Sex:  1930/02/24 (82 y.o. Female) Treating RN: Phillis HaggisPinkerton, Debi Primary Care Physician: Mila MerryFISHER, DONALD Other Clinician: Referring Physician: Mila MerryFISHER, DONALD Treating Physician/Extender: Bonnell Publicoulter, Leah Weeks in Treatment: 1 Wound Status Wound Number: 3 Primary Pressure Ulcer Etiology: Wound Location: Left Malleolus - Lateral Wound Open Wounding Event: Pressure Injury Status: Date Acquired: 04/08/2016 Comorbid Cataracts, Glaucoma, Chronic Weeks Of Treatment: 1 History: Obstructive Pulmonary Disease Clustered Wound: No (COPD), Hypertension, Osteoarthritis Photos Photo Uploaded By: Alejandro MullingPinkerton, Debra on 05/21/2016 14:11:04 Wound Measurements Length: (cm) 0 % Reducti Width: (cm) 0 % Reducti Depth: (cm) 0 Epithelia Area: (cm)  0 Tunnelin Volume: (cm) 0 Undermin on in Area: 100% on in Volume: 100% lization: Large (67-100%) g: No ing: No Wound Description Classification: Category/Stage II Wound Margin: Distinct, outline attached Exudate Amount: None Present Foul Odor After Cleansing: No Wound Bed Granulation Amount: None Present (0%) Necrotic Amount: None Present (0%) Periwound Skin Texture Texture Color No Abnormalities Noted: No No Abnormalities Noted: No Moisture Temperature / Pain No Abnormalities Noted: No Temperature: No Abnormality Pawloski, Lyla M. ([id number]) Tenderness on Palpation: Yes Wound Preparation Ulcer Cleansing: Rinsed/Irrigated with Saline Topical Anesthetic Applied: None Electronic Signature(s) Signed: 06-29-2016 4:53:13 PM By: Alejandro Mulling Entered By: Alejandro Mulling on 05/21/2016 13:41:06 Mauch, Myriam [name] ([id number]) -------------------------------------------------------------------------------- Vitals Details Patient Name: [name] [name]. Date of Service: 05/21/2016 1:30 PM Medical Record Number: [id number] Patient Account Number: [id number] Date of Birth/Sex: Feb 10, 1930 (82 y.o. Female) Treating RN: Ashok Cordia, Debi Primary Care Physician: Mila Merry Other Clinician: Referring Physician: Mila Merry Treating Physician/Extender: Kathreen Cosier in Treatment: 1 Vital Signs Time Taken: 13:38 Temperature (F): 97.9 Height (in): 62 Pulse (bpm): 87 Weight (lbs): 115 Respiratory Rate (breaths/min): 16 Body Mass Index (BMI): 21 Blood Pressure (mmHg): 142/64 Reference Range: 80 - 120 mg / dl Electronic Signature(s) Signed: 06-29-2016 4:53:13 PM By: Alejandro Mulling Entered By: Alejandro Mulling on 05/21/2016 13:38:40

## 2016-05-27 NOTE — Progress Notes (Signed)
[name] [name], [name] M. ([id number]) Visit Report for 05/21/2016 Chief Complaint Document Details Patient Name: [name] [name], [name] M. 05/21/2016 1:30 Date of Service: PM Medical Record [id number] Number: Patient Account Number: [id number] 06/21/1930 (82 y.o. Treating RN: [name] [name], [name] Date of Birth/Sex: Female) Other Clinician: Primary Care Physician: Mila MerryFISHER, [name] Treating [name] [name] Referring Physician: Mila MerryFISHER, [name] Physician/Extender: [name] AdeWeeks in Treatment: 1 Information Obtained from: Patient Chief Complaint [name] [name] returns today for follow-up on her right heel ulcer Electronic Signature(s) Signed: 05/21/2016 2:01:55 PM By: [name] Lashoulter, NP, [name] [name] Entered By: [name] Lashoulter, NP, [name] [name] on 05/21/2016 14:01:54 [name] [name] ForehandESTHER M. ([id number]) -------------------------------------------------------------------------------- HPI Details Patient Name: [name] [name], [name] M. 05/21/2016 1:30 Date of Service: PM Medical Record [id number] Number: Patient Account Number: [id number] 06/21/1930 (82 y.o. Treating RN: [name] [name], [name] Date of Birth/Sex: Female) Other Clinician: Primary Care Physician: Mila MerryFISHER, [name] Treating Desmund Elman Referring Physician: Mila MerryFISHER, [name] Physician/Extender: [name] AdeWeeks in Treatment: 1 History of Present Illness HPI Description: 05/14/16; this is a nondiabetic woman no known history of PAD. She tells me she was hospitalized then October after falling while she got up to go to the bathroom at night. She states that her legs would not work and that she was on the floor for an undetermined amount of hours. Eventually she was able to call someone and she was admitted to hospital from 10/6 through 10/10 she had rhabdomyolysis with a CK over 1000. Her troponin was also elevated and she was evaluated by cardiology but as far as I can tell was not felt to have a cardiac issue she had an echocardiogram and a nuclear medicine stress test which were normal. She apparently had a large hematoma on the  lateral aspect of her left leg, a wound on her right heel and some superficial areas on the left foot and left lateral malleolus. She has no known wound history. ABIs in this clinic were 0.93 on the right and 0.87 on the left 05-21-16 Ms. [name] [name] returns today for follow-up on her right heel ulceration. She admits to offloading with pad. She denies any pain or discomfort to this area is voicing no questions or concerns. Electronic Signature(s) Signed: 05/21/2016 2:03:45 PM By: [name] Lashoulter, NP, [name] [name] Entered By: [name] Lashoulter, NP, Erion Weightman on 05/21/2016 14:03:45 [name] [name] ForehandESTHER M. ([id number]) -------------------------------------------------------------------------------- Physical Exam Details Patient Name: [name] [name], [name] M. 05/21/2016 1:30 Date of Service: PM Medical Record [id number] Number: Patient Account Number: [id number] 06/21/1930 (82 y.o. Treating RN: [name] [name], [name] Date of Birth/Sex: Female) Other Clinician: Primary Care Physician: Mila MerryFISHER, [name] Treating Carys Malina Referring Physician: Mila MerryFISHER, [name] Physician/Extender: Weeks in Treatment: 1 Constitutional BP within normal limits. afebrile. well nourished; well developed; appears stated age;Marland Kitchen. Respiratory non-labored respiratory effort, oxygen and use. Integumentary (Hair, Skin) right heel ulceration completely epithelialized after gentle removal of crust. Psychiatric oriented to time, place, person and situation. calm, pleasant, conversive. Electronic Signature(s) Signed: 05/21/2016 2:06:11 PM By: [name] Lashoulter, NP, [name] [name] Entered By: [name] Lashoulter, NP, Choya Tornow on 05/21/2016 14:06:11 [name], [name] ([id number]) -------------------------------------------------------------------------------- Physician Orders Details Patient Name: [name] [name], [name] M. 05/21/2016 1:30 Date of Service: PM Medical Record [id number] Number: Patient Account Number: [id number] 06/21/1930 (82 y.o. Treating RN: [name] [name], [name] Date of Birth/Sex: Female) Other Clinician: Primary Care  Physician: Mila MerryFISHER, [name] Treating Arianni Gallego Referring Physician: Mila MerryFISHER, [name] Physician/Extender: [name] AdeWeeks in Treatment: 1 Verbal / Phone Orders: Yes Clinician: Ashok CordiaPinkerton, [name] Read Back and Verified: Yes Diagnosis [facility]oding [facility]charge [facility] Center - Please call our office if you have any questions or concerns. Electronic Signature(s) Signed: May 05, 202017 4:53:13 PM By: Ashok CordiaPinkerton,  Stanton KidneyDebra Signed: 05/27/2016 9:26:16 AM By: [name] Lashoulter, NP, Zahriyah Joo Entered By: Alejandro MullingPinkerton, [name] [name] on 05/21/2016 13:57:50 [name], [name] ([id number]) -------------------------------------------------------------------------------- Problem List Details Patient Name: [name] [name], [name] M. 05/21/2016 1:30 Date of Service: PM Medical Record [id number] Number: Patient Account Number: [id number] 10-Aug-1929 (82 y.o. Treating RN: [name] [name], [name] Date of Birth/Sex: Female) Other Clinician: Primary Care Physician: Mila MerryFISHER, [name] Treating Robynn Marcel Referring Physician: Mila MerryFISHER, [name] Physician/Extender: [name] AdeWeeks in Treatment: 1 Active Problems ICD-10 Encounter Code Description Active Date Diagnosis L89.612 Pressure ulcer of right heel, stage 2 05/14/2016 Yes Inactive Problems Resolved Problems Electronic Signature(s) Signed: 05/21/2016 2:01:30 PM By: [name] Lashoulter, NP, Malayshia All Entered By: [name] Lashoulter, NP, Rachael Zapanta on 05/21/2016 14:01:30 Peppers, [name] ([id number]) -------------------------------------------------------------------------------- Progress Note Details Patient Name: [name] [name], [name] M. 05/21/2016 1:30 Date of Service: PM Medical Record [id number] Number: Patient Account Number: [id number] 10-Aug-1929 (82 y.o. Treating RN: [name] [name], [name] Date of Birth/Sex: Female) Other Clinician: Primary Care Physician: Mila MerryFISHER, [name] Treating Aiman Sonn Referring Physician: Mila MerryFISHER, [name] Physician/Extender: [name] AdeWeeks in Treatment: 1 Subjective Chief Complaint Information obtained from  Patient [name] [name] returns today for follow-up on her right heel ulcer History of Present Illness (HPI) 05/14/16; this is a nondiabetic woman no known history of PAD. She tells me she was hospitalized then October after falling while she got up to go to the bathroom at night. She states that her legs would not work and that she was on the floor for an undetermined amount of hours. Eventually she was able to call someone and she was admitted to hospital from 10/6 through 10/10 she had rhabdomyolysis with a CK over 1000. Her troponin was also elevated and she was evaluated by cardiology but as far as I can tell was not felt to have a cardiac issue she had an echocardiogram and a nuclear medicine stress test which were normal. She apparently had a large hematoma on the lateral aspect of her left leg, a wound on her right heel and some superficial areas on the left foot and left lateral malleolus. She has no known wound history. ABIs in this clinic were 0.93 on the right and 0.87 on the left 05-21-16 Ms. Sica returns today for follow-up on her right heel ulceration. She admits to offloading with pad. She denies any pain or discomfort to this area is voicing no questions or concerns. Objective Constitutional BP within normal limits. afebrile. well nourished; well developed; appears stated age;Marland Kitchen. Vitals Time Taken: 1:38 PM, Height: 62 in, Weight: 115 lbs, BMI: 21, Temperature: 97.9 F, Pulse: 87 bpm, Respiratory Rate: 16 breaths/min, Blood Pressure: 142/64 mmHg. Respiratory non-labored respiratory effort, oxygen and use. Psychiatric [place], [name] ForehandSTHER M. ([id number]) oriented to time, place, person and situation. calm, pleasant, conversive. Integumentary (Hair, Skin) right heel ulceration completely epithelialized after gentle removal of crust. Wound #1 status is Open. Original cause of wound was Pressure Injury. The wound is located on the Right Calcaneus. The wound measures 0cm length x 0cm width x 0cm  depth; 0cm^2 area and 0cm^3 volume. The wound is limited to skin breakdown. There is no tunneling or undermining noted. There is a none present amount of drainage noted. The wound margin is distinct with the outline attached to the wound base. There is no granulation within the wound bed. There is no necrotic tissue within the wound bed. Periwound temperature was noted as No Abnormality. The periwound has tenderness on palpation. Wound #2 status is Open. Original cause of wound was Pressure Injury. The wound is located on the Left,Lateral Foot. The wound measures 0cm length  x 0cm width x 0cm depth; 0cm^2 area and 0cm^3 volume. The wound is limited to skin breakdown. There is no tunneling or undermining noted. There is a none present amount of drainage noted. The wound margin is distinct with the outline attached to the wound base. There is no granulation within the wound bed. There is no necrotic tissue within the wound bed. The periwound skin appearance did not exhibit: Moist. Periwound temperature was noted as No Abnormality. The periwound has tenderness on palpation. Wound #3 status is Open. Original cause of wound was Pressure Injury. The wound is located on the Left,Lateral Malleolus. The wound measures 0cm length x 0cm width x 0cm depth; 0cm^2 area and 0cm^3 volume. There is no tunneling or undermining noted. There is a none present amount of drainage noted. The wound margin is distinct with the outline attached to the wound base. There is no granulation within the wound bed. There is no necrotic tissue within the wound bed. Periwound temperature was noted as No Abnormality. The periwound has tenderness on palpation. Assessment Active Problems ICD-10 L89.612 - Pressure ulcer of right heel, stage 2 Plan Discharge From [facility] Services: Discharge from Wound Care Center - Please call our office if you have any questions or concerns. [name] [name], [name] [name] ([id number]) Follow-Up  Appointments: Medication Reconciliation completed and provided to Patient/Care Provider. A Patient Clinical Summary of Care was provided to EL 1. Discharge from wound care center 2. Contact the wound care center for any future wound care issues Electronic Signature(s) Signed: 05/21/2016 2:06:54 PM By: [name] Lash, NP, [place] Lasseter Entered By: [name] Lash, NP, Argelia Formisano on 05/21/2016 14:06:53 Peckham, [name] ([id number]) -------------------------------------------------------------------------------- SuperBill Details Patient Name: [name] Downs. Date of Service: 05/21/2016 Medical Record Number: [id number] Patient Account Number: [id number] Date of Birth/Sex: 1930/03/12 (82 y.o. Female) Treating RN: [name] Haggis Primary Care Physician: Mila Merry Other Clinician: Referring Physician: Mila Merry Treating Physician/Extender: Bonnell Public Weeks in Treatment: 1 Diagnosis Coding ICD-10 Codes Code Description [id number] Pressure ulcer of right heel, stage 2 Facility Procedures CPT4 Code: [id number] Description: 99213 - WOUND CARE VISIT-LEV 3 EST PT Modifier: Quantity: 1 Physician Procedures CPT4 Code: [id number] Description: 64403 - WC PHYS LEVEL 2 - EST PT ICD-10 Description Diagnosis L89.612 Pressure ulcer of right heel, stage 2 Modifier: Quantity: 1 Notes Ms. Mcintire was educated and encouraged to keep the right heel protected from pressure for the next 1-2 weeks and then at that time she may resume normal activities. Electronic Signature(s) Signed: 01-05-202017 4:53:13 PM By: Alejandro Mulling Signed: 05/27/2016 9:26:16 AM By: [name] Lash, NP, Shruthi Northrup Previous Signature: 05/21/2016 2:07:54 PM Version By: [name] Lash, NP, Saddie Sandeen Entered By: Alejandro Mulling on 05/21/2016 14:11:49

## 2016-05-27 NOTE — Progress Notes (Signed)
[name] [name], Jacara M. ([id number]) Visit Report for 05/14/2016 Chief Complaint Document Details Patient Name: [name] [name], [name] M. Date of Service: 05/14/2016 9:45 AM Medical Record Patient Account Number: [id number] Number: Treating RN: Phillis Haggisinkerton, Debi 06-30-1930 (82 y.o. Other Clinician: Date of Birth/Sex: Female) Treating ROBSON, MICHAEL Primary Care Physician/Extender: Uvaldo RisingG FISHER, DONALD Physician: Referring Physician: Genia DelFISHER, DONALD Weeks in Treatment: 0 Information Obtained from: Patient Chief Complaint 05/14/16; patient arrives for review of wound on the right plantar heel and also areas over the left midfoot and left lateral malleolus which are not open Electronic Signature(s) Signed: 05/14/2016 4:57:31 PM By: Baltazar Najjarobson, Michael MD Entered By: Baltazar Najjarobson, Michael on 05/14/2016 12:27:12 Dipalma, Myriam ForehandESTHER M. ([id number]) -------------------------------------------------------------------------------- Debridement Details Patient Name: [name] [name], [name] M. Date of Service: 05/14/2016 9:45 AM Medical Record Patient Account Number: [id number] Number: Treating RN: Phillis Haggisinkerton, Debi 06-30-1930 (82 y.o. Other Clinician: Date of Birth/Sex: Female) Treating ROBSON, MICHAEL Primary Care Physician/Extender: Uvaldo RisingG FISHER, DONALD Physician: Referring Physician: Genia DelFISHER, DONALD Weeks in Treatment: 0 Debridement Performed for Wound #1 Right Calcaneus Assessment: Performed By: Physician Maxwell CaulOBSON, MICHAEL G, MD Debridement: Debridement Pre-procedure Yes - 10:53 Verification/Time Out Taken: Start Time: 10:54 Pain Control: Lidocaine 4% Topical Solution Level: Skin/Subcutaneous Tissue Total Area Debrided (L x 0.1 (cm) x 0.1 (cm) = 0.01 (cm) W): Tissue and other Viable, Non-Viable, Exudate, Fibrin/Slough, Subcutaneous material debrided: Instrument: Curette Bleeding: Minimum Hemostasis Achieved: Pressure End Time: 10:56 Procedural Pain: 0 Post Procedural Pain: 0 Response to Treatment: Procedure was  tolerated well Post Debridement Measurements of Total Wound Length: (cm) 0.6 Stage: Category/Stage II Width: (cm) 0.3 Depth: (cm) 0.1 Volume: (cm) 0.014 Character of Wound/Ulcer Post Requires Further Debridement: Debridement Severity of Tissue Post Fat layer exposed Debridement: Post Procedure Diagnosis Same as Pre-procedure Electronic Signature(s) [name] [name], Aryka M. ([id number]) Signed: 05/14/2016 4:57:31 PM By: Baltazar Najjarobson, Michael MD Signed: 05/15/2016 4:54:36 PM By: Alejandro MullingPinkerton, Debra Entered By: Baltazar Najjarobson, Michael on 05/14/2016 12:26:37 Theys, Myriam ForehandESTHER M. ([id number]) -------------------------------------------------------------------------------- HPI Details Patient Name: [name] [name], [name] M. Date of Service: 05/14/2016 9:45 AM Medical Record Patient Account Number: [id number] Number: Treating RN: Phillis Haggisinkerton, Debi 06-30-1930 (82 y.o. Other Clinician: Date of Birth/Sex: Female) Treating ROBSON, MICHAEL Primary Care Physician/Extender: Uvaldo RisingG FISHER, DONALD Physician: Referring Physician: Genia DelFISHER, DONALD Weeks in Treatment: 0 History of Present Illness HPI Description: 05/14/16; this is a nondiabetic woman no known history of PAD. She tells me she was hospitalized then October after falling while she got up to go to the bathroom at night. She states that her legs would not work and that she was on the floor for an undetermined amount of hours. Eventually she was able to call someone and she was admitted to hospital from 10/6 through 10/10 she had rhabdomyolysis with a CK over 1000. Her troponin was also elevated and she was evaluated by cardiology but as far as I can tell was not felt to have a cardiac issue she had an echocardiogram and a nuclear medicine stress test which were normal. She apparently had a large hematoma on the lateral aspect of her left leg, a wound on her right heel and some superficial areas on the left foot and left lateral malleolus. She has no known wound history.  ABIs in this clinic were 0.93 on the right and 0.87 on the left Electronic Signature(s) Signed: 05/14/2016 4:57:31 PM By: Baltazar Najjarobson, Michael MD Entered By: Baltazar Najjarobson, Michael on 05/14/2016 12:30:57 Boening, Myriam ForehandESTHER M. ([id number]) -------------------------------------------------------------------------------- Physical Exam Details Patient Name: [name] [name], [name] M. Date of Service: 05/14/2016 9:45 AM Medical Record Patient Account Number: [id number]  [id number] Number: Treating RN: Phillis Haggis 17-Oct-1929 (82 y.o. Other Clinician: Date of Birth/Sex: Female) Treating ROBSON, MICHAEL Primary Care Physician/Extender: Uvaldo Rising, DONALD Physician: Referring Physician: Genia Del in Treatment: 0 Constitutional Sitting or standing Blood Pressure is within target range for patient.. Pulse regular and within target range for patient.Marland Kitchen Respirations regular, non-labored and within target range.. Temperature is normal and within the target range for the patient.. Patient is on chronic oxygen but appears to be in no distress. Respiratory Respiratory effort is easy and symmetric bilaterally. Rate is normal at rest and on room air.. Bilateral breath sounds are clear and equal in all lobes with no wheezes, rales or rhonchi.. Cardiovascular Heart rhythm and rate regular, without murmur or gallop. JVP is not elevated. Femoral arteries without bruits and pulses strong.. Pedal pulses palpable and strong bilaterally.. There is no edema. Lymphatic None palpable in the popliteal or inguinal area. Psychiatric No evidence of depression, anxiety, or agitation. Calm, cooperative, and communicative. Appropriate interactions and affect.. Notes Wound exam; major area is at the edge of the plantar aspect of her calcaneus covered with eschar. This was debrided. Small superficial areas present. On the lateral aspect of her left foot over the cuboid is a dime-shaped area which I think at one point was some form of wound  however it is epithelialized also over the left lateral malleolus. I don't sense that any of these are that ominous but I think they should be protected Electronic Signature(s) Signed: 05/14/2016 4:57:31 PM By: Baltazar Najjar MD Entered By: Baltazar Najjar on 05/14/2016 12:37:16 [place]outenburg, [name] ([id number]) -------------------------------------------------------------------------------- Physician Orders Details Patient Name: [name] [name], ESTELLE. Date of Service: 05/14/2016 9:45 AM Medical Record Patient Account Number: [id number] Number: Treating RN: Phillis Haggis 20-May-1930 (82 y.o. Other Clinician: Date of Birth/Sex: Female) Treating ROBSON, MICHAEL Primary Care Physician/Extender: Uvaldo Rising, DONALD Physician: Referring Physician: Genia Del in Treatment: 0 Verbal / Phone Orders: Yes Clinician: Pinkerton, Debi Read Back and Verified: Yes Diagnosis Coding Wound Cleansing Wound #1 Right Calcaneus o Clean wound with Normal Saline. Anesthetic Wound #1 Right Calcaneus o Topical Lidocaine 4% cream applied to wound bed prior to debridement Primary Wound Dressing Wound #1 Right Calcaneus o Promogran - moisten with saline Secondary Dressing Wound #1 Right Calcaneus o Dry Gauze o Conform/Kerlix - tape, netting o Foam - heel cup Dressing Change Frequency Wound #1 Right Calcaneus o Change dressing every other day. Follow-up Appointments Wound #1 Right Calcaneus o Return Appointment in 1 week. Edema Control Wound #1 Right Calcaneus o Elevate legs to the level of the heart and pump ankles as often as possible Off-Loading Wound #1 Right Calcaneus [name], [name] M. ([id number]) o Other: - off loading darco shoe Additional Orders / Instructions Wound #1 Right Calcaneus o Increase protein intake. Electronic Signature(s) Signed: 05/14/2016 4:57:31 PM By: Baltazar Najjar MD Signed: 05/15/2016 4:54:36 PM By: Alejandro Mulling Entered By:  Alejandro Mulling on 05/14/2016 12:45:19 Yadav, [name] ([id number]) -------------------------------------------------------------------------------- Problem List Details Patient Name: [name] [name], REVARD. Date of Service: 05/14/2016 9:45 AM Medical Record Patient Account Number: [id number] Number: Treating RN: Phillis Haggis 1929-07-28 (82 y.o. Other Clinician: Date of Birth/Sex: Female) Treating ROBSON, MICHAEL Primary Care Physician/Extender: Uvaldo Rising, DONALD Physician: Referring Physician: Genia Del in Treatment: 0 Active Problems ICD-10 Encounter Code Description Active Date Diagnosis L89.612 Pressure ulcer of right heel, stage 2 05/14/2016 Yes Inactive Problems Resolved Problems Electronic Signature(s) Signed: 05/14/2016 4:57:31 PM By: Baltazar Najjar MD Entered By: Baltazar Najjar on 05/14/2016  12:39:29 CHRISETTE, MAN ([id number]) -------------------------------------------------------------------------------- Progress Note Details Patient Name: [name] [name], [name] NEUENFELDT. Date of Service: 05/14/2016 9:45 AM Medical Record Patient Account Number: [id number] Number: Treating RN: Phillis Haggis [birth year] (82 y.o. Other Clinician: Date of Birth/Sex: Female) Treating ROBSON, MICHAEL Primary Care Physician/Extender: Uvaldo Rising, DONALD Physician: Referring Physician: Genia Del in Treatment: 0 Subjective Chief Complaint Information obtained from Patient 05/14/16; patient arrives for review of wound on the right plantar heel and also areas over the left midfoot and left lateral malleolus which are not open History of Present Illness (HPI) 05/14/16; this is a nondiabetic woman no known history of PAD. She tells me she was hospitalized then October after falling while she got up to go to the bathroom at night. She states that her legs would not work and that she was on the floor for an undetermined amount of hours. Eventually she was able to  call someone and she was admitted to hospital from 10/6 through 10/10 she had rhabdomyolysis with a CK over 1000. Her troponin was also elevated and she was evaluated by cardiology but as far as I can tell was not felt to have a cardiac issue she had an echocardiogram and a nuclear medicine stress test which were normal. She apparently had a large hematoma on the lateral aspect of her left leg, a wound on her right heel and some superficial areas on the left foot and left lateral malleolus. She has no known wound history. ABIs in this clinic were 0.93 on the right and 0.87 on the left Wound History Patient presents with 1 open wound that has been present for approximately 04/08/16. Patient has been treating wound in the following manner: ABT ointment. Laboratory tests have not been performed in the last month. Patient reportedly has not tested positive for an antibiotic resistant organism. Patient reportedly has not tested positive for osteomyelitis. Patient reportedly has not had testing performed to evaluate circulation in the legs. Patient experiences the following problems associated with their wounds: swelling. Patient History Information obtained from Patient. Allergies shellfish derived, sulfur, codeine, prednisone, tetracycline Family History Cancer - Siblings, Stroke - Father, No family history of Diabetes, Heart Disease, Hereditary Spherocytosis, Hypertension, Kidney Disease, Lung Disease, Seizures, Thyroid Problems, Tuberculosis. [name] [name], [name] [name] ([id number]) Social History Former smoker - quit 2002, Marital Status - Widowed, Alcohol Use - Never, Drug Use - No History, Caffeine Use - Daily. Medical History Eyes Patient has history of Cataracts - surgery, Glaucoma Respiratory Patient has history of Chronic Obstructive Pulmonary Disease (COPD) Cardiovascular Patient has history of Hypertension Musculoskeletal Patient has history of Osteoarthritis Review of Systems  (ROS) Constitutional Symptoms (General Health) The patient has no complaints or symptoms. Eyes Complains or has symptoms of Glasses / Contacts. Ear/Nose/Mouth/Throat The patient has no complaints or symptoms. Hematologic/Lymphatic The patient has no complaints or symptoms. Respiratory Complains or has symptoms of Shortness of Breath - on exertion. Gastrointestinal The patient has no complaints or symptoms. Endocrine The patient has no complaints or symptoms. Genitourinary The patient has no complaints or symptoms. Immunological The patient has no complaints or symptoms. Integumentary (Skin) Complains or has symptoms of Wounds. Neurologic The patient has no complaints or symptoms. Oncologic The patient has no complaints or symptoms. Psychiatric The patient has no complaints or symptoms. Objective Talley, [name]. ([id number]) Constitutional Sitting or standing Blood Pressure is within target range for patient.. Pulse regular and within target range for patient.Marland Kitchen Respirations regular, non-labored and within target range.. Temperature is normal and within  the target range for the patient.. Patient is on chronic oxygen but appears to be in no distress. Vitals Time Taken: 10:11 AM, Height: 62 in, Source: Stated, Weight: 115 lbs, Source: Stated, BMI: 21, Temperature: 97.6 F, Pulse: 70 bpm, Respiratory Rate: 16 breaths/min, Blood Pressure: 142/60 mmHg. Respiratory Respiratory effort is easy and symmetric bilaterally. Rate is normal at rest and on room air.. Bilateral breath sounds are clear and equal in all lobes with no wheezes, rales or rhonchi.. Cardiovascular Heart rhythm and rate regular, without murmur or gallop. JVP is not elevated. Femoral arteries without bruits and pulses strong.. Pedal pulses palpable and strong bilaterally.. There is no edema. Lymphatic None palpable in the popliteal or inguinal area. Psychiatric No evidence of depression, anxiety, or agitation. Calm,  cooperative, and communicative. Appropriate interactions and affect.. General Notes: Wound exam; major area is at the edge of the plantar aspect of her calcaneus covered with eschar. This was debrided. Small superficial areas present. On the lateral aspect of her left foot over the cuboid is a dime-shaped area which I think at one point was some form of wound however it is epithelialized also over the left lateral malleolus. I don't sense that any of these are that ominous but I think they should be protected Integumentary (Hair, Skin) Wound #1 status is Open. Original cause of wound was Pressure Injury. The wound is located on the Right Calcaneus. The wound measures 0.1cm length x 0.1cm width x 0.1cm depth; 0.008cm^2 area and 0.001cm^3 volume. The wound is limited to skin breakdown. There is no tunneling or undermining noted. There is a small amount of serosanguineous drainage noted. The wound margin is distinct with the outline attached to the wound base. There is no granulation within the wound bed. There is a large (67-100%) amount of necrotic tissue within the wound bed including Eschar. Periwound temperature was noted as No Abnormality. The periwound has tenderness on palpation. Wound #2 status is Open. Original cause of wound was Pressure Injury. The wound is located on the Left,Lateral Foot. The wound measures 0.7cm length x 0.7cm width x 0.1cm depth; 0.385cm^2 area and 0.038cm^3 volume. The wound is limited to skin breakdown. There is no tunneling or undermining noted. There is a large amount of serosanguineous drainage noted. The wound margin is distinct with the outline attached to the wound base. There is no granulation within the wound bed. There is a large (67-100%) amount of necrotic tissue within the wound bed including Eschar. The periwound skin appearance did not exhibit: Moist. Periwound temperature was noted as No Abnormality. The periwound has tenderness  on palpation. Wound #3 status is Open. Original cause of wound was Pressure Injury. The wound is located on the BradyLAW, North CarolinaST[place] M. ([id number]) Left,Lateral Malleolus. The wound measures 1cm length x 1cm width x 0.1cm depth; 0.785cm^2 area and 0.079cm^3 volume. There is no tunneling or undermining noted. There is a large amount of serosanguineous drainage noted. The wound margin is distinct with the outline attached to the wound base. There is no granulation within the wound bed. There is a large (67-100%) amount of necrotic tissue within the wound bed including Eschar. Periwound temperature was noted as No Abnormality. The periwound has tenderness on palpation. Assessment Active Problems ICD-10 L89.612 - Pressure ulcer of right heel, stage 2 Procedures Wound #1 Wound #1 is a Pressure Ulcer located on the Right Calcaneus . There was a Skin/Subcutaneous Tissue Debridement ([id number]) debridement with total area of 0.01 sq cm performed by ROBSON, Venetia NightMICHAEL G,  MD. with the following instrument(s): Curette to remove Viable and Non-Viable tissue/material including Exudate, Fibrin/Slough, and Subcutaneous after achieving pain control using Lidocaine 4% Topical Solution. A time out was conducted at 10:53, prior to the start of the procedure. A Minimum amount of bleeding was controlled with Pressure. The procedure was tolerated well with a pain level of 0 throughout and a pain level of 0 following the procedure. Post Debridement Measurements: 0.6cm length x 0.3cm width x 0.1cm depth; 0.014cm^3 volume. Post debridement Stage noted as Category/Stage II. Character of Wound/Ulcer Post Debridement requires further debridement. Severity of Tissue Post Debridement is: Fat layer exposed. Post procedure Diagnosis Wound #1: Same as Pre-Procedure Plan Wound Cleansing: Wound #1 Right Calcaneus: Clean wound with Normal Saline. Anesthetic: Wound #1 Right Calcaneus: Pressley, [name] SOMOZA. ([id number]) Topical Lidocaine  4% cream applied to wound bed prior to debridement Primary Wound Dressing: Wound #1 Right Calcaneus: Promogran - moisten with saline Secondary Dressing: Wound #1 Right Calcaneus: Dry Gauze Conform/Kerlix - tape, netting Foam - heel cup Dressing Change Frequency: Wound #1 Right Calcaneus: Change dressing every other day. Follow-up Appointments: Wound #1 Right Calcaneus: Return Appointment in 1 week. Edema Control: Wound #1 Right Calcaneus: Elevate legs to the level of the heart and pump ankles as often as possible Off-Loading: Wound #1 Right Calcaneus: Other: - off loading darco shoe Additional Orders / Instructions: Wound #1 Right Calcaneus: Increase protein intake. #1 to the right heel reapplied collagen heel cup kerlix and conform. Gave her a heel offloading boot. The 2 areas on the left foot I just put border foam on these I don't expect these to be a progressive problem Electronic Signature(s) Signed: 05/15/2016 11:46:45 AM By: Elliot Gurney RN, BSN, Kim RN, BSN Signed: 05/27/2016 7:49:31 AM By: Baltazar Najjar MD Previous Signature: 05/14/2016 4:57:31 PM Version By: Baltazar Najjar MD Entered By: Elliot Gurney, RN, BSN, Kim on 05/15/2016 11:46:45 Fann, [name] ([id number]) -------------------------------------------------------------------------------- ROS/PFSH Details Patient Name: GLENNDA, WEATHERHOLTZ. Date of Service: 05/14/2016 9:45 AM Medical Record Patient Account Number: [id number] Number: Treating RN: Phillis Haggis 1929-11-22 (82 y.o. Other Clinician: Date of Birth/Sex: Female) Treating ROBSON, MICHAEL Primary Care Physician/Extender: Uvaldo Rising, DONALD Physician: Referring Physician: Genia Del in Treatment: 0 Information Obtained From Patient Wound History Do you currently have one or more open woundso Yes How many open wounds do you currently haveo 1 Approximately how long have you had your woundso 04/08/16 How have you been treating your wound(s)  until nowo ABT ointment Has your wound(s) ever healed and then re-openedo No Have you had any lab work done in the past montho No Have you tested positive for an antibiotic resistant organism (MRSA, VRE)o No Have you tested positive for osteomyelitis (bone infection)o No Have you had any tests for circulation on your legso No Have you had other problems associated with your woundso Swelling Eyes Complaints and Symptoms: Positive for: Glasses / Contacts Medical History: Positive for: Cataracts - surgery; Glaucoma Respiratory Complaints and Symptoms: Positive for: Shortness of Breath - on exertion Medical History: Positive for: Chronic Obstructive Pulmonary Disease (COPD) Integumentary (Skin) Complaints and Symptoms: Positive for: Wounds Constitutional Symptoms (General Health) Seddon, CHARIE PINKUS. ([id number]) Complaints and Symptoms: No Complaints or Symptoms Ear/Nose/Mouth/Throat Complaints and Symptoms: No Complaints or Symptoms Hematologic/Lymphatic Complaints and Symptoms: No Complaints or Symptoms Cardiovascular Medical History: Positive for: Hypertension Gastrointestinal Complaints and Symptoms: No Complaints or Symptoms Endocrine Complaints and Symptoms: No Complaints or Symptoms Genitourinary Complaints and Symptoms: No Complaints or Symptoms Immunological Complaints and Symptoms:  No Complaints or Symptoms Musculoskeletal Medical History: Positive for: Osteoarthritis Neurologic Complaints and Symptoms: No Complaints or Symptoms Oncologic Complaints and Symptoms: No Complaints or Symptoms Hallowell, Aubrey M. ([id number]) Psychiatric Complaints and Symptoms: No Complaints or Symptoms HBO Extended History Items Eyes: Eyes: Cataracts Glaucoma Immunizations Pneumococcal Vaccine: Received Pneumococcal Vaccination: Yes Family and Social History Cancer: Yes - Siblings; Diabetes: No; Heart Disease: No; Hereditary Spherocytosis: No; Hypertension: No; Kidney Disease:  No; Lung Disease: No; Seizures: No; Stroke: Yes - Father; Thyroid Problems: No; Tuberculosis: No; Former smoker - quit 2002; Marital Status - Widowed; Alcohol Use: Never; Drug Use: No History; Caffeine Use: Daily; Financial Concerns: No; Food, Clothing or Shelter Needs: No; Support System Lacking: No; Transportation Concerns: No; Advanced Directives: Yes (Not Provided); Do not resuscitate: No; Living Will: Yes (Not Provided); Medical Power of Attorney: Yes - Jeanella Flattery (Not Provided) Electronic Signature(s) Signed: 05/14/2016 4:57:31 PM By: Baltazar Najjar MD Signed: 05/15/2016 4:54:36 PM By: Alejandro Mulling Entered By: Alejandro Mulling on 05/14/2016 10:22:42 Danielski, [name] ([id number]) -------------------------------------------------------------------------------- SuperBill Details Patient Name: HAZELLE, WOOLLARD. Date of Service: 05/14/2016 Medical Record Patient Account Number: [id number] Number: Treating RN: Phillis Haggis [birth year] (82 y.o. Other Clinician: Date of Birth/Sex: Female) Treating ROBSON, MICHAEL Primary Care Physician/Extender: Uvaldo Rising, DONALD Physician: Tania Ade in Treatment: 0 Referring Physician: Mila Merry Diagnosis Coding ICD-10 Codes Code Description [phone] Pressure ulcer of right heel, stage 2 Facility Procedures CPT4 Code: [id number] Description: 99213 - WOUND CARE VISIT-LEV 3 EST PT Modifier: Quantity: 1 CPT4 Code: [id number] Description: 11042 - DEB SUBQ TISSUE 20 SQ CM/< ICD-10 Description Diagnosis L89.612 Pressure ulcer of right heel, stage 2 Modifier: Quantity: 1 Physician Procedures CPT4 Code: [id number] Description: WC PHYS LEVEL 3 o NEW PT ICD-10 Description Diagnosis L89.612 Pressure ulcer of right heel, stage 2 Modifier: 25 Quantity: 1 CPT4 Code: [id number] Description: 11042 - WC PHYS SUBQ TISS 20 SQ CM ICD-10 Description Diagnosis L89.612 Pressure ulcer of right heel, stage 2 Modifier: Quantity: 1 Electronic  Signature(s) Signed: 05/14/2016 4:57:31 PM By: Baltazar Najjar MD Signed: 05/15/2016 4:54:36 PM By: Alejandro Mulling Entered By: Alejandro Mulling on 05/14/2016 16:44:16

## 2016-06-13 ENCOUNTER — Ambulatory Visit (INDEPENDENT_AMBULATORY_CARE_PROVIDER_SITE_OTHER): Payer: Medicare Other | Admitting: Family Medicine

## 2016-06-13 ENCOUNTER — Encounter: Payer: Self-pay | Admitting: Family Medicine

## 2016-06-13 VITALS — BP 140/64 | HR 80 | Temp 98.6°F | Resp 16

## 2016-06-13 DIAGNOSIS — L03115 Cellulitis of right lower limb: Secondary | ICD-10-CM | POA: Diagnosis not present

## 2016-06-13 MED ORDER — AMOXICILLIN 500 MG PO CAPS: 1000.0000 mg | ORAL_CAPSULE | Freq: Two times a day (BID) | ORAL | 0 refills | Status: DC

## 2016-06-13 NOTE — Progress Notes (Signed)
[name] Female    DOB: 1929/12/01   82 y.o.   MRN: [id number] Visit Date: 06/13/2016  Today's Provider: Mila Merryonald Fisher, MD   Chief Complaint  Patient presents with  . Foot Problem   Subjective:    Patient's right heel is swollen and red. Swelling and heat in heel on right foot started 4 days ago.   Was seen for this in November and had cultures drawn as below. Was treated with amoxicillin and referred to wound clinic. She states she was release from wound care clinic after two visit.  States heel is now painful and having to walk on tip toes, but is not draining.  Results for orders placed or performed in visit on 05/06/16  Wound culture  Result Value Ref Range   Gram Stain Result Final report    Result 1 Comment    RESULT 2 No organisms seen    Aerobic Bacterial Culture Final report (A)    Result 1 Enterococcus species (A)    ANTIMICROBIAL SUSCEPTIBILITY Comment            Allergies  Allergen Reactions  . Shellfish Allergy   . Codeine     headache  . Prednisone     Altered Mental Status  . Tetracycline      Current Outpatient Prescriptions:  .  albuterol (PROVENTIL HFA;VENTOLIN HFA) 108 (90 Base) MCG/ACT inhaler, Inhale 2 puffs into the lungs every 4 (four) hours as needed for wheezing or shortness of breath., Disp: 3 Inhaler, Rfl: 1 .  arformoterol (BROVANA) 15 MCG/2ML NEBU, Take 2 mLs (15 mcg total) by nebulization 2 (two) times daily., Disp: 360 mL, Rfl: 3 .  aspirin EC 81 MG EC tablet, Take 1 tablet (81 mg total) by mouth daily., Disp: 30 tablet, Rfl: 0 .  cholecalciferol (VITAMIN D) 1000 UNITS tablet, Take 1,000 Units by mouth daily., Disp: , Rfl:  .  dorzolamide (TRUSOPT) 2 % ophthalmic solution, Place 1 drop into both eyes 2 (two) times daily. , Disp: , Rfl:  .  HYDROcodone-acetaminophen (NORCO/VICODIN) 5-325 MG tablet, Take 1 tablet by mouth every 6 (six) hours as needed for moderate pain., Disp: 30 tablet, Rfl: 0 .  latanoprost  (XALATAN) 0.005 % ophthalmic solution, Place 1 drop into both eyes at bedtime., Disp: , Rfl:  .  Multiple Vitamins-Minerals (CENTRUM SILVER ADULT 50+ PO), Take 1 tablet by mouth daily., Disp: , Rfl:  .  polyethylene glycol powder (GLYCOLAX/MIRALAX) powder, Dissolve 1 tablespoon in 4-8 ounces of juice or water and drink once daily., Disp: 255 g, Rfl: 0 .  metoprolol succinate (TOPROL-XL) 25 MG 24 hr tablet, Take 0.5 tablets (12.5 mg total) by mouth daily. (Patient not taking: Reported on 04/30/2016), Disp: 15 tablet, Rfl: 0  Review of Systems  Constitutional: Negative for appetite change, chills, fatigue and fever.  Respiratory: Negative for chest tightness and shortness of breath.   Cardiovascular: Negative for chest pain and palpitations.  Gastrointestinal: Negative for abdominal pain, nausea and vomiting.  Neurological: Negative for dizziness and weakness.    Social History  Substance Use Topics  . Smoking status: Former Smoker    Packs/day: 1.00    Years: 50.00    Types: Cigarettes    Quit date: 06/30/2000  . Smokeless tobacco: Never Used  . Alcohol use No   Objective:   BP 140/64 (BP Location: Left Arm, Patient Position: Sitting, Cuff Size: Normal)   Pulse 80   Temp 98.6 F (37  C) (Oral)   Resp 16   SpO2 94%   Physical Exam  General appearance: alert, well developed, well nourished, cooperative and in no distress Head: Normocephalic, without obvious abnormality, atraumatic Respiratory: Respirations even and unlabored, normal respiratory rate Extremities: Red tender and slightly swollen right posterior heel. No ulcerations, no drainage.      Assessment & Plan:     1. Cellulitis of heel, right  - amoxicillin (AMOXIL) 500 MG capsule; Take 2 capsules (1,000 mg total) by mouth 2 (two) times daily.  Dispense: 56 capsule; Refill: 0  Return in about 2 weeks (around 06/27/2016).     The entirety of the information documented in the History of Present Illness, Review of Systems  and Physical Exam were personally obtained by me. Portions of this information were initially documented by Samara DeistAnastasiya Aleksandrova, CMA and reviewed by me for thoroughness and accuracy.    Mila Merryonald Fisher, MD  [facility] +

## 2016-06-24 ENCOUNTER — Ambulatory Visit (INDEPENDENT_AMBULATORY_CARE_PROVIDER_SITE_OTHER): Payer: Medicare Other | Admitting: Family Medicine

## 2016-06-24 ENCOUNTER — Encounter: Payer: Self-pay | Admitting: Family Medicine

## 2016-06-24 VITALS — BP 138/64 | HR 82 | Temp 97.6°F | Resp 16 | Wt 116.0 lb

## 2016-06-24 DIAGNOSIS — L03115 Cellulitis of right lower limb: Secondary | ICD-10-CM

## 2016-06-24 NOTE — Progress Notes (Signed)
[name] Female    DOB: 16-Mar-1930   82 y.o.   MRN: [id number] Visit Date: 06/24/2016  Today's Provider: Mila Merryonald Ricky Doan, MD   Chief Complaint  Patient presents with  . Cellulitis    2 week follow up   Subjective:    HPI Pt is here for a 2 week follow up of cellulitis of her right heel. She is still taking amoxil 500 mg 2 BID. She has a few days left of the medication. She reports that the heel looks and feels much better. However she is still having right leg pain and it is worsening. She reports that this is the leg that she has a hip fracture in and had surgery to repair it. The pain is located in the thigh and knee area.     Allergies  Allergen Reactions  . Shellfish Allergy   . Codeine     headache  . Prednisone     Altered Mental Status  . Tetracycline      Current Outpatient Prescriptions:  .  albuterol (PROVENTIL HFA;VENTOLIN HFA) 108 (90 Base) MCG/ACT inhaler, Inhale 2 puffs into the lungs every 4 (four) hours as needed for wheezing or shortness of breath., Disp: 3 Inhaler, Rfl: 1 .  amoxicillin (AMOXIL) 500 MG capsule, Take 2 capsules (1,000 mg total) by mouth 2 (two) times daily., Disp: 56 capsule, Rfl: 0 .  arformoterol (BROVANA) 15 MCG/2ML NEBU, Take 2 mLs (15 mcg total) by nebulization 2 (two) times daily., Disp: 360 mL, Rfl: 3 .  aspirin EC 81 MG EC tablet, Take 1 tablet (81 mg total) by mouth daily., Disp: 30 tablet, Rfl: 0 .  cholecalciferol (VITAMIN D) 1000 UNITS tablet, Take 1,000 Units by mouth daily., Disp: , Rfl:  .  dorzolamide (TRUSOPT) 2 % ophthalmic solution, Place 1 drop into both eyes 2 (two) times daily. , Disp: , Rfl:  .  latanoprost (XALATAN) 0.005 % ophthalmic solution, Place 1 drop into both eyes at bedtime., Disp: , Rfl:  .  Multiple Vitamins-Minerals (CENTRUM SILVER ADULT 50+ PO), Take 1 tablet by mouth daily., Disp: , Rfl:  .  polyethylene glycol powder (GLYCOLAX/MIRALAX) powder, Dissolve 1 tablespoon in 4-8 ounces of juice  or water and drink once daily., Disp: 255 g, Rfl: 0 .  HYDROcodone-acetaminophen (NORCO/VICODIN) 5-325 MG tablet, Take 1 tablet by mouth every 6 (six) hours as needed for moderate pain. (Patient not taking: Reported on 06/24/2016), Disp: 30 tablet, Rfl: 0 .  metoprolol succinate (TOPROL-XL) 25 MG 24 hr tablet, Take 0.5 tablets (12.5 mg total) by mouth daily. (Patient not taking: Reported on 04/30/2016), Disp: 15 tablet, Rfl: 0  Review of Systems  Constitutional: Negative.   HENT: Negative.   Eyes: Negative.   Respiratory: Negative.   Endocrine: Negative.   Genitourinary: Negative.   Musculoskeletal: Positive for arthralgias.  Skin: Positive for wound (right heel).  Allergic/Immunologic: Negative.   Neurological: Negative.   Hematological: Negative.   Psychiatric/Behavioral: Negative.     Social History  Substance Use Topics  . Smoking status: Former Smoker    Packs/day: 1.00    Years: 50.00    Types: Cigarettes    Quit date: 06/30/2000  . Smokeless tobacco: Never Used  . Alcohol use No   Objective:   BP 138/64 (BP Location: Right Arm, Patient Position: Sitting, Cuff Size: Normal)   Pulse 82   Temp 97.6 F (36.4 C) (Oral)   Resp 16   Wt 116 lb (52.6  kg)   SpO2 94%   BMI 21.22 kg/m   Physical Exam  Small area of callus on right heal. No open wounds. No erythema. No discharge.     Assessment & Plan:     1. Cellulitis of heel, right Resolved. To finish current antibiotics. To call if any additional symptoms develop.       Mila Merryonald Sheri Gatchel, MD  [facility]

## 2016-08-05 ENCOUNTER — Ambulatory Visit (INDEPENDENT_AMBULATORY_CARE_PROVIDER_SITE_OTHER): Payer: Medicare Other | Admitting: Internal Medicine

## 2016-08-05 ENCOUNTER — Encounter (INDEPENDENT_AMBULATORY_CARE_PROVIDER_SITE_OTHER): Payer: Self-pay

## 2016-08-05 ENCOUNTER — Encounter: Payer: Self-pay | Admitting: Internal Medicine

## 2016-08-05 VITALS — BP 126/72 | HR 102 | Ht 62.0 in | Wt 114.6 lb

## 2016-08-05 DIAGNOSIS — J9611 Chronic respiratory failure with hypoxia: Secondary | ICD-10-CM | POA: Diagnosis not present

## 2016-08-05 DIAGNOSIS — J449 Chronic obstructive pulmonary disease, unspecified: Secondary | ICD-10-CM

## 2016-08-05 MED ORDER — BUDESONIDE 0.5 MG/2ML IN SUSP: 0.5000 mg | Freq: Two times a day (BID) | RESPIRATORY_TRACT | 3 refills | Status: DC

## 2016-08-05 NOTE — Addendum Note (Signed)
Addended by: Maxwell MarionBLANKENSHIP, Queena Monrreal A on: 08/05/2016 11:31 AM   Modules accepted: Orders

## 2016-08-05 NOTE — Progress Notes (Signed)
   Subjective:    Patient ID: [name] [name], female    DOB: 1929-09-11, 83 y.o.   MRN: [id number]  Synopsis:: Gold grade D. COPD, 2015 FEV1 26% predicted   CC: follow up COPD HPI  Has chronic SOB, her WOB has increased over last several months che was admitted recently for chest pain and had extensive work up per patient She is using the oxygen given by [facility] and it really helps.   She takes the HoweBrovana and it helps. No signs of infection at this time   Past Medical History:  Diagnosis Date  . Back pain   . Chronic respiratory failure (HCC)   . COPD (chronic obstructive pulmonary disease) (HCC)   . Glaucoma   . Insomnia   . Nocturnal leg cramps   . Osteoarthritis      Review of Systems  Constitutional: Positive for fatigue. Negative for chills and fever.  HENT: Negative for postnasal drip, rhinorrhea and sinus pressure.   Respiratory: Positive for shortness of breath. Negative for cough and wheezing.   Cardiovascular: Negative for chest pain, palpitations and leg swelling.       Objective:   Physical Exam There were no vitals filed for this visit. RA  Gen: well appearing, no acute distress HEENT: NCAT, EOMi, OP clear PULM: Poor air movement, no wheezing CV: RRR, systolic murmur noted, no JVD AB: BS+, soft, nontender Ext: warm, no edema, no clubbing, no cyanosis Derm: no rash or skin breakdown Neuro: A&Ox4, moves all extremities well       Assessment & Plan:   83 yo pleasant female with end stage COPD with Chronic Resp failure on oxygen Overall, her resp status is doing OK and stable, no signs of exacerbation at this time  1.continue Brovana BID neb therapy 2.start Pulmicort Neb therapy BID 3.oxygen as prescribed 4.referral to [facility] to get Neb supplies   Follow up in 3 months     I have personally obtained a history, examined the patient, evaluated Pertinent laboratory and RadioGraphic/imaging results, and  formulated the assessment and  plan   The Patient requires high complexity decision making for assessment and support, frequent evaluation and titration of therapies.  Patient satisfied with Plan of action and management. All questions answered  Lucie LeatherKurian David Santino Kinsella, M.D.  [facility]  Medical Director [facility] Medical Director [facility]

## 2016-08-05 NOTE — Patient Instructions (Signed)
Start Pulmicort Nebs twice daily

## 2016-08-05 NOTE — Addendum Note (Signed)
Addended by: Maxwell MarionBLANKENSHIP, MARGIE A on: 08/05/2016 11:35 AM   Modules accepted: Orders

## 2016-08-22 ENCOUNTER — Ambulatory Visit (INDEPENDENT_AMBULATORY_CARE_PROVIDER_SITE_OTHER): Payer: Medicare Other | Admitting: Family Medicine

## 2016-08-22 ENCOUNTER — Encounter: Payer: Self-pay | Admitting: Family Medicine

## 2016-08-22 DIAGNOSIS — L03115 Cellulitis of right lower limb: Secondary | ICD-10-CM | POA: Diagnosis not present

## 2016-08-22 MED ORDER — AMOXICILLIN 500 MG PO CAPS: 1000.0000 mg | ORAL_CAPSULE | Freq: Two times a day (BID) | ORAL | 0 refills | Status: AC

## 2016-08-22 NOTE — Progress Notes (Signed)
       [name] Female    DOB: 18-Oct-1929   83 y.o.   MRN: [id number] Visit Date: 08/22/2016  Today's Provider: Mila Merryonald Jadesola Poynter, MD   Chief Complaint  Patient presents with  . Foot Problem   Subjective:    HPI   Foot Problem Pt is c/o possible heel infection of right foot. Pt has noticed this problem for 5-6 days. On 05/06/2016, pt had a foot wound culture that was positive for enterococcus. Pt was treated with Amoxicillin 500 mg BID x 10 days. Pt reports the current wound is painful, about a 4/10 on a pain scale. Pain is relieved with elevation. Pt denies redness.  Allergies  Allergen Reactions  . Shellfish Allergy   . Codeine     headache  . Prednisone     Altered Mental Status  . Tetracycline      Current Outpatient Prescriptions:  .  Albuterol Sulfate (PROAIR HFA IN), Inhale into the lungs., Disp: , Rfl:  .  arformoterol (BROVANA) 15 MCG/2ML NEBU, Take 2 mLs (15 mcg total) by nebulization 2 (two) times daily., Disp: 360 mL, Rfl: 3 .  aspirin 325 MG tablet, Take 325 mg by mouth every other day as needed., Disp: , Rfl:  .  cholecalciferol (VITAMIN D) 1000 UNITS tablet, Take 1,000 Units by mouth daily., Disp: , Rfl:  .  dorzolamide (TRUSOPT) 2 % ophthalmic solution, Place 1 drop into both eyes 3 (three) times daily. , Disp: , Rfl:  .  latanoprost (XALATAN) 0.005 % ophthalmic solution, Place 1 drop into both eyes at bedtime., Disp: , Rfl:  .  Multiple Vitamins-Minerals (CENTRUM SILVER ADULT 50+ PO), Take 1 tablet by mouth daily., Disp: , Rfl:   Review of Systems  Constitutional: Positive for fatigue. Negative for activity change, appetite change, chills, diaphoresis, fever and unexpected weight change.  Skin: Positive for wound. Negative for color change.    Social History  Substance Use Topics  . Smoking status: Former Smoker    Packs/day: 1.00    Years: 50.00    Types: Cigarettes    Quit date: 06/30/2000  . Smokeless tobacco: Never Used  . Alcohol use No     Objective:   BP 138/68 (BP Location: Left Arm, Patient Position: Sitting, Cuff Size: Large)   Pulse 80   Temp 97.8 F (36.6 C) (Oral)   Resp (!) 22   Wt 119 lb (54 kg)   BMI 21.77 kg/m   Physical Exam  Slightly inflamed circular area of right hear surrounded by peeling skin. No streaks or erythema into ankle or foot.     Assessment & Plan:     1. Cellulitis of heel, right She has left topical antibioitic which she is to apply daily and keep area covered with adhesive bandage.  - amoxicillin (AMOXIL) 500 MG capsule; Take 2 capsules (1,000 mg total) by mouth 2 (two) times daily.  Dispense: 40 capsule; Refill: 0  Call if symptoms change or if not rapidly improving.        Patient seen and examined by Mila Merryonald Charron Coultas, MD, and note scribed by Allene DillonEmily Drozdowski, CMA.  Mila Merryonald Takita Riecke, MD  [facility]

## 2017-01-05 ENCOUNTER — Other Ambulatory Visit: Payer: Self-pay | Admitting: Family Medicine

## 2017-01-05 MED ORDER — ARFORMOTEROL TARTRATE 15 MCG/2ML IN NEBU: 15.0000 ug | INHALATION_SOLUTION | Freq: Two times a day (BID) | RESPIRATORY_TRACT | 4 refills | Status: DC

## 2017-01-05 NOTE — Telephone Encounter (Signed)
Pt contacted office for refill request on the following medications:  arformoterol (BROVANA) 15 MCG/2ML NEBU.  [facility] mail order.  CB#[phone]/MW

## 2017-02-25 ENCOUNTER — Other Ambulatory Visit: Payer: Self-pay | Admitting: *Deleted

## 2017-02-25 ENCOUNTER — Ambulatory Visit (INDEPENDENT_AMBULATORY_CARE_PROVIDER_SITE_OTHER): Payer: Medicare Other | Admitting: Family Medicine

## 2017-02-25 ENCOUNTER — Encounter: Payer: Self-pay | Admitting: Family Medicine

## 2017-02-25 VITALS — BP 130/70 | HR 94 | Temp 98.4°F | Resp 18 | Wt 117.0 lb

## 2017-02-25 DIAGNOSIS — L03115 Cellulitis of right lower limb: Secondary | ICD-10-CM | POA: Diagnosis not present

## 2017-02-25 DIAGNOSIS — J449 Chronic obstructive pulmonary disease, unspecified: Secondary | ICD-10-CM

## 2017-02-25 MED ORDER — FLUTICASONE-UMECLIDIN-VILANT 100-62.5-25 MCG/INH IN AEPB: 1.0000 | INHALATION_SPRAY | Freq: Every day | RESPIRATORY_TRACT | 0 refills | Status: DC

## 2017-02-25 MED ORDER — ALBUTEROL SULFATE HFA 108 (90 BASE) MCG/ACT IN AERS: 2.0000 | INHALATION_SPRAY | Freq: Four times a day (QID) | RESPIRATORY_TRACT | 2 refills | Status: DC | PRN

## 2017-02-25 MED ORDER — CEPHALEXIN 500 MG PO CAPS: 500.0000 mg | ORAL_CAPSULE | Freq: Three times a day (TID) | ORAL | 0 refills | Status: DC

## 2017-02-25 NOTE — Patient Instructions (Signed)
  Medications Discontinued During This Encounter  Medication Reason  . arformoterol (BROVANA) 15 MCG/2ML NEBU     Meds ordered this encounter  Medications  . cephALEXin (KEFLEX) 500 MG capsule    Sig: Take 1 capsule (500 mg total) by mouth 3 (three) times daily. For 10 days    Dispense:  30 capsule    Refill:  0  . Fluticasone-Umeclidin-Vilant (TRELEGY ELLIPTA) 100-62.5-25 MCG/INH AEPB    Sig: Inhale 1 puff into the lungs daily.    Dispense:  2 each    Refill:  0    Adult vaccines due  Topic Date Due  . TETANUS/TDAP  11/12/1950    Health Maintenance Due  Topic Date Due  . TETANUS/TDAP  11/12/1950  . DEXA SCAN  11/11/1996  . INFLUENZA VACCINE  01/28/2017

## 2017-02-25 NOTE — Progress Notes (Signed)
[name] Female    DOB: 01/28/1930   83 y.o.   MRN: [id number] Visit Date: 02/25/2017  Today's Provider: Mila Merry, MD   No chief complaint on file.  Subjective:    Patient is here to discuss her COPD which has gotten worse in the last few months.   She was seen by Dr. Belia Heman in February and prescribed pulmicort nebs, but she didn't was afraid to take a steroid inhaler so is still only using Brovana and prn pro-air.   She also states her right heel has been sore for a few weeks. Has a history of infection and ulceration in this heels requiring antibiotic in the past.     Allergies  Allergen Reactions  . Shellfish Allergy   . Codeine     headache  . Prednisone     Altered Mental Status  . Tetracycline      Current Outpatient Prescriptions:  .  Albuterol Sulfate (PROAIR HFA IN), Inhale into the lungs., Disp: , Rfl:  .  arformoterol (BROVANA) 15 MCG/2ML NEBU, Take 2 mLs (15 mcg total) by nebulization 2 (two) times daily., Disp: 360 mL, Rfl: 4 .  aspirin 325 MG tablet, Take 325 mg by mouth every other day as needed., Disp: , Rfl:  .  cholecalciferol (VITAMIN D) 1000 UNITS tablet, Take 1,000 Units by mouth daily., Disp: , Rfl:  .  dorzolamide (TRUSOPT) 2 % ophthalmic solution, Place 1 drop into both eyes 3 (three) times daily. , Disp: , Rfl:  .  latanoprost (XALATAN) 0.005 % ophthalmic solution, Place 1 drop into both eyes at bedtime., Disp: , Rfl:  .  Multiple Vitamins-Minerals (CENTRUM SILVER ADULT 50+ PO), Take 1 tablet by mouth daily., Disp: , Rfl:   Review of Systems  Constitutional: Negative for appetite change, chills, fatigue and fever.  HENT: Positive for congestion.   Respiratory: Positive for cough, chest tightness, shortness of breath and wheezing.   Cardiovascular: Negative for chest pain and palpitations.  Gastrointestinal: Negative for abdominal pain, nausea and vomiting.  Neurological: Negative for dizziness and weakness.    Social  History  Substance Use Topics  . Smoking status: Former Smoker    Packs/day: 1.00    Years: 50.00    Types: Cigarettes    Quit date: 06/30/2000  . Smokeless tobacco: Never Used  . Alcohol use No   Objective:   BP 130/70 (BP Location: Right Arm, Patient Position: Sitting, Cuff Size: Normal)   Pulse 94   Temp 98.4 F (36.9 C) (Oral)   Resp 18   Wt 117 lb (53.1 kg)   SpO2 98% (2 lpm)   BMI 21.40 kg/m      Physical Exam   General Appearance:    Alert, cooperative, no distress  Eyes:    PERRL, conjunctiva/corneas clear, EOM's intact       Lungs:     diminsihed breath sounds, respirations unlabored  Heart:    Regular rate and rhythm  derm:   Faint erythema and slight tenderness right posterior heel.           Assessment & Plan:     1. Chronic obstructive pulmonary disease, unspecified COPD type (HCC) Change borvana to  - Fluticasone-Umeclidin-Vilant (TRELEGY ELLIPTA) 100-62.5-25 MCG/INH AEPB; Inhale 1 puff into the lungs daily.  Dispense: 2 each; Refill: 0  Given samples and advised to call for prescription if improving in 1-2 weeks.   2. Cellulitis of heel, right  - cephALEXin (  KEFLEX) 500 MG capsule; Take 1 capsule (500 mg total) by mouth 3 (three) times daily. For 10 days  Dispense: 30 capsule; Refill: 0 \ Call if symptoms change or if not rapidly improving.           Mila Merryonald Astoria Condon, MD  [facility]

## 2017-03-16 ENCOUNTER — Telehealth: Payer: Self-pay | Admitting: Family Medicine

## 2017-03-16 DIAGNOSIS — J449 Chronic obstructive pulmonary disease, unspecified: Secondary | ICD-10-CM

## 2017-03-16 MED ORDER — FLUTICASONE-UMECLIDIN-VILANT 100-62.5-25 MCG/INH IN AEPB: 1.0000 | INHALATION_SPRAY | Freq: Every day | RESPIRATORY_TRACT | 4 refills | Status: DC

## 2017-03-16 NOTE — Telephone Encounter (Signed)
Pt contacted office for refill request on the following medications:  Fluticasone-Umeclidin-Vilant (TRELEGY ELLIPTA) 100-62.5-25 MCG/INH AEPB   [facility].  CB#[phone]. Pt states this is working great/MW

## 2017-04-05 DIAGNOSIS — J9611 Chronic respiratory failure with hypoxia: Secondary | ICD-10-CM | POA: Diagnosis not present

## 2017-04-05 DIAGNOSIS — J439 Emphysema, unspecified: Secondary | ICD-10-CM | POA: Diagnosis not present

## 2017-04-07 DIAGNOSIS — J439 Emphysema, unspecified: Secondary | ICD-10-CM | POA: Diagnosis not present

## 2017-04-07 DIAGNOSIS — J449 Chronic obstructive pulmonary disease, unspecified: Secondary | ICD-10-CM | POA: Diagnosis not present

## 2017-04-13 DIAGNOSIS — J439 Emphysema, unspecified: Secondary | ICD-10-CM | POA: Diagnosis not present

## 2017-04-13 DIAGNOSIS — J9611 Chronic respiratory failure with hypoxia: Secondary | ICD-10-CM | POA: Diagnosis not present

## 2017-05-06 DIAGNOSIS — J439 Emphysema, unspecified: Secondary | ICD-10-CM | POA: Diagnosis not present

## 2017-05-06 DIAGNOSIS — J9611 Chronic respiratory failure with hypoxia: Secondary | ICD-10-CM | POA: Diagnosis not present

## 2017-05-08 ENCOUNTER — Telehealth: Payer: Self-pay | Admitting: Internal Medicine

## 2017-05-08 DIAGNOSIS — J439 Emphysema, unspecified: Secondary | ICD-10-CM | POA: Diagnosis not present

## 2017-05-08 DIAGNOSIS — J449 Chronic obstructive pulmonary disease, unspecified: Secondary | ICD-10-CM | POA: Diagnosis not present

## 2017-05-08 NOTE — Telephone Encounter (Signed)
Attempted to schedule fu from recall patient does not want to see provider .  Deleting recall per patient request

## 2017-05-14 DIAGNOSIS — J9611 Chronic respiratory failure with hypoxia: Secondary | ICD-10-CM | POA: Diagnosis not present

## 2017-05-14 DIAGNOSIS — J439 Emphysema, unspecified: Secondary | ICD-10-CM | POA: Diagnosis not present

## 2017-05-19 ENCOUNTER — Telehealth: Payer: Self-pay

## 2017-05-19 NOTE — Telephone Encounter (Signed)
Contacted pt on her cell #. She was unable to hear due to being in a store at the time. Pt to call office back. Was trying to schedule pts AWV with [facility] around her up coming apt with Dr Sherrie MustacheFisher on 05/27/17.

## 2017-05-19 NOTE — Telephone Encounter (Signed)
Pt states she will call back later to schedule the AWV/MW

## 2017-05-27 ENCOUNTER — Telehealth: Payer: Self-pay | Admitting: Family Medicine

## 2017-05-27 ENCOUNTER — Encounter: Payer: Medicare Other | Admitting: Family Medicine

## 2017-05-27 MED ORDER — ALBUTEROL SULFATE HFA 108 (90 BASE) MCG/ACT IN AERS: 2.0000 | INHALATION_SPRAY | Freq: Four times a day (QID) | RESPIRATORY_TRACT | 2 refills | Status: DC | PRN

## 2017-05-27 NOTE — Telephone Encounter (Signed)
Pt is asking if she can stop taking the Rx Fluticasone-Umeclidin-Vilant (TRELEGY ELLIPTA) 100-62.5-25 MCG/INH AEPB and go back to using [place]ana.     Pt contacted office for refill request on the following medications:  albuterol (PROAIR HFA) 108 (90 Base) MCG/ACT inhaler   [facility] mail order.  CB#[phone]/MW

## 2017-05-27 NOTE — Progress Notes (Deleted)
[name], Female    DOB: [birth year], 83 y.o.   MRN: [id number] Visit Date: 05/27/2017  Today's Provider: Mila Merryonald Fisher, MD   No chief complaint on file.  Subjective:    Annual wellness visit [name] [name] is a 83 y.o. female. She feels {DESC; WELL/FAIRLY WELL/POORLY:18703}. She reports exercising ***. She reports she is sleeping {DESC; WELL/FAIRLY WELL/POORLY:18703}.  -----------------------------------------------------------  Chronic obstructive pulmonary disease, unspecified COPD type (HCC) From 02/25/2017-Change borvana to Fluticasone-Umeclidin-Vilant (TRELEGY ELLIPTA) 100-62.5-25 MCG/INH AEPB.  Given samples and advised to call for prescription if improving in 1-2 weeks.   Anemia, unspecified type From 04/22/2016-labs checked, no changes.    Review of Systems  Social History   Socioeconomic History  . Marital status: Single    Spouse name: Not on file  . Number of children: 2  . Years of education: Not on file  . Highest education level: Not on file  Social Needs  . Financial resource strain: Not on file  . Food insecurity - worry: Not on file  . Food insecurity - inability: Not on file  . Transportation needs - medical: Not on file  . Transportation needs - non-medical: Not on file  Occupational History  . Not on file  Tobacco Use  . Smoking status: Former Smoker    Packs/day: 1.00    Years: 50.00    Pack years: 50.00    Types: Cigarettes    Last attempt to quit: 06/30/2000    Years since quitting: 16.9  . Smokeless tobacco: Never Used  Substance and Sexual Activity  . Alcohol use: No  . Drug use: No  . Sexual activity: Not Currently  Other Topics Concern  . Not on file  Social History Narrative  . Not on file    Past Medical History:  Diagnosis Date  . Back pain   . Chronic respiratory failure (HCC)   . COPD (chronic obstructive pulmonary disease) (HCC)   . Glaucoma   . Insomnia   . Nocturnal leg cramps   .  Osteoarthritis      Patient Active Problem List   Diagnosis Date Noted  . Rhabdomyolysis 04/07/2016  . Acalculous cholecystitis 04/07/2016  . Fall 04/07/2016  . Glaucoma 11/29/2014  . Insomnia 11/29/2014  . Arthritis, degenerative 11/29/2014  . Dependence on supplemental oxygen 11/29/2014  . 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 COPD (chronic obstructive pulmonary disease) (HCC) 11/29/2013  . Chronic hypoxemic respiratory failure (HCC) 11/29/2013    Past Surgical History:  Procedure Laterality Date  . APPENDECTOMY  1947  . BREAST SURGERY  1969   biopsy  . HIP FRACTURE SURGERY Right 2008  . VAGINAL HYSTERECTOMY  1975    Her family history includes Cancer in her sister; Cerebral aneurysm in her father.      Current Outpatient Medications:  .  albuterol (PROAIR HFA) 108 (90 Base) MCG/ACT inhaler, Inhale 2 puffs into the lungs every 6 (six) hours as needed for wheezing or shortness of breath., Disp: 3 Inhaler, Rfl: 2 .  aspirin 325 MG tablet, Take 325 mg by mouth every other day as needed., Disp: , Rfl:  .  cephALEXin (KEFLEX) 500 MG capsule, Take 1 capsule (500 mg total) by mouth 3 (three) times daily. For 10 days, Disp: 30 capsule, Rfl: 0 .  cholecalciferol (VITAMIN D) 1000 UNITS tablet, Take 1,000 Units by mouth daily., Disp: , Rfl:  .  dorzolamide (TRUSOPT) 2 % ophthalmic solution, Place 1 drop into both eyes 3 (three)  times daily. , Disp: , Rfl:  .  Fluticasone-Umeclidin-Vilant (TRELEGY ELLIPTA) 100-62.5-25 MCG/INH AEPB, Inhale 1 puff into the lungs daily., Disp: 3 each, Rfl: 4 .  latanoprost (XALATAN) 0.005 % ophthalmic solution, Place 1 drop into both eyes at bedtime., Disp: , Rfl:  .  Multiple Vitamins-Minerals (CENTRUM SILVER ADULT 50+ PO), Take 1 tablet by mouth daily., Disp: , Rfl:   Patient Care Team: Malva LimesFisher, Donald E, MD as PCP - General (Family Medicine)     Objective:   Vitals: There were no vitals taken for this visit.  Physical Exam  Activities of Daily Living No  flowsheet data found.  Fall Risk Assessment Fall Risk  12/25/2015 11/30/2014  Falls in the past year? No No  Risk for fall due to : - Impaired vision;Impaired balance/gait     Depression Screen PHQ 2/9 Scores 12/25/2015 11/30/2014  PHQ - 2 Score 0 1    Cognitive Testing - 6-CIT  Correct? Score   What year is it? {yes no:22349} {0-4:31231} 0 or 4  What month is it? {yes no:22349} {0-3:21082} 0 or 3  Memorize:    [name] ParkinsJohn,  [name],  44,  High [address],  [place],      What time is it? (within 1 hour) {yes no:22349} {0-3:21082} 0 or 3  Count backwards from 20 {yes no:22349} {0-4:31231} 0, 2, or 4  Name the months of the year {yes no:22349} {0-4:31231} 0, 2, or 4  Repeat name & address above {yes no:22349} {0-10:5044} 0, 2, 4, 6, 8, or 10       TOTAL SCORE  ***/28   Interpretation:  {normal/abnormal:11317::"Normal"}  Normal (0-7) Abnormal (8-28)    Audit-C Alcohol Use Screening  Question Answer Points  How often do you have alcoholic drink? {Frequencies:19612::"never"} {Score; 0-4:31231::"0"}  On days you do drink alcohol, how many drinks do you typically consume? *** {Score; 0-4:31231::"0"}  How oftey will you drink 6 or more in a total? {Frequencies:19612::"never"} {Score; 0-4:31231::"0"}  Total Score:  {NUMBERS 0-12:18577::"0"}   A score of 3 or more in women, and 4 or more in men indicates increased risk for alcohol abuse, EXCEPT if all of the points are from question 1.     Assessment & Plan:     Annual Wellness Visit  Reviewed patient's Family Medical History Reviewed and updated list of patient's medical providers Assessment of cognitive impairment was done Assessed patient's functional ability Established a written schedule for health screening services Health Risk Assessent Completed and Reviewed  Exercise Activities and Dietary recommendations Goals    None      Immunization History  Administered Date(s) Administered  . Influenza, High Dose Seasonal PF 03/31/2013,  04/26/2015, 05/16/2016  . Influenza,inj,Quad PF,6+ Mos 06/12/2014  . Pneumococcal Conjugate-13 04/26/2015  . Pneumococcal Polysaccharide-23 01/22/2011    Health Maintenance  Topic Date Due  . TETANUS/TDAP  11/12/1950  . DEXA SCAN  11/11/1996  . INFLUENZA VACCINE  01/28/2017  . PNA vac Low Risk Adult  Completed     Discussed health benefits of physical activity, and encouraged her to engage in regular exercise appropriate for her age and condition.    ------------------------------------------------------------------------------------------------------------    Mila Merryonald Fisher, MD  [facility]

## 2017-05-27 NOTE — Telephone Encounter (Signed)
Patient states that when she is on Trelegy she experiences indigestion, constipation and nausea that last until the evening. Patient would like to discuss other medication options, patient states that she has an appt with you on Friday and will discuss with you more than. KW

## 2017-05-27 NOTE — Telephone Encounter (Signed)
Please advise 

## 2017-05-27 NOTE — Telephone Encounter (Signed)
No, the [name] GessBrovana is not a replacement for Trelegy, It helps a bit with shortness of breath, but does not improve lung function. She needs to keep taking Trelgy

## 2017-05-29 ENCOUNTER — Encounter: Payer: Self-pay | Admitting: Family Medicine

## 2017-05-29 ENCOUNTER — Ambulatory Visit (INDEPENDENT_AMBULATORY_CARE_PROVIDER_SITE_OTHER): Payer: Medicare Other | Admitting: Family Medicine

## 2017-05-29 VITALS — BP 150/84 | HR 100 | Temp 98.7°F | Resp 16 | Ht 62.0 in | Wt 119.0 lb

## 2017-05-29 DIAGNOSIS — J449 Chronic obstructive pulmonary disease, unspecified: Secondary | ICD-10-CM | POA: Diagnosis not present

## 2017-05-29 DIAGNOSIS — E2839 Other primary ovarian failure: Secondary | ICD-10-CM | POA: Diagnosis not present

## 2017-05-29 DIAGNOSIS — R739 Hyperglycemia, unspecified: Secondary | ICD-10-CM | POA: Diagnosis not present

## 2017-05-29 DIAGNOSIS — M25551 Pain in right hip: Secondary | ICD-10-CM | POA: Diagnosis not present

## 2017-05-29 DIAGNOSIS — Z0000 Encounter for general adult medical examination without abnormal findings: Secondary | ICD-10-CM | POA: Diagnosis not present

## 2017-05-29 DIAGNOSIS — Z23 Encounter for immunization: Secondary | ICD-10-CM | POA: Diagnosis not present

## 2017-05-29 DIAGNOSIS — G4700 Insomnia, unspecified: Secondary | ICD-10-CM

## 2017-05-29 LAB — POCT GLYCOSYLATED HEMOGLOBIN (HGB A1C)
Est. average glucose Bld gHb Est-mCnc: 117
Hemoglobin A1C: 5.7

## 2017-05-29 MED ORDER — ALBUTEROL SULFATE HFA 108 (90 BASE) MCG/ACT IN AERS: 2.0000 | INHALATION_SPRAY | Freq: Four times a day (QID) | RESPIRATORY_TRACT | 2 refills | Status: DC | PRN

## 2017-05-29 NOTE — Patient Instructions (Signed)
   The CDC recommends two doses of Shingrix (the shingles vaccine) separated by 2 to 6 months for adults age 81 years and older. I recommend checking with your insurance plan regarding coverage for this vaccine.   

## 2017-05-29 NOTE — Progress Notes (Signed)
[name], Female    DOB: [birth year], 83 y.o.   MRN: [id number] Visit Date: 05/29/2017  Today's Provider: Mila Merry, MD   Chief Complaint  Patient presents with  . Medicare Wellness  . COPD  . Anemia   Subjective:    Annual wellness visit [name] [name] is a 83 y.o. female. She feels fairly well. She reports exercising some/house work. She reports she is sleeping poorly.  -----------------------------------------------------------   Chronic obstructive pulmonary disease, unspecified COPD type (HCC) From 02/25/2017-Changed borvana to Fluticasone-Umeclidin-Vilant (TRELEGY ELLIPTA) 100-62.5-25 MCG/INH AEPB. She thinks this makes her nauseated and constipated. Has been taking both Brovana and Trelegy on most days.   Complains of right hip pain for about a month. Does not recall any specific injury. Hurts when she lays on it and when she walks. States aspirin helps.    Review of Systems  Constitutional: Negative for chills, diaphoresis and fever.  HENT: Negative for congestion, ear discharge, ear pain, hearing loss, nosebleeds, sore throat and tinnitus.   Eyes: Negative for photophobia, pain, discharge and redness.  Respiratory: Positive for chest tightness and shortness of breath. Negative for cough, wheezing and stridor.   Cardiovascular: Negative for chest pain, palpitations and leg swelling.  Gastrointestinal: Negative for abdominal pain, blood in stool, constipation, diarrhea, nausea and vomiting.  Endocrine: Negative for polydipsia.  Genitourinary: Negative for dysuria, flank pain, frequency, hematuria and urgency.  Musculoskeletal: Negative for back pain, myalgias and neck pain.  Skin: Negative for rash.  Allergic/Immunologic: Negative for environmental allergies.  Neurological: Negative for dizziness, tremors, seizures, weakness and headaches.  Hematological: Does not bruise/bleed easily.  Psychiatric/Behavioral: Negative for hallucinations and  suicidal ideas. The patient is not nervous/anxious.     Social History   Socioeconomic History  . Marital status: Single    Spouse name: Not on file  . Number of children: 2  . Years of education: Not on file  . Highest education level: Not on file  Social Needs  . Financial resource strain: Not on file  . Food insecurity - worry: Not on file  . Food insecurity - inability: Not on file  . Transportation needs - medical: Not on file  . Transportation needs - non-medical: Not on file  Occupational History  . Not on file  Tobacco Use  . Smoking status: Former Smoker    Packs/day: 1.00    Years: 50.00    Pack years: 50.00    Types: Cigarettes    Last attempt to quit: 06/30/2000    Years since quitting: 16.9  . Smokeless tobacco: Never Used  Substance and Sexual Activity  . Alcohol use: No  . Drug use: No  . Sexual activity: Not Currently  Other Topics Concern  . Not on file  Social History Narrative  . Not on file    Past Medical History:  Diagnosis Date  . Back pain   . Chronic respiratory failure (HCC)   . COPD (chronic obstructive pulmonary disease) (HCC)   . Glaucoma   . Insomnia   . Nocturnal leg cramps   . Osteoarthritis      Patient Active Problem List   Diagnosis Date Noted  . Rhabdomyolysis 04/07/2016  . Acalculous cholecystitis 04/07/2016  . Fall 04/07/2016  . Glaucoma 11/29/2014  . Insomnia 11/29/2014  . Arthritis, degenerative 11/29/2014  . Dependence on supplemental oxygen 11/29/2014  . 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 COPD (chronic obstructive pulmonary disease) (HCC) 11/29/2013  . Chronic hypoxemic respiratory  failure (HCC) 11/29/2013    Past Surgical History:  Procedure Laterality Date  . APPENDECTOMY  1947  . BREAST SURGERY  1969   biopsy  . HIP FRACTURE SURGERY Right 2008  . VAGINAL HYSTERECTOMY  1975    Her family history includes Cancer in her sister; Cerebral aneurysm in her father.      Current Outpatient Medications:  .  albuterol  (PROAIR HFA) 108 (90 Base) MCG/ACT inhaler, Inhale 2 puffs into the lungs every 6 (six) hours as needed for wheezing or shortness of breath., Disp: 3 Inhaler, Rfl: 2 .  aspirin 325 MG tablet, Take 325 mg by mouth every other day as needed., Disp: , Rfl:  .  cholecalciferol (VITAMIN D) 1000 UNITS tablet, Take 1,000 Units by mouth daily., Disp: , Rfl:  .  docusate sodium (COLACE) 50 MG capsule, Take 100 mg by mouth at bedtime., Disp: , Rfl:  .  dorzolamide (TRUSOPT) 2 % ophthalmic solution, Place 1 drop into both eyes 3 (three) times daily. , Disp: , Rfl:  .  Fluticasone-Umeclidin-Vilant (TRELEGY ELLIPTA) 100-62.5-25 MCG/INH AEPB, Inhale 1 puff into the lungs daily., Disp: 3 each, Rfl: 4 .  latanoprost (XALATAN) 0.005 % ophthalmic solution, Place 1 drop into both eyes at bedtime., Disp: , Rfl:  .  Multiple Vitamins-Minerals (CENTRUM SILVER ADULT 50+ PO), Take 1 tablet by mouth daily., Disp: , Rfl:   Patient Care Team: Malva LimesFisher, Tanza Pellot E, MD as PCP - General (Family Medicine)     Objective:   Vitals: BP (!) 150/84 (BP Location: Right Arm, Patient Position: Sitting, Cuff Size: Normal)   Pulse 100   Temp 98.7 F (37.1 C) (Oral)   Resp 16   Ht 5\' 2"  (1.575 m)   Wt 119 lb (54 kg)   SpO2 98%   BMI 21.77 kg/m   Physical Exam   General Appearance:    Alert, cooperative, no distress  Eyes:    PERRL, conjunctiva/corneas clear, EOM's intact       Lungs:     Clear to auscultation bilaterally, respirations unlabored  Heart:    Regular rate and rhythm  Neurologic:   Awake, alert, oriented x 3. No apparent focal neurological           defect.   MS:    Moderate tenderness right lateral and anterior leg.able to bear weight, but with pain.     Activities of Daily Living In your present state of health, do you have any difficulty performing the following activities: 05/29/2017  Hearing? Y  Vision? Y  Difficulty concentrating or making decisions? N  Walking or climbing stairs? Y  Dressing or  bathing? N  Doing errands, shopping? N  Some recent data might be hidden    Fall Risk Assessment Fall Risk  05/29/2017 12/25/2015 11/30/2014  Falls in the past year? No No No  Risk for fall due to : - - Impaired vision;Impaired balance/gait     Depression Screen PHQ 2/9 Scores 05/29/2017 12/25/2015 11/30/2014  PHQ - 2 Score 1 0 1  PHQ- 9 Score 7 - -    Cognitive Testing - 6-CIT  Correct? Score   What year is it? yes 0 0 or 4  What month is it? yes 0 0 or 3  Memorize:    [name],  [name],  44,  [address],  [place],      What time is it? (within 1 hour) yes 0 0 or 3  Count backwards from 20 yes 0 0, 2, or  4  Name the months of the year yes 0 0, 2, or 4  Repeat name & address above yes 3 0, 2, 4, 6, 8, or 10       TOTAL SCORE  3/28   Interpretation:  Normal  Normal (0-7) Abnormal (8-28)    Audit-C Alcohol Use Screening  Question Answer Points  How often do you have alcoholic drink? never 0  On days you do drink alcohol, how many drinks do you typically consume? 0 0  How oftey will you drink 6 or more in a total? never 0  Total Score:  0   A score of 3 or more in women, and 4 or more in men indicates increased risk for alcohol abuse, EXCEPT if all of the points are from question 1.     Assessment & Plan:     Annual Wellness Visit  Reviewed patient's Family Medical History Reviewed and updated list of patient's medical providers Assessment of cognitive impairment was done Assessed patient's functional ability Established a written schedule for health screening services Health Risk Assessent Completed and Reviewed  Exercise Activities and Dietary recommendations Goals    None      Immunization History  Administered Date(s) Administered  . Influenza, High Dose Seasonal PF 03/31/2013, 04/26/2015, 05/16/2016  . Influenza,inj,Quad PF,6+ Mos 06/12/2014  . Pneumococcal Conjugate-13 04/26/2015  . Pneumococcal Polysaccharide-23 01/22/2011    Health Maintenance  Topic  Date Due  . TETANUS/TDAP  11/12/1950  . DEXA SCAN  11/11/1996  . INFLUENZA VACCINE  01/28/2017  . PNA vac Low Risk Adult  Completed     Discussed health benefits of physical activity, and encouraged her to engage in regular exercise appropriate for her age and condition.    ------------------------------------------------------------------------------------------------------------  1. Medicare annual wellness visit, subsequent   2. Need for influenza vaccination  - Flu vaccine HIGH DOSE PF  3. Chronic obstructive pulmonary disease, unspecified COPD type (HCC) Better with Trelegy. Advised she does not need to take Brovana with this. Try taking at night to see if nausea improves. Continue docusate for constipation.   4. Insomnia, unspecified type   5. Right hip pain  - DG HIP UNILAT W OR W/O PELVIS 2-3 VIEWS RIGHT; Future  6. Estrogen deficiency  - DG Bone Density; Future  7. Hyperglycemia Results for orders placed or performed in visit on 05/29/17  POCT glycosylated hemoglobin (Hb A1C)  Result Value Ref Range   Hemoglobin A1C 5.7    Est. average glucose Bld gHb Est-mCnc 117     - Hemoglobin A1c - POCT glycosylated hemoglobin (Hb A1C)   Mila Merryonald Hesper Venturella, MD  [facility]

## 2017-06-05 DIAGNOSIS — J9611 Chronic respiratory failure with hypoxia: Secondary | ICD-10-CM | POA: Diagnosis not present

## 2017-06-05 DIAGNOSIS — J439 Emphysema, unspecified: Secondary | ICD-10-CM | POA: Diagnosis not present

## 2017-06-13 DIAGNOSIS — J9611 Chronic respiratory failure with hypoxia: Secondary | ICD-10-CM | POA: Diagnosis not present

## 2017-06-13 DIAGNOSIS — J439 Emphysema, unspecified: Secondary | ICD-10-CM | POA: Diagnosis not present

## 2017-06-15 DIAGNOSIS — H401133 Primary open-angle glaucoma, bilateral, severe stage: Secondary | ICD-10-CM | POA: Diagnosis not present

## 2017-07-01 ENCOUNTER — Ambulatory Visit
Admission: RE | Admit: 2017-07-01 | Discharge: 2017-07-01 | Disposition: A | Discharge status: Home / Self Care | Payer: Medicare Other | Source: Ambulatory Visit | Attending: Family Medicine | Admitting: Family Medicine

## 2017-07-01 DIAGNOSIS — M160 Bilateral primary osteoarthritis of hip: Secondary | ICD-10-CM | POA: Diagnosis not present

## 2017-07-01 DIAGNOSIS — M25551 Pain in right hip: Secondary | ICD-10-CM | POA: Diagnosis not present

## 2017-07-01 DIAGNOSIS — M25451 Effusion, right hip: Secondary | ICD-10-CM | POA: Diagnosis not present

## 2017-07-06 DIAGNOSIS — J439 Emphysema, unspecified: Secondary | ICD-10-CM | POA: Diagnosis not present

## 2017-07-06 DIAGNOSIS — J9611 Chronic respiratory failure with hypoxia: Secondary | ICD-10-CM | POA: Diagnosis not present

## 2017-07-09 ENCOUNTER — Other Ambulatory Visit: Payer: Medicare Other

## 2017-07-14 DIAGNOSIS — J439 Emphysema, unspecified: Secondary | ICD-10-CM | POA: Diagnosis not present

## 2017-07-14 DIAGNOSIS — J9611 Chronic respiratory failure with hypoxia: Secondary | ICD-10-CM | POA: Diagnosis not present

## 2017-08-06 DIAGNOSIS — J439 Emphysema, unspecified: Secondary | ICD-10-CM | POA: Diagnosis not present

## 2017-08-06 DIAGNOSIS — J9611 Chronic respiratory failure with hypoxia: Secondary | ICD-10-CM | POA: Diagnosis not present

## 2017-08-14 DIAGNOSIS — J439 Emphysema, unspecified: Secondary | ICD-10-CM | POA: Diagnosis not present

## 2017-08-14 DIAGNOSIS — J9611 Chronic respiratory failure with hypoxia: Secondary | ICD-10-CM | POA: Diagnosis not present

## 2017-08-24 ENCOUNTER — Telehealth: Payer: Self-pay | Admitting: *Deleted

## 2017-08-24 NOTE — Telephone Encounter (Signed)
Patient called [facility]. Patient states she is no longer able to keep her house up. It has become hard for her to afford her heating bill. Patient stated she would like to sale her house and move into a facility.

## 2017-08-25 NOTE — Telephone Encounter (Signed)
She had already spoken to [facility] and [facility].  [facility] if she can meet their admission requirements.  Encouraged her to make appt with manager to discuss.  She has no social support per self report and will need assist with move when ready.  She is [facility] eligible so may make referral if [facility] not an option.

## 2017-08-25 NOTE — Telephone Encounter (Signed)
Will contact patient and discuss concerns!

## 2017-08-25 NOTE — Telephone Encounter (Signed)
Noted, thank you. FYI to DunmoreMichelle..Marland Kitchen

## 2017-09-03 DIAGNOSIS — J439 Emphysema, unspecified: Secondary | ICD-10-CM | POA: Diagnosis not present

## 2017-09-03 DIAGNOSIS — J9611 Chronic respiratory failure with hypoxia: Secondary | ICD-10-CM | POA: Diagnosis not present

## 2017-09-11 DIAGNOSIS — J439 Emphysema, unspecified: Secondary | ICD-10-CM | POA: Diagnosis not present

## 2017-09-11 DIAGNOSIS — J9611 Chronic respiratory failure with hypoxia: Secondary | ICD-10-CM | POA: Diagnosis not present

## 2017-09-24 ENCOUNTER — Other Ambulatory Visit: Payer: Self-pay | Admitting: *Deleted

## 2017-09-24 NOTE — Patient Outreach (Signed)
[facility]) Care Management  09/24/2017  [name] [name] Jul 28, 1929 [id number]  Telephone screening call  Referral date : 09/21/17 Referral source: PCP , Dr.Fisher Referral reason : Assessment of care needs, transportation, patient interest in moving to place to provide assistance or having more assistance in home. Insurance : [facility]   Placed call to patient, introduced self ,  "patient states I don't know you",  discussed [facility]  from PCP office, she states okay, again attempted to verify HIPAA identifiers.  Patient request to return call to me later today, she took down my telephone contact information and states she will call later today. Patient states she did not sleep well on last night and not feeling well on today, offered if any way I can assist her she states she is just going to rest a while,.   Plan Will await return call from patient, if no response on today will plan return call on the next day.    [name] GaribaldiKimberly Argel Pablo, RN, [facility] Care Management,Care Management Coordinator  [phone]- Mobile [phone]- Toll Free Main Office

## 2017-09-25 ENCOUNTER — Other Ambulatory Visit: Payer: Self-pay | Admitting: *Deleted

## 2017-09-25 NOTE — Patient Outreach (Signed)
[facility]) Care Management  09/25/2017  Alma Downssther M Fulp [birth year] [id number]   Telephone screening call #2  Referral date : 09/21/17 Referral source: PCP , Dr.Fisher Referral reason : Assessment of care needs, transportation, patient interest in moving to place to provide assistance or having more assistance in home. Insurance : [facility]  Successful outreach call to patient, explained purpose of call ,HIPAA information verified. Patient discussed she is feeling better than when she spoke with me on yesterday, shared that she had a little upset stomach.   Social Patient lives at home alone, discussed that taking care of her home is more than she can handle now, states she is getting too old, not able to do what needs to be done to keep her home clean. Patient states she lives in a duplex, she has a cleaning lady that comes once a month but she is unsure how much longer she will be able to afford that. Patient discussed she has checked and she can't afford assisted living , she just wants a little smaller place, she is used to staying by herself.  Patient does her own cooking, shopping , she does still drive, goes to [employer] once a month otherwise she just goes to [employer]. Patient does not use a cane or walker . Patient reports he husband has been dead for 29 years and she has pretty isolated herself. She does go the the [employer] monthly to get large print books. Patient is hard of hearing has hearing aid but was having difficulty hearing on the phone today, states she needs to get a new phone and been to [employer] to check on .  Patient does not have a medical alert system and is not interested in .  Patient discussed her biggest problem is she cannot thread a needle, due to vision as sewing was her hobby.    Conditions Patient discussed having COPD /Emphysema for many years and it is under good control. She has home oxygen that she wears at 2 liters . Patient  states if she gets short of breath she just stops and rest first and then uses her Proair inhaler as needed and if that doesn't help she will let the doctor know, when asked about the Zone information for COPD states she is not familiar with the zones in relation to COPD management but she know what to do.  Patient reports having Glaucoma that she takes eye drops for daily.  Patient reports history of arthritis in her right  hip .   Medication concerns  Reports taking medications as prescribed, denies cost concern. Patient has Proair rescue inhaler that she keeps with her when out, reports occasionally has to use when out shopping. Reports she takes Trelegy inhaler daily and that is working well for her.    Consent Explained and offered [facility] care [facility] services, patient is in agreement for Social worker consult for assessment of senior living resources available. Discussed RN care coordinator follow up for education and support of COPD,patient declines stating she has been doing good and knows what to do, also discussed telephonic program  for management of COPD she declines need  at this time.  Plan  Provided patient with [facility] main contact information for future needs.  Will make patient case active.  Will place LCSW referral.    Egbert GaribaldiKimberly Jaiceon Collister, RN, [facility] Care Management,Care Management Coordinator  [phone]- Mobile [phone]- Toll Free Main Office

## 2017-09-30 ENCOUNTER — Other Ambulatory Visit: Payer: Self-pay | Admitting: *Deleted

## 2017-09-30 NOTE — Patient Outreach (Signed)
[facility]) Care Management  09/30/2017  [name] [name] Oct 18, 1929 [id number]   [facility] social [facility] to assist with housing resources. Per patient, she currently lives alone in a duplex and is no longer able to "keep it up" anymore. Patient states that she cannot afford assisted living and would like resources related to independent senior living.Patient states that she has very limited social support and has no family to assist with her housing needs. Per patient, she does have someone to help with the housekeeping once a month but is unsure how much longer she will be able to afford this expense. Patient had a difficult time hearing over the phone and needed to be called from a [name] [name] line for more clarity.Patient welcomes call from social work to assist with housing resources.  Plan: Patient to be transferred to [facility] social worker [name] NgoKendra [name] to assist with housing resources.  [name] ReamsChrystal Deyonte Cadden, LCSW [facility] Care Management [phone]

## 2017-10-01 ENCOUNTER — Other Ambulatory Visit: Payer: Self-pay

## 2017-10-01 NOTE — Patient Outreach (Signed)
10/01/2017  [name] [name] [birth year] MRN: [id number]  Attempted to contact patient on this date to discuss housing needs. Patient did not answer, left voicemail requesting a return call. Will attempt to contact patient within the next week.  [name], BSW, CDP Social Worker [facility] Mailing Address: [address] [place], [place] [address] Physical Address: [address], [place], [place] [address] Toll Free Main # [phone] Fax # [phone] Cell # [phone] Fax # [phone] [email or web address]      Humana  Discrimination is Against the [employer]. and its subsidiaries comply with applicable Federal civil rights laws and do not discriminate on the basis of race, color, national origin, age, disability, or sex. [employer]. and its subsidiaries do not exclude people or treat them differently because of race, color, national origin, age, disability, or sex.    [employer]. and its subsidiaries provide:  . Free auxiliary aids and services, such as qualified sign language interpreters, video remote interpretation, and written information in other formats to people with disabilities when such auxiliary aids and services are necessary to ensure an equal opportunity to participate. . Free language services to people whose primary language is not English when those services are necessary to provide meaningful access, such as translated documents or oral interpretation.    If you need these services, call [phone] or if you use a TTY, call 711.   If you believe that [employer]. and its subsidiaries have failed to provide these services or discriminated in another way on the basis of race, color, national origin, age, disability, or sex, you can file a Theatre manager with:   Discrimination Grievances  [address] [address]  [place], [place] [address]   If you need help filing a grievance, call [phone] or if you use a  TTY, call 711.   You can also file a civil rights complaint with the U.S. Department of Health and Health and safety inspector, Office for Civil Rights electronically through the Office for Civil Rights Complaint Portal, available at [email or web address], or by mail or phone at:   U.S. Department of Health and Human Services  [address], [place]  Room [phone], [facility] Building  [place], [place]. [address]  [phone], [phone] (TDD)  Complaint forms are available at [email or web address] [facility] Multi-Language Interpreter Services  English: ATTENTION: If you do not speak English, language assistance services, free of charge, are available to you. Call [phone] (TTY: 711).  Espaol (Spanish): ATENCIN: si habla espaol, tiene a su disposicin servicios gratuitos de asistencia lingstica. Llame al [phone]     (TTY: 711). ???? (Chinese): ?????????????????????????????? [phone]?TTY: 711??  Ti?ng Vi?t (Vietnamese): CH : N?u b?n ni Ti?ng Vi?t, c cc d?ch v? h? tr? ngn ng? mi?n ph dnh cho b?n. G?i s? [phone]                                                                                         (TTY: 711).  ??? ([place]): ?? : ???? ????? ?? , ?? ?? ???? ??? ???? ? ???? . [phone] (TTY: 711)??? ??? ???? .  Tagalog (Tagalog - Filipino): PAUNAWA: Kung nagsasalita ka  ng Ardelle Leschesagalog, maaari kang gumamit ng mga serbisyo ng tulong sa wika nang walang bayad. [place] sa [phone]                                                                                                                       (TTY: 711).  ??????? Azerbaijan(Russian): ????????: ???? ?? ???????? ?? ??????? ?????, ?? ??? ???????? ?????????? ?????? ????????. ??????? [phone]                                                                                                      (????????: 711).  Kreyl Ayisyen (JerseyFrench Creole): ATANSYON: Si w pale Abe PeopleKreyl Ayisyen, gen  svis d pou lang ki disponib gratis pou ou. Rele [phone]           (TTY: 711).  Cherylann BanasFranais Marland Kitchen(JamaicaFrench): ATTENTION : Si vous parlez franais, des services d'aide linguistique vous sont proposs gratuitement. Appelez le [phone]                                                                                              (ATS : 711).  Polski (EstoniaPolish): UWAGA: Je?eli mwisz po polsku, mo?esz skorzysta? z bezp?atnej pomocy j?zykowej. Zadzwo? pod numer [phone]       (TTY: 711).  Portugus (TongaPortuguese): ATENO: Se fala portugus, encontram-se disponveis servios lingusticos, grtis. Ligue para [phone]      (TTY: 711).  Italiano ([place]): ATTENZIONE: In caso la lingua parlata sia l'italiano, sono disponibili servizi di assistenza linguistica gratuiti. Chiamare il numero [phone]                                                                                                                            (  TTY: 845).  Dawayne Patricia (Korea): ACHTUNG: Wenn Sie Deutsch sprechen, stehen Ihnen kostenlos sprachliche Hilfsdienstleistungen zur [employer]. Rufnum[place]: [phone]                                                                               (TTY: 482).   (Arabic): [phone]   .            : .)169 :   (  ??? ([place]): ??????????????????????????????????[phone] ?TTY?711?????????????????  ? (Farsi): [phone]  . ?   ? ?  ? ? ?~ ?  ?    : .??  (TTY: 711)  Din Bizaad (Navajo): D77 baa ak0 n7n7zin: D77 saad bee y1n7[ti'go Risa Grill, saad bee 1k1'1n7da'1wo'd66', t'11 Pricilla Loveless n1 h0l=, koj8' h0d77lnih [phone]            (TTY:   748).

## 2017-10-04 DIAGNOSIS — J439 Emphysema, unspecified: Secondary | ICD-10-CM | POA: Diagnosis not present

## 2017-10-04 DIAGNOSIS — J9611 Chronic respiratory failure with hypoxia: Secondary | ICD-10-CM | POA: Diagnosis not present

## 2017-10-06 ENCOUNTER — Other Ambulatory Visit: Payer: Self-pay

## 2017-10-06 NOTE — Patient Outreach (Signed)
[name] [name] DOB [birth year] MRN: [id number]  CSW made second outreach attempt to contact patient regarding referral for community resources. Second outreach attempt was successful. Patient able to state name, date of birth, and address.   Patient expressed concern that she is no longer able to "keep up" with household chores in her current living situation. Patient expressed to CSW that she wants to live somewhere for older adults that she can still have company over and it "feel like home". Patient concerned there may not be an option available.  CSW spoke with patient in length about available options. CSW explained to patient the difference in independent and assisted living. Patient states she wants independent living. Patient stated she has around $2,000 she can spend each month for placement. Patient prefers to stay [facility] to [facility].  CSW to contact independent living communities in [place] to obtain information about services as well as bed availability. CSW plans to contact patient on Thursday 10/08/17 to discuss findings.   Patient states she has no other community resource needs at this time.  [name] [name], BSW, CDP Social Worker [facility] Mailing Address: [address], [place], [place] [address] Physical Address: [address], [place], [place] [address] Toll Free Main # [phone] Fax # [phone] Cell # [phone] Fax # [phone] [name]      Humana  Discrimination is Against the [employer]. and its subsidiaries comply with applicable Federal civil rights laws and do not discriminate on the basis of race, color, national origin, age, disability, or sex. [employer]. and its subsidiaries do not exclude people or treat them differently because of race, color, national origin, age, disability, or sex.    [employer]. and its subsidiaries provide:  . Free auxiliary aids and services, such as  qualified sign language interpreters, video remote interpretation, and written information in other formats to people with disabilities when such auxiliary aids and services are necessary to ensure an equal opportunity to participate. . Free language services to people whose primary language is not English when those services are necessary to provide meaningful access, such as translated documents or oral interpretation.    If you need these services, call [phone] or if you use a TTY, call 711.   If you believe that [employer]. and its subsidiaries have failed to provide these services or discriminated in another way on the basis of race, color, national origin, age, disability, or sex, you can file a Theatre manager with:   Discrimination Grievances  [address] [address]  [place], [place] [address]   If you need help filing a grievance, call [phone] or if you use a TTY, call 711.   You can also file a civil rights complaint with the U.S. Department of Health and Health and safety inspector, Office for Civil Rights electronically through the Office for Civil Rights Complaint Portal, available at [email or web address], or by mail or phone at:   U.S. Department of Health and Human Services  [address], [place]  Room [phone], [facility] Building  [place], [place]. [address]  [phone], [phone] (TDD)  Complaint forms are available at [email or web address] [facility] Multi-Language Interpreter Services  English: ATTENTION: If you do not speak English, language assistance services, free of charge, are available to you. Call [phone] (TTY: 711).  Espaol (Spanish): ATENCIN: si habla espaol, tiene a su disposicin servicios gratuitos de asistencia lingstica. Llame al [phone]     (TTY: 711). ???? (Chinese): ?????????????????????????????? [phone]?TTY: 711??  Ti?ng Vi?t (Vietnamese): CH :  N?u b?n ni Ti?ng Vi?t, c cc d?ch v? h? tr? ngn  ng? mi?n ph dnh cho b?n. G?i s? [phone]                                                                                         (TTY: 711).  ??? (Bermuda[place]orean): ?? : ???? ????? ?? , ?? ?? ???? ??? ???? ? ???? . [phone] (TTY: 711)??? ??? ???? .  Tagalog (Tagalog - Filipino): PAUNAWA: Kung nagsasalita ka ng Tagalog, maaari kang gumamit ng mga serbisyo ng tulong sa wika nang walang bayad. [place] sa [phone]                                                                                                                       (TTY: 711).  ??????? Azerba[place](Russian): ????????: ???? ?? ???????? ?? ??????? ?????, ?? ??? ???????? ?????????? ?????? ????????. ??????? [phone]                                                                                                      (????????: 711).  Kreyl Ayisyen (JerseyFrench Creole): ATANSYON: Si w pale Abe PeopleKreyl Ayisyen, gen svis d pou lang ki disponib gratis pou ou. Rele [phone]           (TTY: 711).  [name] BanasFranais Marland Kitchen(Jama[place]French): ATTENTION : Si vous parlez franais, des services d'aide linguistique vous sont proposs gratuitement. Appelez le [phone]                                                                                              (ATS : 711).  Polski (EstoniaPolish): UWAGA: Je?eli mwisz po polsku, mo?esz skorzysta? z bezp?atnej pomocy j?zykowej. Zadzwo? pod numer [phone]       (TTY: 711).  Portugus (TongaPortuguese): ATENO: Se fala portugus, encontram-se disponveis servios lingusticos, grtis. Ligue para [phone]      (  TTY: 711).  Italiano ([place]): ATTENZIONE: In caso la lingua parlata sia l'italiano, sono disponibili servizi di assistenza linguistica gratuiti. Chiamare il numero [phone]                                                                                                                            (TTY: 711).  [name] [name] ([place]rones[place]): ACHTUNG: Wenn Sie Deutsch sprechen, stehen Ihnen kostenlos sprachliche  Hilfsdienstleistungen zur [employer]. Rufnummer: [phone]                                                                               (TTY: 711).   (Arabic): [phone]   .            : .)711 :   (  ??? (Japanese): ??????????????????????????????????[phone] ?TTY?711?????????????????  ? (Farsi): [phone]  . ?   ? ?  ? ? ?~ ?  ?    : .??  (TTY: 711)  Din Bizaad (Navajo): D77 baa ak0 n7n7zin: D77 saad bee y1n7[ti'go Jodell Cipro, saad bee 1k1'1n7da'1wo'd66', t'11 Donnetta Hail n1 h0l=, koj8' h0d77lnih [phone]            (TTY:   711).

## 2017-10-08 ENCOUNTER — Ambulatory Visit: Payer: Self-pay

## 2017-10-08 ENCOUNTER — Other Ambulatory Visit: Payer: Self-pay

## 2017-10-08 NOTE — Patient Outreach (Signed)
[facility]) Care Management  10/08/2017  [name] [name] [birth year] [id number]  CSW contacted patient to discuss independent living facilities in [place]. HIPAA identifiers verified. CSW spoke with patient about two different options. Patient identified being interested in one option, [facility].   CSW gave patient contact information for [facility] to allow patient to call for more information. CSW also printed information from [facility]'s website and placed in mail for patient to review at home.  CSW to call patient in the next two weeks to verify patient received information via mail.  Bevelyn NgoKendra Desi [name], BSW, CDP Social Worker [facility] [place] [facility] Mailing Address: [address], [place], [place] [address] Physical Address: [address], [place], [place] [address] Toll Free Main # [phone] Fax # [phone] Cell # [phone] Fax # [phone] [email or web address]

## 2017-10-12 DIAGNOSIS — J439 Emphysema, unspecified: Secondary | ICD-10-CM | POA: Diagnosis not present

## 2017-10-12 DIAGNOSIS — J9611 Chronic respiratory failure with hypoxia: Secondary | ICD-10-CM | POA: Diagnosis not present

## 2017-10-15 DIAGNOSIS — H401113 Primary open-angle glaucoma, right eye, severe stage: Secondary | ICD-10-CM | POA: Diagnosis not present

## 2017-10-22 ENCOUNTER — Other Ambulatory Visit: Payer: Self-pay

## 2017-10-22 ENCOUNTER — Ambulatory Visit: Payer: Self-pay

## 2017-10-22 NOTE — Patient Outreach (Signed)
[facility]N) Care Management  10/22/2017  [name] [name] 07/24/1929 [id number]  BSW contacted patient today to follow up on her plans to move into independent living. HIPAA identifiers verified. Patient confirmed she received the information BSW mailed about [facility].  Patient has also been in contact with [facility] and recently toured the community. During the tour, patient was informed there would be an apartment coming available. Patient indicated she has left a deposit and plans to move in on November 28 2017.  Patient is very happy during today's call. Patient stated "I love this place!". Patient informed BSW how nice the staff and community setting is. Patient plans to move into a 1 bedroom apartment.  Patient has expressed feeling overwhelmed but excited about this transition. Patient has made an appointment with her PCP to obtain necessary paperwork for the transition into independent living. BSW has encouraged patient to begin making a list of items she wants to take with her to the independent living apartment. Patient states "that's a good idea. I will do that".  Patient has asked BSW to assist with finding companies to help her downsize and organize her move. BSW has contacted three separate companies in order to gain better knowledge of their services. These companies include: [employer]: [phone] [employer]: [phone] [employer]: [phone]  Plan: BSW to contact patient on 10/23/17 to discuss possible options to help in this transition.  Bevelyn NgoKendra Chaundra Abreu, BSW, CDP Social Worker Cell # [phone] [email or web address]

## 2017-10-23 ENCOUNTER — Ambulatory Visit: Payer: Self-pay

## 2017-10-23 ENCOUNTER — Other Ambulatory Visit: Payer: Self-pay

## 2017-10-23 NOTE — Patient Outreach (Signed)
[facility]N) Care Management  10/23/2017  [name] [name] October 10, 1929 [id number]   BSW contacted patient to discuss possible home organization/moving options. HIPAA identifiers verified. Patient indicated she was concerned with such high hourly rates given she needs quite a bit of help. Patient states she has spoken with two friends about her needs and they are "calling around" in attempts to find someone who can help.  BSW spoke with patient about how she was feeling with her plan to move. Patient stated  "I'm most anxious about making sure I didn't dream this!". Patient continues to be very happy about [facility].  After ending patient call, BSW contacted [facility] to inquire about volunteer services what may help patient. BSW spoke with Darel HongJudy who informed this BSW that patient could develop a flyer to post at the senior center. BSW notified patient of option.  BSW also contacted Lelon MastSamantha at [facility] to inquire if she was aware of any local services. Lelon MastSamantha provided BSW with contact information of a housekeeper [facility] contracts with.   Plan: BSW contacted patient to relay contact information of possible resource.  Bevelyn NgoKendra Shanedra Lave, BSW, CDP Social Worker Cell # [phone] [email or web address]

## 2017-10-27 ENCOUNTER — Ambulatory Visit (INDEPENDENT_AMBULATORY_CARE_PROVIDER_SITE_OTHER): Payer: Medicare Other | Admitting: Family Medicine

## 2017-10-27 ENCOUNTER — Encounter: Payer: Self-pay | Admitting: Family Medicine

## 2017-10-27 VITALS — BP 130/80 | HR 91 | Temp 98.1°F | Resp 16 | Ht 62.0 in | Wt 118.0 lb

## 2017-10-27 DIAGNOSIS — J449 Chronic obstructive pulmonary disease, unspecified: Secondary | ICD-10-CM

## 2017-10-27 DIAGNOSIS — Z022 Encounter for examination for admission to residential institution: Secondary | ICD-10-CM | POA: Diagnosis not present

## 2017-10-27 LAB — POCT URINALYSIS DIPSTICK
APPEARANCE: NORMAL
Bilirubin, UA: NEGATIVE
GLUCOSE UA: NEGATIVE
Ketones, UA: NEGATIVE
LEUKOCYTES UA: NEGATIVE
NITRITE UA: NEGATIVE
Odor: NORMAL
PH UA: 6 (ref 5.0–8.0)
PROTEIN UA: NEGATIVE
RBC UA: NEGATIVE
SPEC GRAV UA: 1.01 (ref 1.010–1.025)
Urobilinogen, UA: 0.2 E.U./dL

## 2017-10-27 MED ORDER — PRAVASTATIN SODIUM 40 MG PO TABS: 40.0000 mg | ORAL_TABLET | Freq: Every day | ORAL | 3 refills | Status: DC

## 2017-10-27 NOTE — Progress Notes (Signed)
[name] Female    DOB: Jan 03, 1930   84 y.o.   MRN: [id number] Visit Date: 10/27/2017  Today's Provider: Mila Merry, MD   No chief complaint on file.  Subjective:    HPI  Patient is here to get forms completed, to move into assisted living at [facility] with [facility]. She has no new complaints today. She was advised after labs last month to start pravastatin which she declined at that time. She continues to use Trelegy every day which remains effective and well tolerated. . Patient is tiring to get into [facility].   No results found for: CHOL, HDL, LDLCALC, LDLDIRECT, TRIG, CHOLHDL   Allergies  Allergen Reactions  . Shellfish Allergy   . Codeine     headache  . Prednisone     Altered Mental Status  . Tetracycline      Current Outpatient Medications:  .  albuterol (PROAIR HFA) 108 (90 Base) MCG/ACT inhaler, Inhale 2 puffs into the lungs every 6 (six) hours as needed for wheezing or shortness of breath., Disp: 3 Inhaler, Rfl: 2 .  aspirin 325 MG tablet, Take 325 mg by mouth every other day as needed., Disp: , Rfl:  .  cholecalciferol (VITAMIN D) 1000 UNITS tablet, Take 1,000 Units by mouth daily., Disp: , Rfl:  .  docusate sodium (COLACE) 50 MG capsule, Take 100 mg by mouth at bedtime., Disp: , Rfl:  .  dorzolamide (TRUSOPT) 2 % ophthalmic solution, Place 1 drop into both eyes 3 (three) times daily. , Disp: , Rfl:  .  Fluticasone-Umeclidin-Vilant (TRELEGY ELLIPTA) 100-62.5-25 MCG/INH AEPB, Inhale 1 puff into the lungs daily., Disp: 3 each, Rfl: 4 .  latanoprost (XALATAN) 0.005 % ophthalmic solution, Place 1 drop into both eyes at bedtime., Disp: , Rfl:  .  Multiple Vitamins-Minerals (CENTRUM SILVER ADULT 50+ PO), Take 1 tablet by mouth daily., Disp: , Rfl:   Review of Systems  Constitutional: Negative for appetite change, chills, fatigue and fever.  Respiratory: Negative for chest tightness and shortness of  breath.   Cardiovascular: Negative for chest pain and palpitations.  Gastrointestinal: Negative for abdominal pain, nausea and vomiting.  Neurological: Negative for dizziness and weakness.    Social History   Tobacco Use  . Smoking status: Former Smoker    Packs/day: 1.00    Years: 50.00    Pack years: 50.00    Types: Cigarettes    Last attempt to quit: 06/30/2000    Years since quitting: 17.3  . Smokeless tobacco: Never Used  Substance Use Topics  . Alcohol use: No   Objective:   BP 130/80 (BP Location: Right Arm, Patient Position: Sitting, Cuff Size: Normal)   Pulse 91   Temp 98.1 F (36.7 C) (Oral)   Resp 16   Ht  (1.575 m)   Wt 118 lb (53.5 kg)   SpO2 95%   BMI 21.58 kg/m  Vitals:   10/27/17 1540  BP: 130/80  Pulse: 91  Resp: 16  Temp: 98.1 F (36.7 C)  TempSrc: Oral  SpO2: 95%  Weight: 118 lb (53.5 kg)  Height:  (1.575 m)     Physical Exam   General Appearance:    Alert, cooperative, no distress  Eyes:    PERRL, conjunctiva/corneas clear, EOM's intact       Lungs:     Diminished breath sounds. Clear to auscultation bilaterally, respirations unlabored  Heart:  Regular rate and rhythm  Neurologic:   Awake, alert, oriented x 3. No apparent focal neurological           defect.           Assessment & Plan:     1. Chronic obstructive pulmonary disease, unspecified COPD type (HCC) Doing well on current regiment of inhalers.  - CBC - QuantiFERON-TB Gold Plus - POCT urinalysis dipstick She requests referral for different Oxygen company. She is having billing issues with [employer] and wasn't able to get concentrator from [place].   2. Encounter for examination for admission to residential institution  - CBC - QuantiFERON-TB Gold Plus - POCT urinalysis dipstick  Completed H&P for anticipated move to [facility] retirement community.    Over half of this 30 minute visit were spent in counseling and coordinating care of multiple medical  problems.        Mila Merry, MD  [facility]

## 2017-10-29 ENCOUNTER — Other Ambulatory Visit: Payer: Self-pay

## 2017-10-29 NOTE — Patient Outreach (Signed)
[facility]) Care Management  10/29/2017  [name] [name] [birth year] [id number]   BSW contacted patient to follow up on resources with moving assistance. HIPAA identifiers verified. Patient stated the resource provided by Lelon Mast is available to assist with organization and packing. This resource plans to begin helping patient on Saturday Oct 31 2017.   Patient states she has visited her primary care provider in an effort to complete forms needed for [facility]. Patient is eager to move in but acknowledges some fears. Patient states "It's going to be an adjustment" citing she will be leaving a large duplex to move into a one bedroom apartment. Patient went on to state "But I certainly don't need the space".   BSW spoke with patient about her plans over the next month to prepare for her move. Patient is currently making lists of what she wants to take with her. Patient has also begun sorting items she intends to donate to [facility].  Plan: BSW to contact patient in the next 3 weeks to check on status of move. BSW reminded patient of contact information in case patient wants to contact BSW sooner.  [name], BSW, CDP Social Worker Cell # [phone] [name]

## 2017-10-31 LAB — QUANTIFERON-TB GOLD PLUS
QUANTIFERON NIL VALUE: 0.04 [IU]/mL
QuantiFERON TB1 Ag Value: 0.04 IU/mL
QuantiFERON TB2 Ag Value: 0.03 IU/mL
QuantiFERON-TB Gold Plus: NEGATIVE

## 2017-10-31 LAB — CBC
Hematocrit: 40.6 % (ref 34.0–46.6)
Hemoglobin: 13.5 g/dL (ref 11.1–15.9)
MCH: 31.1 pg (ref 26.6–33.0)
MCHC: 33.3 g/dL (ref 31.5–35.7)
MCV: 94 fL (ref 79–97)
PLATELETS: 180 10*3/uL (ref 150–379)
RBC: 4.34 x10E6/uL (ref 3.77–5.28)
RDW: 14.2 % (ref 12.3–15.4)
WBC: 6.7 10*3/uL (ref 3.4–10.8)

## 2017-11-03 ENCOUNTER — Telehealth: Payer: Self-pay | Admitting: Family Medicine

## 2017-11-03 DIAGNOSIS — J9611 Chronic respiratory failure with hypoxia: Secondary | ICD-10-CM | POA: Diagnosis not present

## 2017-11-03 DIAGNOSIS — J439 Emphysema, unspecified: Secondary | ICD-10-CM | POA: Diagnosis not present

## 2017-11-03 DIAGNOSIS — H401123 Primary open-angle glaucoma, left eye, severe stage: Secondary | ICD-10-CM | POA: Diagnosis not present

## 2017-11-03 NOTE — Telephone Encounter (Signed)
Yes, they are completed.

## 2017-11-03 NOTE — Telephone Encounter (Signed)
Form faxed

## 2017-11-03 NOTE — Telephone Encounter (Signed)
Patient called office wanting to know if the form for the retirement facility has been completed. Please advise?

## 2017-11-03 NOTE — Telephone Encounter (Signed)
Left message on Kelly's vm to cb. We need her fax number.

## 2017-11-03 NOTE — Telephone Encounter (Signed)
Hector Brunswick with [facility] stated she was returning Michelle's call from last week. Please advise. Thanks TNP

## 2017-11-03 NOTE — Telephone Encounter (Signed)
Pt also called asking for this to be done.  teri

## 2017-11-09 ENCOUNTER — Encounter: Payer: Self-pay | Admitting: Family Medicine

## 2017-11-09 DIAGNOSIS — R6889 Other general symptoms and signs: Secondary | ICD-10-CM | POA: Insufficient documentation

## 2017-11-11 DIAGNOSIS — J9611 Chronic respiratory failure with hypoxia: Secondary | ICD-10-CM | POA: Diagnosis not present

## 2017-11-11 DIAGNOSIS — J439 Emphysema, unspecified: Secondary | ICD-10-CM | POA: Diagnosis not present

## 2017-11-19 ENCOUNTER — Other Ambulatory Visit: Payer: Self-pay

## 2017-11-19 ENCOUNTER — Ambulatory Visit: Payer: Self-pay

## 2017-11-19 NOTE — Patient Outreach (Signed)
[facility]) Care Management  11/19/2017  [name] [name] 05/08/1930 [id number]   BSW attempted to contact patient to discuss progress in preparation for move to independent living. HIPAA compliant voice message left requesting a return call. BSW to outreach patient in the next 4 business days.  [name], BSW, CDP Social Worker Cell # [phone] [email or web address]

## 2017-11-24 ENCOUNTER — Other Ambulatory Visit: Payer: Self-pay

## 2017-11-24 ENCOUNTER — Ambulatory Visit: Payer: Self-pay

## 2017-11-24 NOTE — Patient Outreach (Signed)
[facility]) Care Management  11/24/2017  [name] [name] [birth year] [id number]  BSW received a voice message from patient stating she continues to make plans for her move to independent living. Patient plans to physically move on June 3rd. BSW attempted to return patients call, outreach unsuccessful. BSW left patient a HIPAA compliant voice message. BSW will plan to close case in the next two weeks if no return call from patient or assistance with resources expressed by patient.   [name], Kenard Gower, CDP Social Worker [phone]

## 2017-12-02 ENCOUNTER — Other Ambulatory Visit: Payer: Self-pay

## 2017-12-02 NOTE — Patient Outreach (Signed)
[facility]) Care Management  12/02/2017  LEYNA VANDERKOLK 13-Apr-1930 [id number]  BSW contacted [facility] to confirm patients successful move. Aldona Bar confirmed patient is moved in and getting settled. Aldona Bar indicates patient is acclimating well and beginning to socialize with other residents. BSW to perform case closure given that patient has met goals.  Daneen Schick, Arita Miss, CDP Social Worker [phone]

## 2017-12-04 DIAGNOSIS — J9611 Chronic respiratory failure with hypoxia: Secondary | ICD-10-CM | POA: Diagnosis not present

## 2017-12-04 DIAGNOSIS — J439 Emphysema, unspecified: Secondary | ICD-10-CM | POA: Diagnosis not present

## 2017-12-12 DIAGNOSIS — J9611 Chronic respiratory failure with hypoxia: Secondary | ICD-10-CM | POA: Diagnosis not present

## 2017-12-12 DIAGNOSIS — J439 Emphysema, unspecified: Secondary | ICD-10-CM | POA: Diagnosis not present

## 2017-12-12 IMAGING — CT CT HEAD W/O CM
3 series · 15 of 46 positions shown, 18 images · non-contrast
Comparison: None.

CLINICAL DATA: Altered mental status. Concern for intracranial
hemorrhage.

EXAM:
CT HEAD WITHOUT CONTRAST
TECHNIQUE: Contiguous axial images were obtained from the base of the skull
through the vertex without intravenous contrast.

[Series 2: head wo · axial · 0.42mm/px · z∈[-124,-4]mm · 9 of 29 slices shown, 12 images]
[im 3/29  brain]
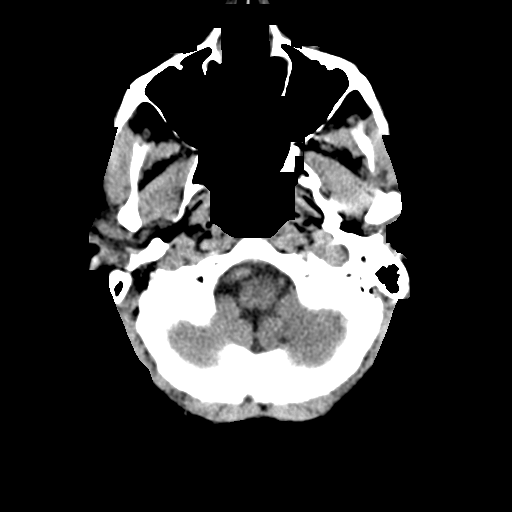
[im 3/29  bone]
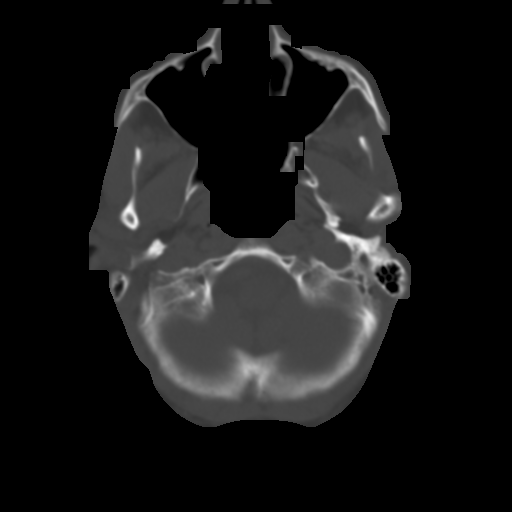
[im 6/29  brain]
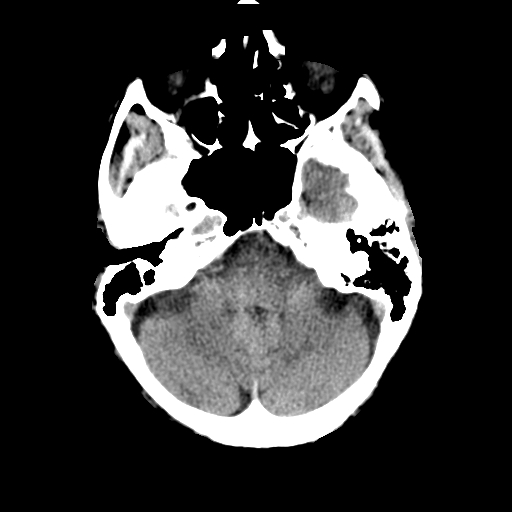
[im 9/29  brain]
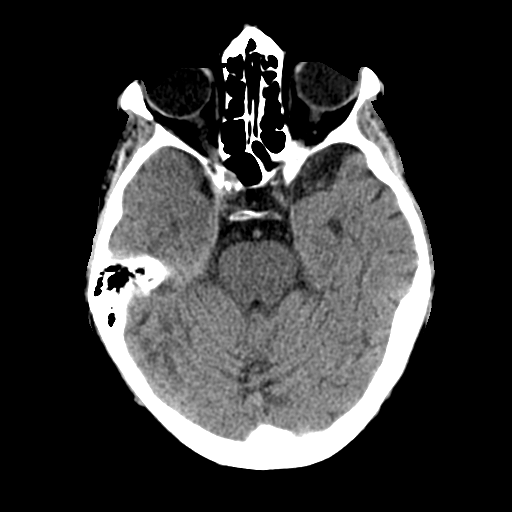
[im 12/29  brain]
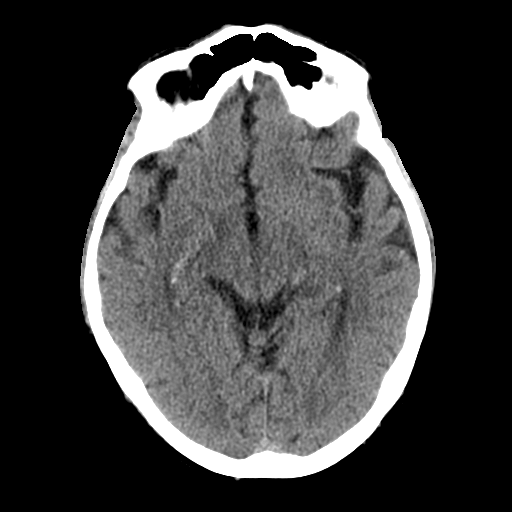
[im 15/29  brain]
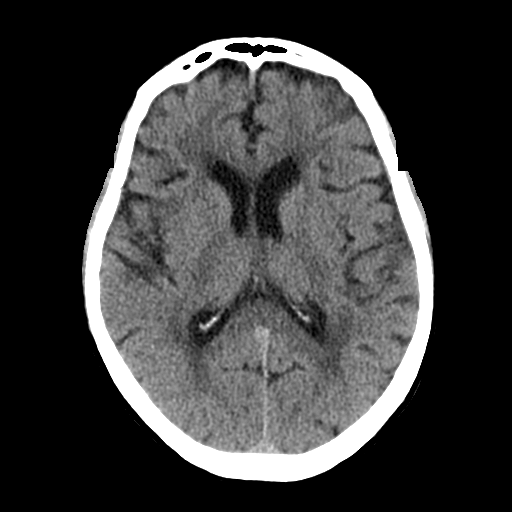
[im 15/29  bone]
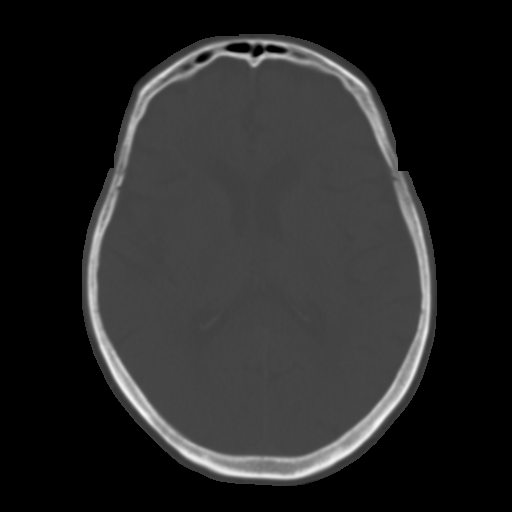
[im 18/29  brain]
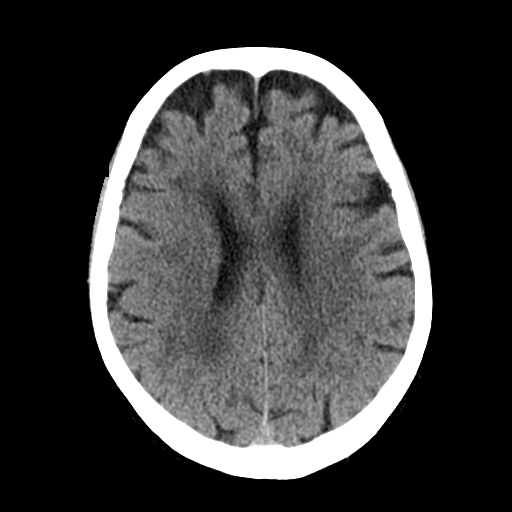
[im 21/29  brain]
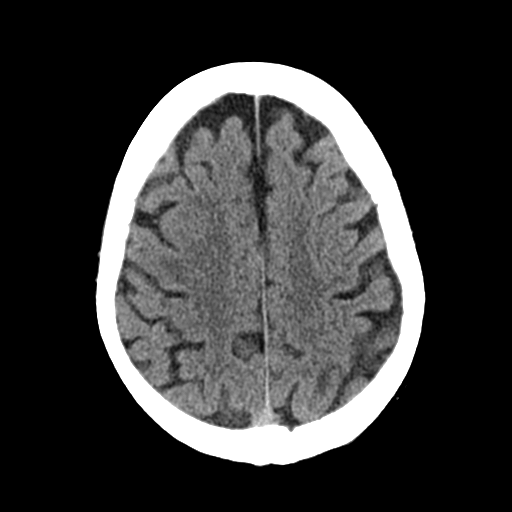
[im 24/29  brain]
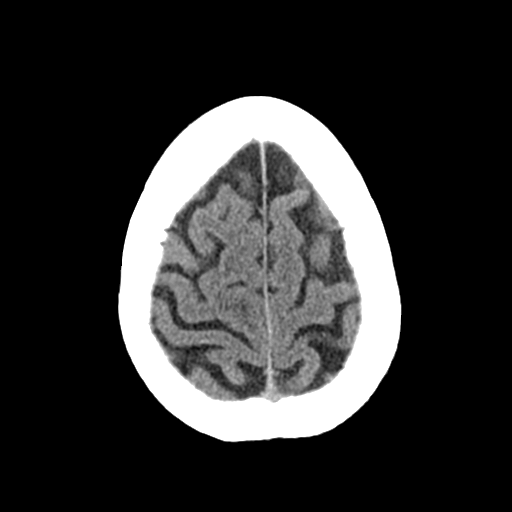
[im 27/29  brain]
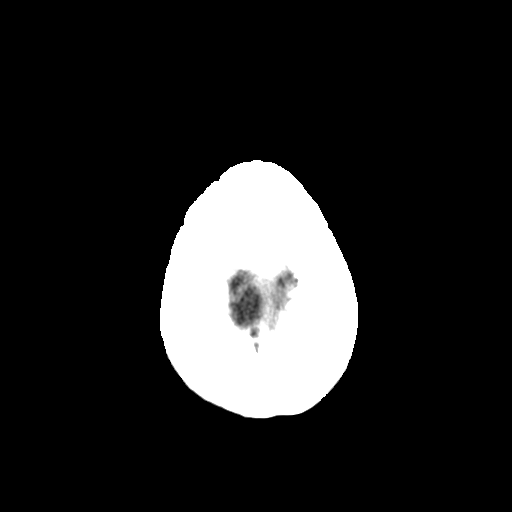
[im 27/29  bone]
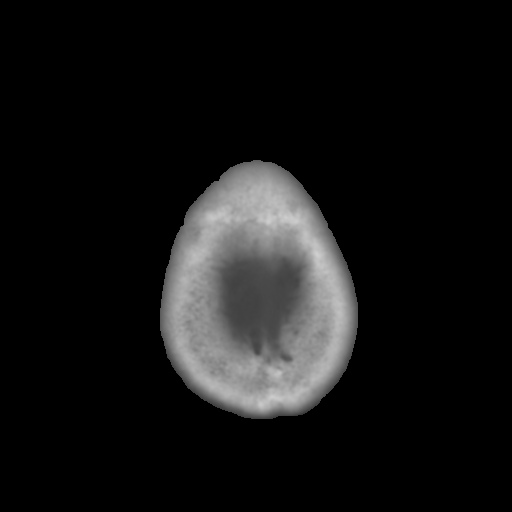

[Series 4: coronal soft tissue · coronal · 0.28mm/px · 3 of 66 slices shown]
[im 22/66  brain]
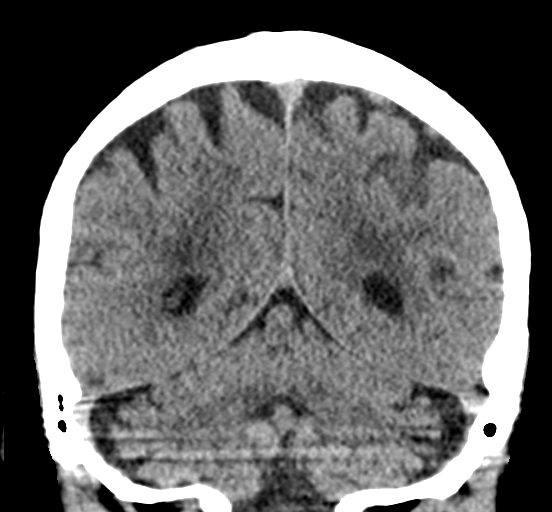
[im 29/66  brain]
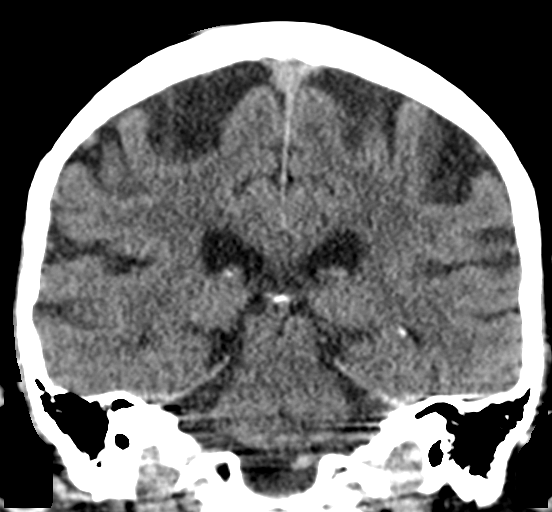
[im 37/66  brain]
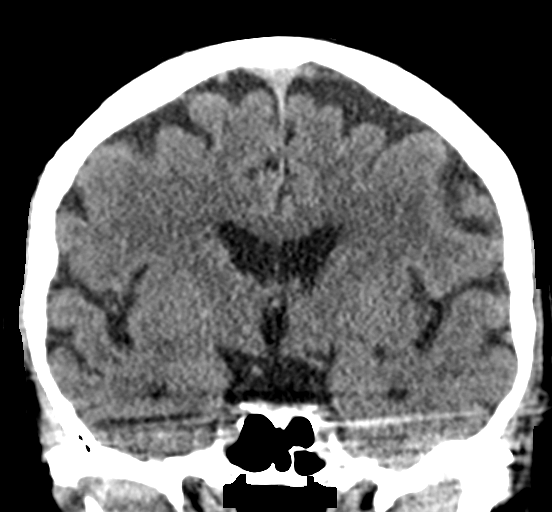

[Series 5: sagittal soft tissue · sagittal · 0.29mm/px · 3 of 52 slices shown]
[im 18/52  brain]
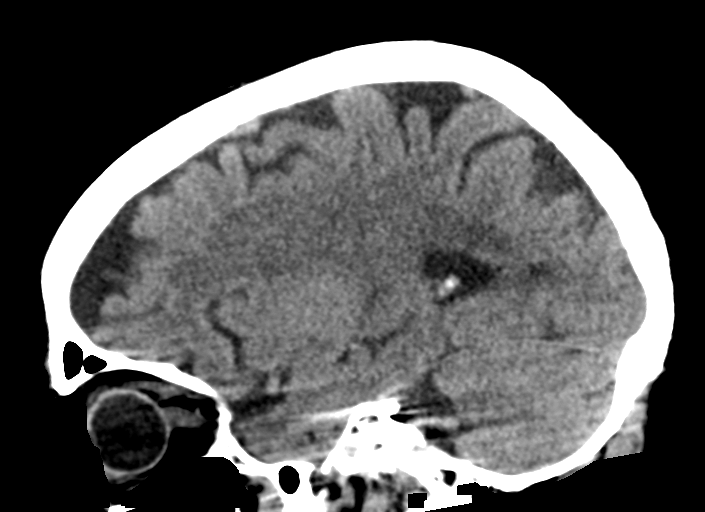
[im 26/52  brain]
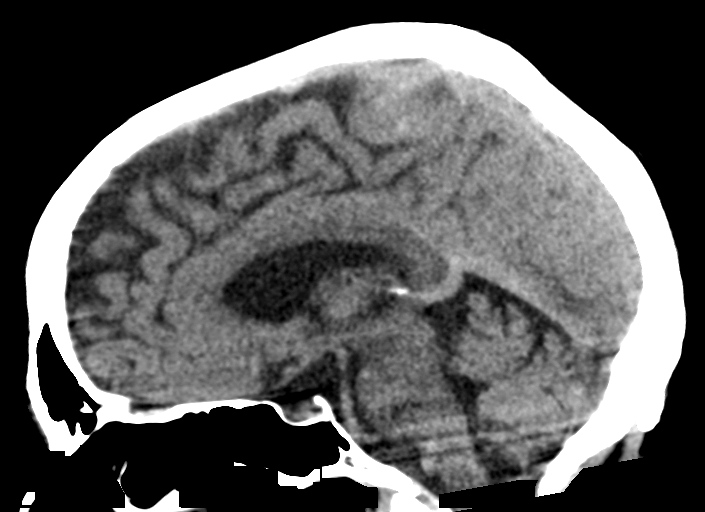
[im 35/52  brain]
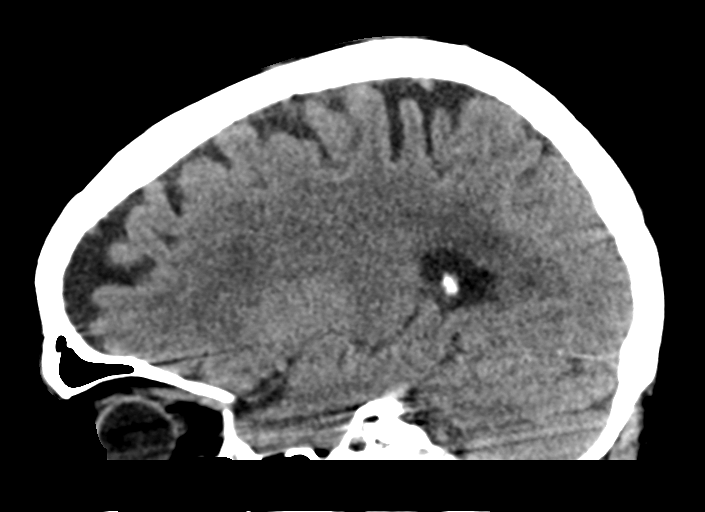

[15 of 46 positions shown; findings below may reference images not displayed]

FINDINGS: Brain: No definite evidence of acute infarction, hemorrhage,
hydrocephalus, extra-axial collection or mass lesion/mass effect.
There is advanced microangiopathy of the periventricular white
matter. Slightly asymmetric hypoattenuated appearance is seen in the
right subcortical frontal lobe. This may represent asymmetric
involvement with microangiopathy, however small age-indeterminate
infarct cannot be entirely excluded.

Vascular: No hyperdense vessel or unexpected calcification.

Skull: Normal. Negative for fracture or focal lesion.

Sinuses/Orbits: No acute finding.

Other: None.
IMPRESSION: Advance periventricular microangiopathy.

Un area of asymmetric hypoattenuation in the subcortical right
frontal lobe may represent asymmetric involvement with
microangiopathy versus age-indeterminate small infarct.

No evidence of intracranial hemorrhage.

## 2017-12-12 IMAGING — CR DG CHEST 1V
1 series · 1 of 1 positions shown · non-contrast
Comparison: 08/24/2014

CLINICAL DATA: Fall.  Altered mental status.

EXAM:
CHEST 1 VIEW

[dg chest 1 view]
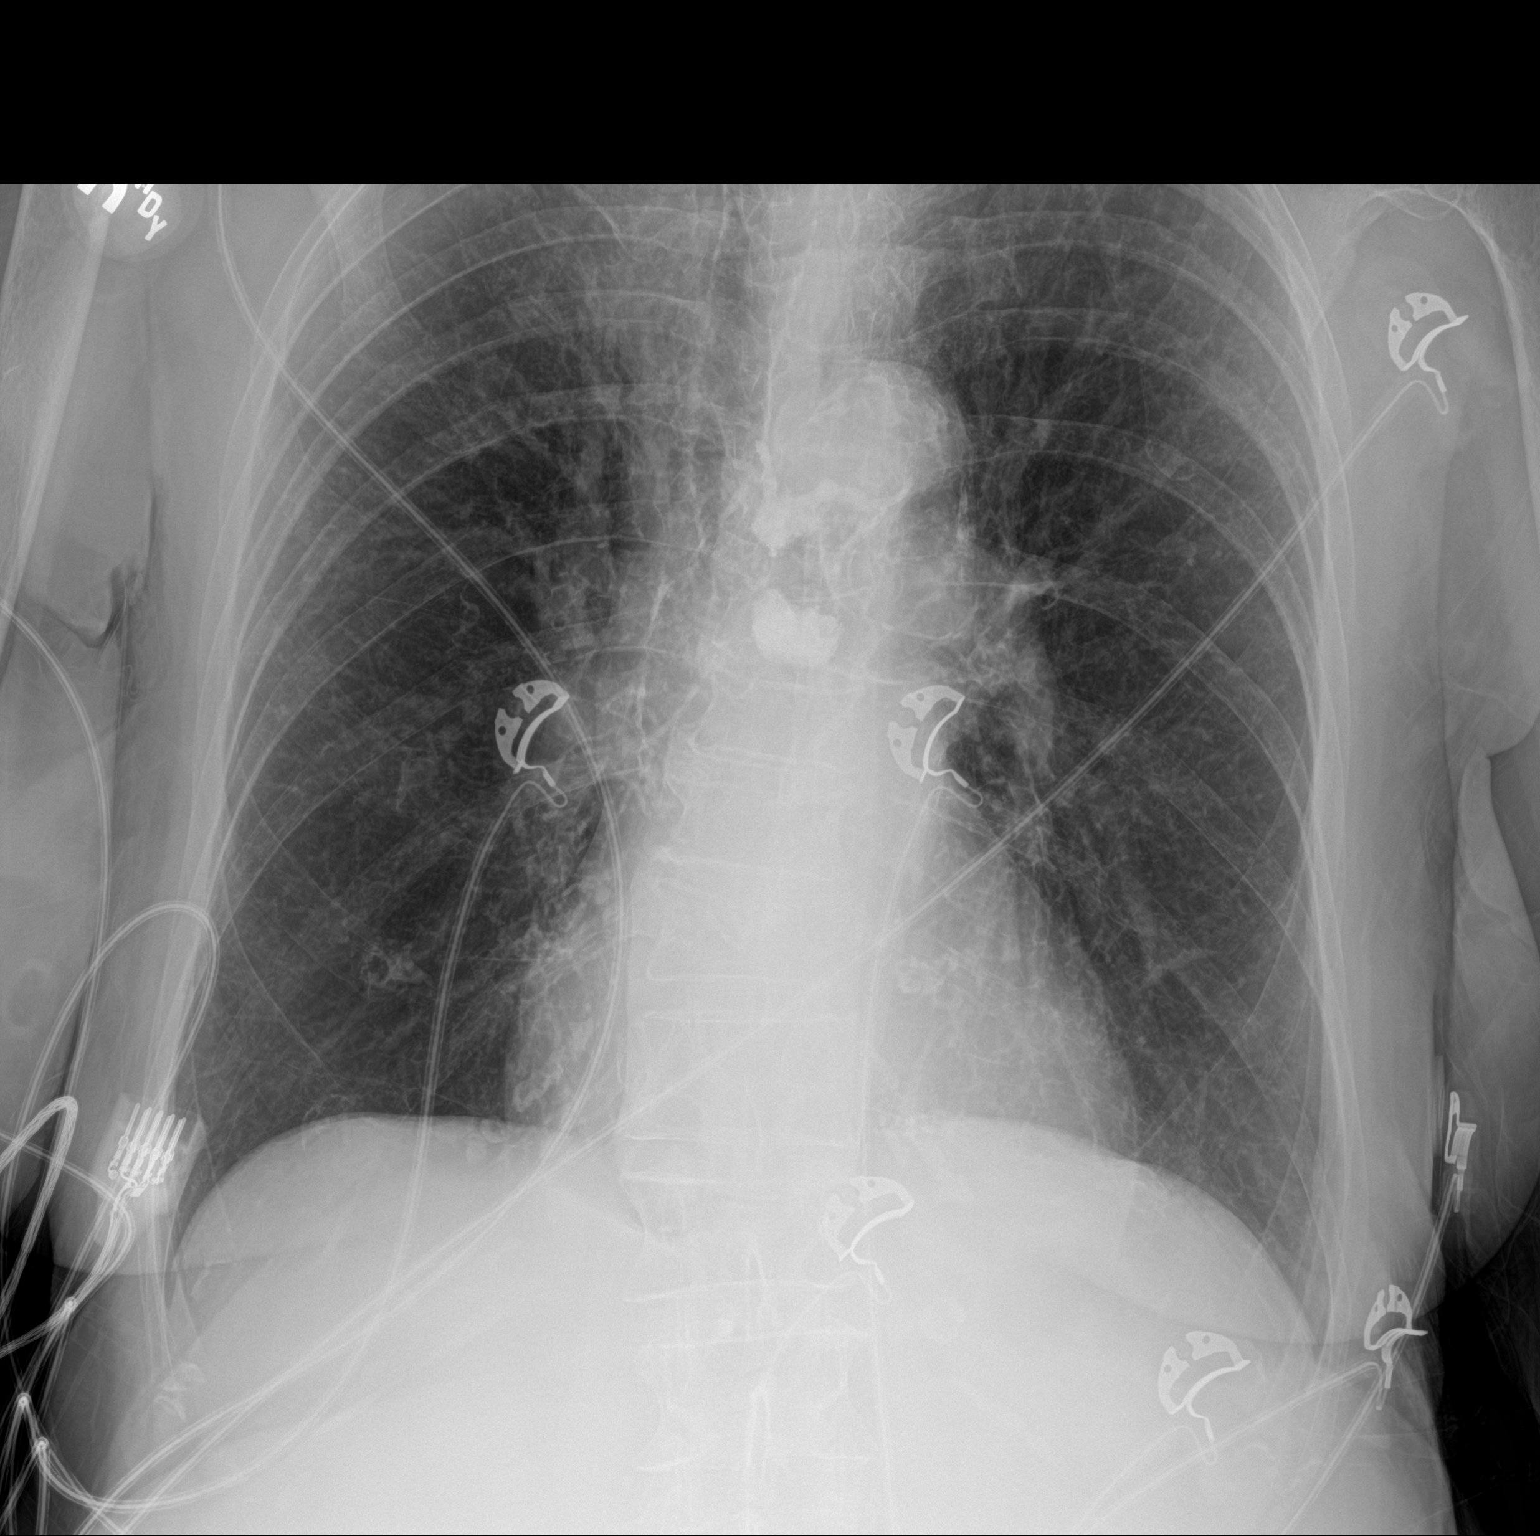

[1 of 1 positions shown; findings below may reference images not displayed]

FINDINGS: Mid thoracic vertebral augmentations. Atherosclerotic aortic arch.
Possible emphysema. Upper zone pulmonary vascular prominence. No
pleural effusion. Bony demineralization.
IMPRESSION: 1. Suspected emphysema.
2. Upper zone pulmonary vascular prominence could reflect pulmonary
venous hypertension. No overt edema.
3. Atherosclerotic aortic arch.
4. Mid thoracic vertebral augmentations; bony demineralization
suggesting osteoporosis.

## 2017-12-15 ENCOUNTER — Telehealth: Payer: Self-pay

## 2017-12-15 NOTE — Telephone Encounter (Signed)
Please advise 

## 2017-12-15 NOTE — Telephone Encounter (Signed)
Pt states she is due to have her handicap placard renewed, and is asking if she can drop off the form to have this filled tomorrow. Please advise.

## 2018-01-03 DIAGNOSIS — J439 Emphysema, unspecified: Secondary | ICD-10-CM | POA: Diagnosis not present

## 2018-01-03 DIAGNOSIS — J9611 Chronic respiratory failure with hypoxia: Secondary | ICD-10-CM | POA: Diagnosis not present

## 2018-01-11 DIAGNOSIS — J9611 Chronic respiratory failure with hypoxia: Secondary | ICD-10-CM | POA: Diagnosis not present

## 2018-01-11 DIAGNOSIS — J439 Emphysema, unspecified: Secondary | ICD-10-CM | POA: Diagnosis not present

## 2018-01-28 ENCOUNTER — Telehealth: Payer: Self-pay | Admitting: *Deleted

## 2018-01-28 NOTE — Progress Notes (Signed)
[name] Female    DOB: December 27, 1929   84 y.o.   MRN: [id number] Visit Date: 01/29/2018  Today's Provider: Mila Merry, MD   Chief Complaint  Patient presents with  . Shortness of Breath  . Constipation   Subjective:    HPI  Patient is here to discuss worsening shortness of breath. Patient believes Trelegy is no longer helping. Patient states she gets out of breath easier than usual. However she attributes her trouble breathing to trouble with constipation and bloating which have been much worse the last couple of months.  She can usually manage constipation with otc laxatives and stool softeners. However over the last month constipation has worsened and the otc medications are no longer effective. Patient states she also has symptoms of nausea and upset stomach. She has tried Taking metamucil, fibercon, Gaffer, and fleets enema with minimal improvement.     Allergies  Allergen Reactions  . Shellfish Allergy   . Codeine     headache  . Prednisone     Altered Mental Status  . Tetracycline      Current Outpatient Medications:  .  albuterol (PROAIR HFA) 108 (90 Base) MCG/ACT inhaler, Inhale 2 puffs into the lungs every 6 (six) hours as needed for wheezing or shortness of breath., Disp: 3 Inhaler, Rfl: 2 .  aspirin 325 MG tablet, Take 325 mg by mouth every other day as needed., Disp: , Rfl:  .  cholecalciferol (VITAMIN D) 1000 UNITS tablet, Take 1,000 Units by mouth daily., Disp: , Rfl:  .  docusate sodium (COLACE) 50 MG capsule, Take 100 mg by mouth at bedtime., Disp: , Rfl:  .  dorzolamide (TRUSOPT) 2 % ophthalmic solution, Place 1 drop into both eyes 3 (three) times daily. , Disp: , Rfl:  .  Fluticasone-Umeclidin-Vilant (TRELEGY ELLIPTA) 100-62.5-25 MCG/INH AEPB, Inhale 1 puff into the lungs daily., Disp: 3 each, Rfl: 4 .  latanoprost (XALATAN) 0.005 % ophthalmic solution, Place 1 drop into both eyes at bedtime., Disp: , Rfl:  .  Multiple Vitamins-Minerals  (CENTRUM SILVER ADULT 50+ PO), Take 1 tablet by mouth daily., Disp: , Rfl:   Review of Systems  Constitutional: Negative for appetite change, chills, fatigue and fever.  Respiratory: Positive for shortness of breath. Negative for chest tightness.   Cardiovascular: Negative for chest pain and palpitations.  Gastrointestinal: Positive for constipation. Negative for abdominal pain, nausea and vomiting.  Neurological: Negative for dizziness and weakness.    Social History   Tobacco Use  . Smoking status: Former Smoker    Packs/day: 1.00    Years: 50.00    Pack years: 50.00    Types: Cigarettes    Last attempt to quit: 06/30/2000    Years since quitting: 17.5  . Smokeless tobacco: Never Used  Substance Use Topics  . Alcohol use: No   Objective:   BP (!) 181/93 (BP Location: Right Arm, Patient Position: Sitting, Cuff Size: Normal)   Pulse (!) 102   Temp 99.2 F (37.3 C) (Oral)   Resp 20   Wt 116 lb (52.6 kg)   SpO2 99%   BMI 21.22 kg/m  Vitals:   01/29/18 1450  BP: (!) 181/93  Pulse: (!) 102  Resp: 20  Temp: 99.2 F (37.3 C)  TempSrc: Oral  SpO2: 99%  Weight: 116 lb (52.6 kg)     Physical Exam   General Appearance:    Alert, cooperative, no distress, but very animated with some pressured  speech today.   Eyes:    PERRL, conjunctiva/corneas clear, EOM's intact       Lungs:     Clear to auscultation bilaterally, respirations unlabored, diminished lung sounds.   Heart:    Regular rate and rhythm  Neurologic:   Awake, alert, oriented x 3. No apparent focal neurological           defect.   Abd:   Mild diffuse tenderness, no discrete masses.        Assessment & Plan:     1. Shortness of breath Normal oximetry and lung exam today. Suspect this is due to anxiety related to her constipation.  - DG Chest 2 View; Future  2. Generalized abdominal pain  - DG Abd 2 Views; Future  3. Constipation, unspecified constipation type try- linaclotide (LINZESS) 145 MCG CAPS  capsule; Take 1 capsule (145 mcg total) by mouth daily before breakfast.  Dispense: 30 capsule; Refill: 2         Mila Merryonald Fisher, MD  [facility]

## 2018-01-28 NOTE — Telephone Encounter (Signed)
Called patient back concerning shortness of breath. Patient states her shortness of breath has worsened. Patient believes Trelegy inhaler is not working. Patient called earlier and scheduled ov for tomorrow to discuss inhaler and constipation. Offered patient the 11:40 appt for today and she refused. Advised patient to call 911 if her shortness of breath worsens. Patient expressed understanding.

## 2018-01-29 ENCOUNTER — Ambulatory Visit
Admission: RE | Admit: 2018-01-29 | Discharge: 2018-01-29 | Disposition: A | Discharge status: Home / Self Care | Payer: Medicare Other | Source: Ambulatory Visit | Attending: Family Medicine | Admitting: Family Medicine

## 2018-01-29 ENCOUNTER — Ambulatory Visit (INDEPENDENT_AMBULATORY_CARE_PROVIDER_SITE_OTHER): Payer: Medicare Other | Admitting: Family Medicine

## 2018-01-29 ENCOUNTER — Encounter: Payer: Self-pay | Admitting: Family Medicine

## 2018-01-29 VITALS — BP 178/90 | HR 80 | Temp 99.2°F | Resp 20 | Wt 116.0 lb

## 2018-01-29 DIAGNOSIS — R109 Unspecified abdominal pain: Secondary | ICD-10-CM | POA: Diagnosis not present

## 2018-01-29 DIAGNOSIS — R0602 Shortness of breath: Secondary | ICD-10-CM | POA: Insufficient documentation

## 2018-01-29 DIAGNOSIS — J439 Emphysema, unspecified: Secondary | ICD-10-CM | POA: Diagnosis not present

## 2018-01-29 DIAGNOSIS — K5900 Constipation, unspecified: Secondary | ICD-10-CM | POA: Diagnosis not present

## 2018-01-29 DIAGNOSIS — R1084 Generalized abdominal pain: Secondary | ICD-10-CM

## 2018-01-29 MED ORDER — LINACLOTIDE 145 MCG PO CAPS: 145.0000 ug | ORAL_CAPSULE | Freq: Every day | ORAL | 2 refills | Status: DC

## 2018-01-29 NOTE — Telephone Encounter (Signed)
Patient seen on 01-29-2018

## 2018-01-29 NOTE — Patient Instructions (Signed)
   Go to the [facility] on [address] for chest and abominal Xray

## 2018-02-03 DIAGNOSIS — J9611 Chronic respiratory failure with hypoxia: Secondary | ICD-10-CM | POA: Diagnosis not present

## 2018-02-03 DIAGNOSIS — J439 Emphysema, unspecified: Secondary | ICD-10-CM | POA: Diagnosis not present

## 2018-02-08 DIAGNOSIS — H401123 Primary open-angle glaucoma, left eye, severe stage: Secondary | ICD-10-CM | POA: Diagnosis not present

## 2018-02-11 DIAGNOSIS — J9611 Chronic respiratory failure with hypoxia: Secondary | ICD-10-CM | POA: Diagnosis not present

## 2018-02-11 DIAGNOSIS — J439 Emphysema, unspecified: Secondary | ICD-10-CM | POA: Diagnosis not present

## 2018-03-06 DIAGNOSIS — J439 Emphysema, unspecified: Secondary | ICD-10-CM | POA: Diagnosis not present

## 2018-03-06 DIAGNOSIS — J9611 Chronic respiratory failure with hypoxia: Secondary | ICD-10-CM | POA: Diagnosis not present

## 2018-03-14 DIAGNOSIS — J9611 Chronic respiratory failure with hypoxia: Secondary | ICD-10-CM | POA: Diagnosis not present

## 2018-03-14 DIAGNOSIS — J439 Emphysema, unspecified: Secondary | ICD-10-CM | POA: Diagnosis not present

## 2018-03-18 ENCOUNTER — Ambulatory Visit (INDEPENDENT_AMBULATORY_CARE_PROVIDER_SITE_OTHER): Payer: Medicare Other

## 2018-03-18 ENCOUNTER — Ambulatory Visit (INDEPENDENT_AMBULATORY_CARE_PROVIDER_SITE_OTHER): Payer: Medicare Other | Admitting: Family Medicine

## 2018-03-18 ENCOUNTER — Encounter: Payer: Self-pay | Admitting: Family Medicine

## 2018-03-18 VITALS — BP 158/78 | HR 80 | Temp 98.9°F | Ht 60.0 in | Wt 118.0 lb

## 2018-03-18 DIAGNOSIS — M79604 Pain in right leg: Secondary | ICD-10-CM

## 2018-03-18 DIAGNOSIS — M47816 Spondylosis without myelopathy or radiculopathy, lumbar region: Secondary | ICD-10-CM | POA: Diagnosis not present

## 2018-03-18 DIAGNOSIS — J439 Emphysema, unspecified: Secondary | ICD-10-CM | POA: Diagnosis not present

## 2018-03-18 DIAGNOSIS — K5900 Constipation, unspecified: Secondary | ICD-10-CM | POA: Diagnosis not present

## 2018-03-18 DIAGNOSIS — M79605 Pain in left leg: Secondary | ICD-10-CM

## 2018-03-18 MED ORDER — BISACODYL 5 MG PO TBEC: 5.0000 mg | DELAYED_RELEASE_TABLET | Freq: Every day | ORAL | 2 refills | Status: DC | PRN

## 2018-03-18 MED ORDER — DOCUSATE SODIUM 100 MG PO CAPS: 200.0000 mg | ORAL_CAPSULE | Freq: Every day | ORAL | 1 refills | Status: AC

## 2018-03-18 MED ORDER — ARFORMOTEROL TARTRATE 15 MCG/2ML IN NEBU: 15.0000 ug | INHALATION_SOLUTION | Freq: Two times a day (BID) | RESPIRATORY_TRACT | 3 refills | Status: DC

## 2018-03-18 NOTE — Patient Instructions (Addendum)
Constipation, Adult Constipation is when a person:  Poops (has a bowel movement) fewer times in a week than normal.  Has a hard time pooping.  Has poop that is dry, hard, or bigger than normal.  Follow these instructions at home: Eating and drinking   Eat foods that have a lot of fiber, such as: ? Fresh fruits and vegetables. ? Whole grains. ? Beans.  Eat less of foods that are high in fat, low in fiber, or overly processed, such as: ? [place] fries. ? Hamburgers. ? Cookies. ? Candy. ? Soda.  Drink enough fluid to keep your pee (urine) clear or pale yellow. General instructions  Exercise regularly or as told by your doctor.  Go to the restroom when you feel like you need to poop. Do not hold it in.  Take over-the-counter and prescription medicines only as told by your doctor. These include any fiber supplements.  Do pelvic floor retraining exercises, such as: ? Doing deep breathing while relaxing your lower belly (abdomen). ? Relaxing your pelvic floor while pooping.  Watch your condition for any changes.  Keep all follow-up visits as told by your doctor. This is important. Contact a doctor if:  You have pain that gets worse.  You have a fever.  You have not pooped for 4 days.  You throw up (vomit).  You are not hungry.  You lose weight.  You are bleeding from the anus.  You have thin, pencil-like poop (stool). Get help right away if:  You have a fever, and your symptoms suddenly get worse.  You leak poop or have blood in your poop.  Your belly feels hard or bigger than normal (is bloated).  You have very bad belly pain.  You feel dizzy or you faint. This information is not intended to replace advice given to you by your health care provider. Make sure you discuss any questions you have with your health care provider. Document Released: 12/03/2007 Document Revised: 01/04/2016 Document Reviewed: 12/05/2015 Elsevier Interactive Patient Education   2018 [employer].   Back Exercises If you have pain in your back, do these exercises 2-3 times each day or as told by your doctor. When the pain goes away, do the exercises once each day, but repeat the steps more times for each exercise (do more repetitions). If you do not have pain in your back, do these exercises once each day or as told by your doctor. Exercises Single Knee to Chest  Do these steps 3-5 times in a row for each leg: 1. Lie on your back on a firm bed or the floor with your legs stretched out. 2. Bring one knee to your chest. 3. Hold your knee to your chest by grabbing your knee or thigh. 4. Pull on your knee until you feel a gentle stretch in your lower back. 5. Keep doing the stretch for 10-30 seconds. 6. Slowly let go of your leg and straighten it.  Pelvic Tilt  Do these steps 5-10 times in a row: 1. Lie on your back on a firm bed or the floor with your legs stretched out. 2. Bend your knees so they point up to the ceiling. Your feet should be flat on the floor. 3. Tighten your lower belly (abdomen) muscles to press your lower back against the floor. This will make your tailbone point up to the ceiling instead of pointing down to your feet or the floor. 4. Stay in this position for 5-10 seconds while you gently  tighten your muscles and breathe evenly.  Cat-Cow  Do these steps until your lower back bends more easily: 1. Get on your hands and knees on a firm surface. Keep your hands under your shoulders, and keep your knees under your hips. You may put padding under your knees. 2. Let your head hang down, and make your tailbone point down to the floor so your lower back is round like the back of a cat. 3. Stay in this position for 5 seconds. 4. Slowly lift your head and make your tailbone point up to the ceiling so your back hangs low (sags) like the back of a cow. 5. Stay in this position for 5 seconds.  Press-Ups  Do these steps 5-10 times in a row: 1. Lie on  your belly (face-down) on the floor. 2. Place your hands near your head, about shoulder-width apart. 3. While you keep your back relaxed and keep your hips on the floor, slowly straighten your arms to raise the top half of your body and lift your shoulders. Do not use your back muscles. To make yourself more comfortable, you may change where you place your hands. 4. Stay in this position for 5 seconds. 5. Slowly return to lying flat on the floor.  Bridges  Do these steps 10 times in a row: 1. Lie on your back on a firm surface. 2. Bend your knees so they point up to the ceiling. Your feet should be flat on the floor. 3. Tighten your butt muscles and lift your butt off of the floor until your waist is almost as high as your knees. If you do not feel the muscles working in your butt and the back of your thighs, slide your feet 1-2 inches farther away from your butt. 4. Stay in this position for 3-5 seconds. 5. Slowly lower your butt to the floor, and let your butt muscles relax.  If this exercise is too easy, try doing it with your arms crossed over your chest. Belly Crunches  Do these steps 5-10 times in a row: 1. Lie on your back on a firm bed or the floor with your legs stretched out. 2. Bend your knees so they point up to the ceiling. Your feet should be flat on the floor. 3. Cross your arms over your chest. 4. Tip your chin a little bit toward your chest but do not bend your neck. 5. Tighten your belly muscles and slowly raise your chest just enough to lift your shoulder blades a tiny bit off of the floor. 6. Slowly lower your chest and your head to the floor.  Back Lifts Do these steps 5-10 times in a row: 1. Lie on your belly (face-down) with your arms at your sides, and rest your forehead on the floor. 2. Tighten the muscles in your legs and your butt. 3. Slowly lift your chest off of the floor while you keep your hips on the floor. Keep the back of your head in line with the  curve in your back. Look at the floor while you do this. 4. Stay in this position for 3-5 seconds. 5. Slowly lower your chest and your face to the floor.  Contact a doctor if:  Your back pain gets a lot worse when you do an exercise.  Your back pain does not lessen 2 hours after you exercise. If you have any of these problems, stop doing the exercises. Do not do them again unless your doctor says it is  okay. Get help right away if:  You have sudden, very bad back pain. If this happens, stop doing the exercises. Do not do them again unless your doctor says it is okay. This information is not intended to replace advice given to you by your health care provider. Make sure you discuss any questions you have with your health care provider. Document Released: 07/19/2010 Document Revised: 11/22/2015 Document Reviewed: 08/10/2014 Elsevier Interactive Patient Education  [employer].

## 2018-03-18 NOTE — Progress Notes (Signed)
Subjective:    Patient ID: [name] DownsEsther M Wender, female    DOB: 1929/09/14, 84 y.o.   MRN: [id number]  HPI   Patient presents to clinic to establish care with new PCP.  She previously was [facility], but states a friend recommended this office so she decided to change to our clinic.  Patient's problem list, past medical history, social history, surgical history, family history reviewed and updated in our chart.  Patient's 3 main concerns today are: 1.  Trelegy medication for her Emphysema/COPD, feels it is not effective enough and also feels it is causing constipation. 2.  Constipation 3.  Bilateral lower leg pain x many months  Patient states she has been taking Brovana in the past, and felt this medicine is very effective.  States she was switched to Trelegy a few months ago, does not feel this medication is effective and notes she feels short of breath almost every day, when she was on [place] she did not feel this way.  Patient also feels that her constipation worsened when starting Trelegy.  Patient currently takes Colace 50 mg daily to help constipation.  States she has tried Metamucil in the past and this medication made her feel very bloated.  Most recently has tried Linzess for her constipation, but feels this medication also makes her feel very bloated.  Patient states she has complaining of bilateral lower leg pain for many months to a few of her medical providers.  States she has been told she has arthritis in her lower back and in hips.  Patient most recent imaging of the lumbar spine was more than 2 years ago as far as patient can remember.  States the pain will radiate from low back to hips down both legs, states there are times her legs almost will feel numb.  Legs will also have a deep muscle aching type pain and she will have to rub muscles to help them to feel better.  Patient has had flu vaccine for 2019-2020 season at the pharmacy.  Patient has  had both the Prevnar 13 and Pneumovax 23 pneumonia vaccines.  Patient Active Problem List   Diagnosis Date Noted  . Abnormal ankle brachial index (ABI) 11/09/2017  . Acalculous cholecystitis 04/07/2016  . Fall 04/07/2016  . Glaucoma 11/29/2014  . Insomnia 11/29/2014  . Arthritis, degenerative 11/29/2014  . Dependence on supplemental oxygen 11/29/2014  . 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 COPD (chronic obstructive pulmonary disease) (HCC) 11/29/2013  . Chronic hypoxemic respiratory failure (HCC) 11/29/2013   Social History   Tobacco Use  . Smoking status: Former Smoker    Packs/day: 1.00    Years: 50.00    Pack years: 50.00    Types: Cigarettes    Last attempt to quit: 06/30/2000    Years since quitting: 17.7  . Smokeless tobacco: Never Used  Substance Use Topics  . Alcohol use: No   Family History  Problem Relation Age of Onset  . Cancer Sister        cervical  . Cerebral aneurysm Father        reason for death   Past Surgical History:  Procedure Laterality Date  . APPENDECTOMY  1947  . BREAST SURGERY  1969   biopsy  . HIP FRACTURE SURGERY Right 2008  . VAGINAL HYSTERECTOMY  1975    Review of Systems   Constitutional: Negative for chills, fatigue and fever.  HENT: Negative for congestion, ear pain, sinus pain and sore throat.  Eyes: Negative.   Respiratory: Negative for cough. Chronic shortness of breath. Some wheezing.   Cardiovascular: Negative for chest pain, palpitations and leg swelling.  Gastrointestinal: Negative for abdominal pain, diarrhea, nausea and vomiting. +constipation Genitourinary: Negative for dysuria, frequency and urgency.  Musculoskeletal: Bilateral leg aches and pains xMonths. Skin: Negative for color change, pallor and rash.  Neurological: Negative for syncope, light-headedness and headaches.  Psychiatric/Behavioral: The patient is not nervous/anxious.       Objective:   Physical Exam  Constitutional: She is oriented to person, place, and time.  She appears well-nourished. No distress.  HENT:  Head: Normocephalic and atraumatic.  Eyes: Pupils are equal, round, and reactive to light. EOM are normal. No scleral icterus.  Neck: Normal range of motion. Neck supple. No tracheal deviation present.  Cardiovascular: Normal rate and regular rhythm.  Pulmonary/Chest: Effort normal. No respiratory distress. She has no wheezes. She has no rales.  Diffusely diminished breath sounds throughout. Uses supplemental oxygen when knows she has to walk longer distances, will take it off when able to sit and rest.   Abdominal: Soft. Bowel sounds are normal. She exhibits no distension. There is no tenderness. There is no guarding.  Musculoskeletal: She exhibits no edema or deformity.  Neurological: She is alert and oriented to person, place, and time.  Skin: Skin is warm and dry. No pallor.  Psychiatric: She has a normal mood and affect. Her behavior is normal.  Nursing note and vitals reviewed.   Vitals:   03/18/18 1515  BP: (!) 158/78  Pulse: 80  Temp: 98.9 F (37.2 C)  SpO2: 95%      Assessment & Plan:   Emphysema/COPD - patient will stop trilogy.  We will go back to Kila nebulization treatments twice daily as this medication was effective patient in the past.  Constipation - we will increase dose of Colace to 200 mg every day.  Patient also given prescription for Dulcolax 5 mg to use as needed for when she has not had a bowel movement in a few days.  Advised to be sure to have good water intake, he fiber rich foods.  Bilateral leg pain - due to pain radiating from low back down to hips and legs I suspect it could also done from arthritis in the lumbar spine.  Patient is agreeable to x-ray of lumbar spine today.  Also advised that she can try taking Tylenol 650 mg 1-2 times a day to see if this helps better control pain.   I suggested we check some lab work to look at electrolytes, liver, kidney, vitamin levels however patient declines at  this time.  Follow-up in approximately 4 weeks for recheck on breathing, constipation, pain after medication changes.  Return to clinic sooner if issues arise.

## 2018-03-19 ENCOUNTER — Encounter: Payer: Self-pay | Admitting: Family Medicine

## 2018-03-19 DIAGNOSIS — K5900 Constipation, unspecified: Secondary | ICD-10-CM | POA: Insufficient documentation

## 2018-03-19 DIAGNOSIS — M79604 Pain in right leg: Secondary | ICD-10-CM | POA: Insufficient documentation

## 2018-03-19 DIAGNOSIS — M79605 Pain in left leg: Secondary | ICD-10-CM

## 2018-03-23 ENCOUNTER — Encounter: Payer: Self-pay | Admitting: Lab

## 2018-04-05 DIAGNOSIS — J439 Emphysema, unspecified: Secondary | ICD-10-CM | POA: Diagnosis not present

## 2018-04-05 DIAGNOSIS — J9611 Chronic respiratory failure with hypoxia: Secondary | ICD-10-CM | POA: Diagnosis not present

## 2018-04-13 DIAGNOSIS — J439 Emphysema, unspecified: Secondary | ICD-10-CM | POA: Diagnosis not present

## 2018-04-13 DIAGNOSIS — J9611 Chronic respiratory failure with hypoxia: Secondary | ICD-10-CM | POA: Diagnosis not present

## 2018-04-15 ENCOUNTER — Ambulatory Visit: Payer: Medicare Other | Admitting: Family Medicine

## 2018-05-06 DIAGNOSIS — J9611 Chronic respiratory failure with hypoxia: Secondary | ICD-10-CM | POA: Diagnosis not present

## 2018-05-06 DIAGNOSIS — J439 Emphysema, unspecified: Secondary | ICD-10-CM | POA: Diagnosis not present

## 2018-05-14 DIAGNOSIS — J9611 Chronic respiratory failure with hypoxia: Secondary | ICD-10-CM | POA: Diagnosis not present

## 2018-05-14 DIAGNOSIS — J439 Emphysema, unspecified: Secondary | ICD-10-CM | POA: Diagnosis not present

## 2018-06-05 DIAGNOSIS — J9611 Chronic respiratory failure with hypoxia: Secondary | ICD-10-CM | POA: Diagnosis not present

## 2018-06-05 DIAGNOSIS — J439 Emphysema, unspecified: Secondary | ICD-10-CM | POA: Diagnosis not present

## 2018-06-13 DIAGNOSIS — J9611 Chronic respiratory failure with hypoxia: Secondary | ICD-10-CM | POA: Diagnosis not present

## 2018-06-13 DIAGNOSIS — J439 Emphysema, unspecified: Secondary | ICD-10-CM | POA: Diagnosis not present

## 2018-06-17 DIAGNOSIS — G8929 Other chronic pain: Secondary | ICD-10-CM | POA: Diagnosis not present

## 2018-06-17 DIAGNOSIS — G4701 Insomnia due to medical condition: Secondary | ICD-10-CM | POA: Diagnosis not present

## 2018-06-17 DIAGNOSIS — M5416 Radiculopathy, lumbar region: Secondary | ICD-10-CM | POA: Diagnosis not present

## 2018-06-17 DIAGNOSIS — Z23 Encounter for immunization: Secondary | ICD-10-CM | POA: Diagnosis not present

## 2018-06-17 DIAGNOSIS — J439 Emphysema, unspecified: Secondary | ICD-10-CM | POA: Diagnosis not present

## 2018-07-05 DIAGNOSIS — H401130 Primary open-angle glaucoma, bilateral, stage unspecified: Secondary | ICD-10-CM | POA: Diagnosis not present

## 2018-07-06 DIAGNOSIS — J439 Emphysema, unspecified: Secondary | ICD-10-CM | POA: Diagnosis not present

## 2018-07-06 DIAGNOSIS — J9611 Chronic respiratory failure with hypoxia: Secondary | ICD-10-CM | POA: Diagnosis not present

## 2018-07-14 DIAGNOSIS — J9611 Chronic respiratory failure with hypoxia: Secondary | ICD-10-CM | POA: Diagnosis not present

## 2018-07-14 DIAGNOSIS — J439 Emphysema, unspecified: Secondary | ICD-10-CM | POA: Diagnosis not present

## 2018-07-26 DIAGNOSIS — I739 Peripheral vascular disease, unspecified: Secondary | ICD-10-CM | POA: Diagnosis not present

## 2018-07-26 DIAGNOSIS — K5909 Other constipation: Secondary | ICD-10-CM | POA: Diagnosis not present

## 2018-07-26 DIAGNOSIS — J439 Emphysema, unspecified: Secondary | ICD-10-CM | POA: Diagnosis not present

## 2018-09-10 ENCOUNTER — Other Ambulatory Visit (INDEPENDENT_AMBULATORY_CARE_PROVIDER_SITE_OTHER): Payer: Self-pay | Admitting: Vascular Surgery

## 2018-09-10 DIAGNOSIS — M79606 Pain in leg, unspecified: Secondary | ICD-10-CM

## 2018-09-13 ENCOUNTER — Other Ambulatory Visit: Payer: Self-pay

## 2018-09-13 ENCOUNTER — Ambulatory Visit (INDEPENDENT_AMBULATORY_CARE_PROVIDER_SITE_OTHER): Payer: Medicare Other

## 2018-09-13 ENCOUNTER — Ambulatory Visit (INDEPENDENT_AMBULATORY_CARE_PROVIDER_SITE_OTHER): Payer: Medicare Other | Admitting: Vascular Surgery

## 2018-09-13 ENCOUNTER — Encounter (INDEPENDENT_AMBULATORY_CARE_PROVIDER_SITE_OTHER): Payer: Self-pay | Admitting: Vascular Surgery

## 2018-09-13 VITALS — BP 171/83 | HR 137 | Resp 20 | Ht 61.0 in | Wt 122.0 lb

## 2018-09-13 DIAGNOSIS — M150 Primary generalized (osteo)arthritis: Secondary | ICD-10-CM | POA: Diagnosis not present

## 2018-09-13 DIAGNOSIS — M79606 Pain in leg, unspecified: Secondary | ICD-10-CM | POA: Diagnosis not present

## 2018-09-13 DIAGNOSIS — R200 Anesthesia of skin: Secondary | ICD-10-CM | POA: Diagnosis not present

## 2018-09-13 DIAGNOSIS — J439 Emphysema, unspecified: Secondary | ICD-10-CM

## 2018-09-13 DIAGNOSIS — Z79899 Other long term (current) drug therapy: Secondary | ICD-10-CM

## 2018-09-13 DIAGNOSIS — M159 Polyosteoarthritis, unspecified: Secondary | ICD-10-CM

## 2018-09-13 DIAGNOSIS — Z7902 Long term (current) use of antithrombotics/antiplatelets: Secondary | ICD-10-CM

## 2018-09-13 DIAGNOSIS — Z791 Long term (current) use of non-steroidal anti-inflammatories (NSAID): Secondary | ICD-10-CM

## 2018-09-14 ENCOUNTER — Encounter (INDEPENDENT_AMBULATORY_CARE_PROVIDER_SITE_OTHER): Payer: Self-pay | Admitting: Vascular Surgery

## 2018-09-14 DIAGNOSIS — R200 Anesthesia of skin: Secondary | ICD-10-CM | POA: Insufficient documentation

## 2018-09-14 NOTE — Progress Notes (Signed)
MRN : [id number]  [name] [name] is a 85 y.o. ([birth year]) female who presents with chief complaint of  Chief Complaint  Patient presents with  . Follow-up    ultrasound  .  History of Present Illness:   The patient is seen for evaluation of painful lower extremities. Patient notes the pain is variable and not always associated with activity.  The pain is somewhat consistent day to day occurring on most days. The patient notes the pain also occurs with standing and routinely seems worse as the day wears on. The pain has been progressive over the past several years. The patient states these symptoms are causing  a profound negative impact on quality of life and daily activities.  The patient denies rest pain or dangling of an extremity off the side of the bed during the night for relief. No open wounds or sores at this time. No history of DVT or phlebitis. No prior interventions or surgeries.  There is a  history of back problems and DJD of the lumbar and sacral spine.   ABIs done today right equals 1.01 and left equals 1.01 (there are triphasic Doppler signals bilaterally)   Current Meds  Medication Sig  . aspirin 325 MG tablet Take 325 mg by mouth every other day as needed.  . cholecalciferol (VITAMIN D) 1000 UNITS tablet Take 1,000 Units by mouth daily.  . Ipratropium-Albuterol (COMBIVENT) 20-100 MCG/ACT AERS respimat Inhale into the lungs.  . latanoprost (XALATAN) 0.005 % ophthalmic solution Place 1 drop into both eyes at bedtime.  . levalbuterol (XOPENEX) 1.25 MG/3ML nebulizer solution Inhale into the lungs.  . Multiple Vitamins-Minerals (CENTRUM SILVER ADULT 50+ PO) Take 1 tablet by mouth daily.    Past Medical History:  Diagnosis Date  . Arthritis   . Back pain   . Chicken pox   . Chronic respiratory failure (HCC)   . COPD (chronic obstructive pulmonary disease) (HCC)   . Emphysema lung (HCC)   . Glaucoma   . Insomnia   . Nocturnal leg cramps   . Osteoarthritis    . Rhabdomyolysis 04/07/2016  . Urine incontinence     Past Surgical History:  Procedure Laterality Date  . APPENDECTOMY  1947  . BREAST SURGERY  1969   biopsy  . COLOSTOMY    . HIP FRACTURE SURGERY Right 2008  . VAGINAL HYSTERECTOMY  1975    Social History Social History   Tobacco Use  . Smoking status: Former Smoker    Packs/day: 1.00    Years: 50.00    Pack years: 50.00    Types: Cigarettes    Last attempt to quit: 06/30/2000    Years since quitting: 18.2  . Smokeless tobacco: Never Used  Substance Use Topics  . Alcohol use: No  . Drug use: No    Family History Family History  Problem Relation Age of Onset  . Cancer Sister        cervical  . Cerebral aneurysm Father        reason for death  No family history of bleeding/clotting disorders, porphyria or autoimmune disease   Allergies  Allergen Reactions  . Shellfish Allergy   . Codeine     headache  . Prednisone     Altered Mental Status  . Tetracycline      REVIEW OF SYSTEMS (Negative unless checked)  Constitutional: [] Weight loss  [] Fever  [] Chills Cardiac: [] Chest pain   [] Chest pressure   [] Palpitations   [] Shortness of breath when  laying flat   Shortness of breath with exertion. Vascular:  Pain in legs with walking   Pain in legs at rest  History of DVT   Phlebitis   Swelling in legs   Varicose veins   Non-healing ulcers Pulmonary:   Uses home oxygen   Productive cough   Hemoptysis   Wheeze  COPD   Asthma Neurologic:  Dizziness   Seizures   History of stroke   History of TIA  Aphasia   Vissual changes   Weakness or numbness in arm   Weakness or numbness in leg Musculoskeletal:   Joint swelling   Joint pain   Back pain Hematologic:  Easy bruising  Easy bleeding   Hypercoagulable state   Anemic Gastrointestinal:  Diarrhea   Vomiting  Gastroesophageal reflux/heartburn   Difficulty swallowing. Genitourinary:  Chronic kidney disease    Difficult urination  Frequent urination   Blood in urine Skin:  Rashes   Ulcers  Psychological:  History of anxiety    History of major depression.  Physical Examination  Vitals:   09/13/18 1356  BP: (!) 171/83  Pulse: (!) 137  Resp: 20  Weight: 122 lb (55.3 kg)  Height:  (1.549 m)   Body mass index is 23.05 kg/m. Gen: WD/thin, NAD wearing oxygen by nasal cannula, frail-appearing Head: [place]/AT, No temporalis wasting.  Ear/Nose/Throat: Hearing grossly intact, nares w/o erythema or drainage, poor dentition Eyes: PER, EOMI, sclera nonicteric.  Neck: Supple, no masses.  No bruit or JVD.  Pulmonary:  Good air movement, clear to auscultation bilaterally, no use of accessory muscles.  Cardiac: RRR, normal S1, S2, no Murmurs. Vascular:  Vessel Right Left  Radial Palpable Palpable  PT  1+ palpable  1+ palpable  DP  trace palpable  trace palpable  Gastrointestinal: soft, non-distended. No guarding/no peritoneal signs.  Musculoskeletal: M/S 5/5 throughout.  Arthritic deformity of multiple joints Neurologic: CN 2-12 intact. Pain and light touch intact in extremities.  Symmetrical.  Speech is fluent. Motor exam as listed above. Psychiatric: Judgment intact, Mood & affect appropriate for pt's clinical situation. Dermatologic: No rashes or ulcers noted.  No changes consistent with cellulitis. Lymph : No Cervical lymphadenopathy, no lichenification or skin changes of chronic lymphedema.  CBC Lab Results  Component Value Date   WBC 6.7 10/27/2017   HGB 13.5 10/27/2017   HCT 40.6 10/27/2017   MCV 94 10/27/2017   PLT 180 10/27/2017    BMET    Component Value Date/Time   NA 143 04/13/2016 0540   NA 136 03/23/2013 1239   K 3.5 04/13/2016 0540   K 3.8 03/23/2013 1239   CL 105 04/13/2016 0540   CL 104 03/23/2013 1239   CO2 27 04/13/2016 0540   CO2 25 03/23/2013 1239   GLUCOSE 141 (H) 04/13/2016 0540   GLUCOSE 142 (H) 03/23/2013 1239   BUN 11 04/13/2016 0540   BUN  11 03/23/2013 1239   CREATININE 0.57 04/13/2016 0540   CREATININE 0.75 03/23/2013 1239   CALCIUM 8.9 04/13/2016 0540   CALCIUM 9.5 03/23/2013 1239   GFRNONAA >60 04/13/2016 0540   GFRNONAA >60 03/23/2013 1239   GFRAA >60 04/13/2016 0540   GFRAA >60 03/23/2013 1239   CrCl cannot be calculated (Patient's most recent lab result is older than the maximum 21 days allowed.).  COAG Lab Results  Component Value Date   INR 1.04 04/04/2016    Radiology No results found.  Assessment/Plan 1. Bilateral leg numbness Recommend:  I do not find  evidence of Vascular pathology that would explain the patient's symptoms  The patient has atypical pain symptoms for vascular disease  Noninvasive studies including venous ultrasound of the legs do not identify vascular problems  The patient should continue walking and begin a more formal exercise program. The patient should continue his antiplatelet therapy and aggressive treatment of the lipid abnormalities. The patient should begin wearing graduated compression socks 15-20 mmHg strength to control her mild edema.  Patient will follow-up with me on a PRN basis  Further work-up of her lower extremity pain is deferred to the primary service     2. Primary osteoarthritis involving multiple joints Continue NSAID medications as already ordered, these medications have been reviewed and there are no changes at this time.  Continued activity and therapy was stressed.   3. Pulmonary emphysema, unspecified emphysema type (HCC) Continue pulmonary medications and aerosols as already ordered, these medications have been reviewed and there are no changes at this time.      Levora Dredge, MD  09/14/2018 10:01 AM

## 2018-10-07 ENCOUNTER — Other Ambulatory Visit: Payer: Self-pay

## 2018-10-07 ENCOUNTER — Encounter: Payer: Self-pay | Admitting: *Deleted

## 2018-10-07 ENCOUNTER — Emergency Department: Payer: Medicare Other

## 2018-10-07 ENCOUNTER — Inpatient Hospital Stay
Admission: EM | Admit: 2018-10-07 | Discharge: 2018-10-09 | DRG: 292 | Disposition: A | Discharge status: Home Health | Payer: Medicare Other | Attending: Internal Medicine | Admitting: Internal Medicine

## 2018-10-07 DIAGNOSIS — Z888 Allergy status to other drugs, medicaments and biological substances status: Secondary | ICD-10-CM | POA: Diagnosis not present

## 2018-10-07 DIAGNOSIS — J439 Emphysema, unspecified: Secondary | ICD-10-CM | POA: Diagnosis present

## 2018-10-07 DIAGNOSIS — J9611 Chronic respiratory failure with hypoxia: Secondary | ICD-10-CM | POA: Diagnosis present

## 2018-10-07 DIAGNOSIS — R0602 Shortness of breath: Secondary | ICD-10-CM

## 2018-10-07 DIAGNOSIS — M1990 Unspecified osteoarthritis, unspecified site: Secondary | ICD-10-CM | POA: Diagnosis present

## 2018-10-07 DIAGNOSIS — Z881 Allergy status to other antibiotic agents status: Secondary | ICD-10-CM | POA: Diagnosis not present

## 2018-10-07 DIAGNOSIS — Z79899 Other long term (current) drug therapy: Secondary | ICD-10-CM | POA: Diagnosis not present

## 2018-10-07 DIAGNOSIS — Z87891 Personal history of nicotine dependence: Secondary | ICD-10-CM | POA: Diagnosis not present

## 2018-10-07 DIAGNOSIS — Z885 Allergy status to narcotic agent status: Secondary | ICD-10-CM

## 2018-10-07 DIAGNOSIS — Z8049 Family history of malignant neoplasm of other genital organs: Secondary | ICD-10-CM | POA: Diagnosis not present

## 2018-10-07 DIAGNOSIS — I081 Rheumatic disorders of both mitral and tricuspid valves: Secondary | ICD-10-CM | POA: Diagnosis present

## 2018-10-07 DIAGNOSIS — Z66 Do not resuscitate: Secondary | ICD-10-CM | POA: Diagnosis present

## 2018-10-07 DIAGNOSIS — T501X5A Adverse effect of loop [high-ceiling] diuretics, initial encounter: Secondary | ICD-10-CM | POA: Diagnosis not present

## 2018-10-07 DIAGNOSIS — E876 Hypokalemia: Secondary | ICD-10-CM | POA: Diagnosis not present

## 2018-10-07 DIAGNOSIS — Z90710 Acquired absence of both cervix and uterus: Secondary | ICD-10-CM | POA: Diagnosis not present

## 2018-10-07 DIAGNOSIS — Z8249 Family history of ischemic heart disease and other diseases of the circulatory system: Secondary | ICD-10-CM | POA: Diagnosis not present

## 2018-10-07 DIAGNOSIS — Z7982 Long term (current) use of aspirin: Secondary | ICD-10-CM | POA: Diagnosis not present

## 2018-10-07 DIAGNOSIS — H409 Unspecified glaucoma: Secondary | ICD-10-CM | POA: Diagnosis present

## 2018-10-07 DIAGNOSIS — I5031 Acute diastolic (congestive) heart failure: Secondary | ICD-10-CM | POA: Diagnosis present

## 2018-10-07 DIAGNOSIS — Z9981 Dependence on supplemental oxygen: Secondary | ICD-10-CM

## 2018-10-07 DIAGNOSIS — I4891 Unspecified atrial fibrillation: Secondary | ICD-10-CM

## 2018-10-07 DIAGNOSIS — I509 Heart failure, unspecified: Secondary | ICD-10-CM

## 2018-10-07 LAB — BASIC METABOLIC PANEL
Anion gap: 10 (ref 5–15)
BUN: 13 mg/dL (ref 8–23)
CO2: 24 mmol/L (ref 22–32)
Calcium: 8.7 mg/dL — ABNORMAL LOW (ref 8.9–10.3)
Chloride: 105 mmol/L (ref 98–111)
Creatinine, Ser: 0.67 mg/dL (ref 0.44–1.00)
GFR calc Af Amer: >60 mL/min (ref >60)
GFR calc non Af Amer: >60 mL/min (ref >60)
Glucose, Bld: 142 mg/dL — ABNORMAL HIGH (ref 70–99)
Potassium: 4.1 mmol/L (ref 3.5–5.1)
Sodium: 139 mmol/L (ref 135–145)

## 2018-10-07 LAB — CBC
HCT: 39.4 % (ref 36.0–46.0)
Hemoglobin: 12.8 g/dL (ref 12.0–15.0)
MCH: 31 pg (ref 26.0–34.0)
MCHC: 32.5 g/dL (ref 30.0–36.0)
MCV: 95.4 fL (ref 80.0–100.0)
Platelets: 185 10*3/uL (ref 150–400)
RBC: 4.13 MIL/uL (ref 3.87–5.11)
RDW: 14.2 % (ref 11.5–15.5)
WBC: 6.4 10*3/uL (ref 4.0–10.5)
nRBC: 0 % (ref 0.0–0.2)

## 2018-10-07 LAB — TROPONIN I: Troponin I: <0.03 ng/mL (ref <0.03)

## 2018-10-07 MED ORDER — ACETAMINOPHEN 325 MG PO TABS
650.0000 mg | ORAL_TABLET | ORAL | Status: DC | PRN
  Administered 2018-10-08 – 2018-10-09 (×5): 650 mg via ORAL
  Filled 2018-10-07 (×4): qty 2

## 2018-10-07 MED ORDER — SODIUM CHLORIDE 0.9% FLUSH
3.0000 mL | Freq: Once | INTRAVENOUS | Status: AC
  Administered 2018-10-07: 3 mL via INTRAVENOUS

## 2018-10-07 MED ORDER — LISINOPRIL 5 MG PO TABS
2.5000 mg | ORAL_TABLET | Freq: Every day | ORAL | Status: DC
  Administered 2018-10-07 – 2018-10-08 (×2): 2.5 mg via ORAL
  Filled 2018-10-07 (×3): qty 1

## 2018-10-07 MED ORDER — DILTIAZEM HCL 30 MG PO TABS
60.0000 mg | ORAL_TABLET | Freq: Three times a day (TID) | ORAL | Status: DC
  Administered 2018-10-07 – 2018-10-08 (×3): 60 mg via ORAL
  Filled 2018-10-07: qty 2
  Filled 2018-10-07: qty 1
  Filled 2018-10-07: qty 2

## 2018-10-07 MED ORDER — DILTIAZEM HCL 25 MG/5ML IV SOLN
20.0000 mg | Freq: Once | INTRAVENOUS | Status: AC
  Administered 2018-10-07: 20 mg via INTRAVENOUS
  Filled 2018-10-07: qty 5

## 2018-10-07 MED ORDER — SODIUM CHLORIDE 0.9% FLUSH: 3.0000 mL | INTRAVENOUS | Status: DC | PRN

## 2018-10-07 MED ORDER — LEVALBUTEROL HCL 1.25 MG/0.5ML IN NEBU
1.2500 mg | INHALATION_SOLUTION | Freq: Three times a day (TID) | RESPIRATORY_TRACT | Status: DC
  Administered 2018-10-07 – 2018-10-09 (×5): 1.25 mg via RESPIRATORY_TRACT
  Filled 2018-10-07 (×9): qty 0.5

## 2018-10-07 MED ORDER — BUDESONIDE 0.25 MG/2ML IN SUSP
0.2500 mg | Freq: Two times a day (BID) | RESPIRATORY_TRACT | Status: DC
  Administered 2018-10-07 – 2018-10-09 (×4): 0.25 mg via RESPIRATORY_TRACT
  Filled 2018-10-07 (×4): qty 2

## 2018-10-07 MED ORDER — LATANOPROST 0.005 % OP SOLN
1.0000 [drp] | Freq: Every day | OPHTHALMIC | Status: DC
  Administered 2018-10-07 – 2018-10-08 (×2): 1 [drp] via OPHTHALMIC
  Filled 2018-10-07: qty 2.5

## 2018-10-07 MED ORDER — SODIUM CHLORIDE 0.9 % IV SOLN: 250.0000 mL | INTRAVENOUS | Status: DC | PRN

## 2018-10-07 MED ORDER — FUROSEMIDE 10 MG/ML IJ SOLN
40.0000 mg | Freq: Two times a day (BID) | INTRAMUSCULAR | Status: DC
  Administered 2018-10-08 (×2): 40 mg via INTRAVENOUS
  Filled 2018-10-07 (×3): qty 4

## 2018-10-07 MED ORDER — SODIUM CHLORIDE 0.9% FLUSH
3.0000 mL | Freq: Two times a day (BID) | INTRAVENOUS | Status: DC
  Administered 2018-10-07 – 2018-10-09 (×4): 3 mL via INTRAVENOUS

## 2018-10-07 MED ORDER — DORZOLAMIDE HCL 2 % OP SOLN
1.0000 [drp] | Freq: Three times a day (TID) | OPHTHALMIC | Status: DC
  Administered 2018-10-07 – 2018-10-09 (×5): 1 [drp] via OPHTHALMIC
  Filled 2018-10-07: qty 10

## 2018-10-07 MED ORDER — ENOXAPARIN SODIUM 60 MG/0.6ML ~~LOC~~ SOLN
1.0000 mg/kg | Freq: Two times a day (BID) | SUBCUTANEOUS | Status: DC
  Administered 2018-10-07 – 2018-10-09 (×4): 55 mg via SUBCUTANEOUS
  Filled 2018-10-07 (×7): qty 0.6

## 2018-10-07 MED ORDER — FUROSEMIDE 10 MG/ML IJ SOLN
40.0000 mg | Freq: Once | INTRAMUSCULAR | Status: AC
  Administered 2018-10-07: 40 mg via INTRAVENOUS
  Filled 2018-10-07: qty 4

## 2018-10-07 MED ORDER — CARVEDILOL 3.125 MG PO TABS
3.1250 mg | ORAL_TABLET | Freq: Two times a day (BID) | ORAL | Status: DC
  Administered 2018-10-08: 3.125 mg via ORAL
  Filled 2018-10-07: qty 1

## 2018-10-07 MED ORDER — ONDANSETRON HCL 4 MG/2ML IJ SOLN: 4.0000 mg | Freq: Four times a day (QID) | INTRAMUSCULAR | Status: DC | PRN

## 2018-10-07 NOTE — Progress Notes (Signed)
Advanced care plan.  Purpose of the Encounter: CODE STATUS  Parties in Attendance: Patient herself   Patient's Decision Capacity: Intact  Subjective/Patient's story: [name] [name]  is a 85 y.o. female with a known history of COPD, glaucoma, chronic respiratory failure on oxygen she is now 100% sure she thinks she may be on 5 L oxygen however in the ER she is requiring 2 L oxygen presenting with worsening shortness of breath.  Patient states that her shortness of breath has gotten worse over the past few months.  She has been seen by pulmonary as outpatient.  She is presenting now with worsening shortness of breath for the past 2 days.  She is noticed that she is gotten swelling in her lower extremity.  Patient also is unable to lay flat.  She also has had wheezing.  Also has felt chest pressure.  Patient has severe COPD and now being admitted with acute CHF and A. fib  Objective/Medical story  I discussed with the patient regarding her desires for cardiac and pulmonary resuscitation.  Discussed with her what this entails.  Also discussed with her regarding healthcare power of attorney and living will  Goals of care determination:  Patient states that she wants to be a DNR.  She states that she has a living will and healthcare power of attorney   CODE STATUS: DNR   Time spent discussing advanced care planning: 16 minutes

## 2018-10-07 NOTE — ED Notes (Signed)
Attempted to call report- informed that 2A charge nurse has not looked at assignment. Floor requests that this RN call back.

## 2018-10-07 NOTE — Progress Notes (Signed)
Patient heart rate hight, approximately 130-140bpm. Patient respirations increased due to exertion from going to bathroom to bedside. Respirations approximately 26-28. Scheduled breathing treatment given. Diminished breath sounds with slight upper airway wheeze. Patient sitting on side of bed talking to nurse in somewhat complete sentences. RN at bedside.

## 2018-10-07 NOTE — ED Triage Notes (Signed)
Pt to triage via wheelchair.  Pt has sob and chest pain.  Pt reports sob has worsened last 2 weeks. Pt on 2 liters oxygen.  Hx emphysema.  Pt alert

## 2018-10-07 NOTE — ED Notes (Signed)
Pt very restless and agitated about wait time for bed. Pt also upset because she is cold and dirty underwear were not thrown away. PT encouraged to stay with [place] and wait for a bed. PT recovered with blankets and dirty underwear thrown away at patient's request. Pt provided water and is calm at this point.

## 2018-10-07 NOTE — ED Notes (Signed)
ED TO INPATIENT HANDOFF REPORT  ED Nurse Name and Phone #: Laurel Dimmer  S Name/Age/Gender [name] [name] 85 y.o. female Room/Bed: ED31A/ED31A  Code Status   Code Status: Prior  Home/SNF/Other Home Patient oriented to: self, place and situation Is this baseline? Yes   Triage Complete: Triage complete  Chief Complaint referred by MD/Sob/chest pain  Triage Note Pt to triage via wheelchair.  Pt has sob and chest pain.  Pt reports sob has worsened last 2 weeks. Pt on 2 liters oxygen.  Hx emphysema.  Pt alert    Allergies Allergies  Allergen Reactions  . Shellfish Allergy   . Codeine     headache  . Prednisone     Altered Mental Status  . Tetracycline     Level of Care/Admitting Diagnosis ED Disposition    ED Disposition Condition Comment   Admit  Hospital Area: [facility] [[id number]]  Level of Care: Telemetry [5]  Diagnosis: Acute CHF [facility]) [[id number]]  Admitting Physician: Auburn Bilberry [[id number]]  Attending Physician: Auburn Bilberry [[id number]]  Estimated length of stay: past midnight tomorrow  Certification:: I certify this patient will need inpatient services for at least 2 midnights  PT Class (Do Not Modify): Inpatient [101]  PT Acc Code (Do Not Modify): Private [1]       B Medical/Surgery History Past Medical History:  Diagnosis Date  . Arthritis   . Back pain   . Chicken pox   . Chronic respiratory failure (HCC)   . COPD (chronic obstructive pulmonary disease) (HCC)   . Emphysema lung (HCC)   . Glaucoma   . Insomnia   . Nocturnal leg cramps   . Osteoarthritis   . Rhabdomyolysis 04/07/2016  . Urine incontinence    Past Surgical History:  Procedure Laterality Date  . APPENDECTOMY  1947  . BREAST SURGERY  1969   biopsy  . COLOSTOMY    . HIP FRACTURE SURGERY Right 2008  . VAGINAL HYSTERECTOMY  1975     A IV Location/Drains/Wounds Patient Lines/Drains/Airways Status   Active Line/Drains/Airways    Name:   Placement date:    Placement time:   Site:   Days:   Peripheral IV 10/07/18 Right Forearm   10/07/18    1606    Forearm   less than 1          Intake/Output Last 24 hours No intake or output data in the 24 hours ending 10/07/18 1743  Labs/Imaging Results for orders placed or performed during the hospital encounter of 10/07/18 (from the past 48 hour(s))  Basic metabolic panel     Status: Abnormal   Collection Time: 10/07/18  3:49 PM  Result Value Ref Range   Sodium 139 135 - 145 mmol/L   Potassium 4.1 3.5 - 5.1 mmol/L   Chloride 105 98 - 111 mmol/L   CO2 24 22 - 32 mmol/L   Glucose, Bld 142 (H) 70 - 99 mg/dL   BUN 13 8 - 23 mg/dL   Creatinine, Ser 0.22 0.44 - 1.00 mg/dL   Calcium 8.7 (L) 8.9 - 10.3 mg/dL   GFR calc non Af Amer >60 >60 mL/min   GFR calc Af Amer >60 >60 mL/min   Anion gap 10 5 - 15    Comment: Performed at [facility], [address] [place], [place] [address]  CBC     Status: None   Collection Time: 10/07/18  3:49 PM  Result Value Ref Range   WBC 6.4 4.0 -  10.5 K/uL   RBC 4.13 3.87 - 5.11 MIL/uL   Hemoglobin 12.8 12.0 - 15.0 g/dL   HCT 52.839.4 41.336.0 - 24.446.0 %   MCV 95.4 80.0 - 100.0 fL   MCH 31.0 26.0 - 34.0 pg   MCHC 32.5 30.0 - 36.0 g/dL   RDW 01.014.2 27.211.5 - 53.615.5 %   Platelets 185 150 - 400 K/uL   nRBC 0.0 0.0 - 0.2 %    Comment: Performed at [facility], [address] [place], [place] [address]  Troponin I - ONCE - STAT     Status: None   Collection Time: 10/07/18  3:49 PM  Result Value Ref Range   Troponin I <0.03 <0.03 ng/mL    Comment: Performed at [facility], [address] [place], [place] [address]   Dg Chest Port 1 View  Result Date: 10/07/2018 CLINICAL DATA:  Shortness of breath and chest pain. History of emphysema. EXAM: PORTABLE CHEST 1 VIEW COMPARISON:  01/29/2018 FINDINGS: Increase in pulmonary vascularity since the prior chest x-ray with additional interstitial septal thickening. The heart is mildly more prominent in size.  There may be a small left pleural effusion. Findings support congestive heart failure. No focal airspace consolidation or evidence pneumothorax. Methylmethacrylate again noted in adjacent midthoracic vertebral bodies after prior vertebral augmentation. IMPRESSION: Findings supporting congestive heart failure with increased pulmonary vascularity, interstitial septal thickening, increased prominence in heart size and small left pleural effusion. Electronically Signed   By: Irish LackGlenn  Yamagata M.D.   On: 10/07/2018 16:17    Pending Labs [employer] Labs (From admission, onward)    Start     Ordered   Signed and Held  Basic metabolic panel  Daily,   R     Signed and Held          Vitals/Pain Today's Vitals   10/07/18 1541 10/07/18 1542 10/07/18 1545 10/07/18 1616  BP:   (!) 150/99 (!) 138/115  Pulse: (!) 155   99  Resp: (!) 30   (!) 34  Temp: 97.7 F (36.5 C)     TempSrc: Oral     SpO2: 93%   97%  Weight:  53.1 kg    Height:  5\' 1"  (1.549 m)    PainSc: 4        Isolation Precautions No active isolations  Medications Medications  diltiazem (CARDIZEM) tablet 60 mg (has no administration in time range)  enoxaparin (LOVENOX) injection 55 mg (has no administration in time range)  levalbuterol (XOPENEX) nebulizer solution 1.25 mg (has no administration in time range)  budesonide (PULMICORT) nebulizer solution 0.25 mg (has no administration in time range)  sodium chloride flush (NS) 0.9 % injection 3 mL (3 mLs Intravenous Given 10/07/18 1715)  diltiazem (CARDIZEM) injection 20 mg (20 mg Intravenous Given 10/07/18 1608)  furosemide (LASIX) injection 40 mg (40 mg Intravenous Given 10/07/18 1704)    Mobility walks with person assist Moderate fall risk   Focused Assessments Cardiac Assessment Handoff:  Cardiac Rhythm: Atrial fibrillation Lab Results  Component Value Date   CKTOTAL 519 (H) 04/06/2016   CKMB 1.9 03/23/2013   TROPONINI <0.03 10/07/2018   No results found for: DDIMER Does  the Patient currently have chest pain? Yes     R Recommendations: See Admitting Provider Note  Report given to:   Additional Notes:  PT likes to sit at edge of bed.

## 2018-10-07 NOTE — ED Provider Notes (Signed)
[facility] Emergency Department Provider Note   ____________________________________________   I have reviewed the triage vital signs and the nursing notes.   HISTORY  Chief Complaint Shortness of Breath and Chest Pain   History limited by: Not Limited   HPI [name] [name] is a 85 y.o. female who presents to the emergency department today because of concern for shortness of breath. She has noticed some worsening shortness of breath over the past few months but it has been getting increasingly worse over the past few days. The patient does have a history of COPD and her pulmonologist has started her on a couple of new medications which have not given much relief. She denies any chest pain. Additionally she has noticed some swelling in her ankles. The sob is worse with lying flat.    Records reviewed. Per medical record review patient has a history of COPD  Past Medical History:  Diagnosis Date  . Arthritis   . Back pain   . Chicken pox   . Chronic respiratory failure (HCC)   . COPD (chronic obstructive pulmonary disease) (HCC)   . Emphysema lung (HCC)   . Glaucoma   . Insomnia   . Nocturnal leg cramps   . Osteoarthritis   . Rhabdomyolysis 04/07/2016  . Urine incontinence     Patient Active Problem List   Diagnosis Date Noted  . Bilateral leg numbness 09/14/2018  . Constipation 03/19/2018  . Bilateral leg pain 03/19/2018  . Acalculous cholecystitis 04/07/2016  . Fall 04/07/2016  . Glaucoma 11/29/2014  . Insomnia 11/29/2014  . Arthritis, degenerative 11/29/2014  . Dependence on supplemental oxygen 11/29/2014  . Fatigue 11/29/2014  . COPD (chronic obstructive pulmonary disease) (HCC) 11/29/2013  . Chronic hypoxemic respiratory failure (HCC) 11/29/2013    Past Surgical History:  Procedure Laterality Date  . APPENDECTOMY  1947  . BREAST SURGERY  1969   biopsy  . COLOSTOMY    . HIP FRACTURE SURGERY Right 2008  . VAGINAL HYSTERECTOMY  1975     Prior to Admission medications   Medication Sig Start Date End Date Taking? Authorizing Provider  albuterol (PROAIR HFA) 108 (90 Base) MCG/ACT inhaler Inhale 2 puffs into the lungs every 6 (six) hours as needed for wheezing or shortness of breath. Patient not taking: Reported on 09/13/2018 05/29/17   Malva LimesFisher, Donald E, MD  arformoterol (BROVANA) 15 MCG/2ML NEBU Take 2 mLs (15 mcg total) by nebulization 2 (two) times daily. Patient not taking: Reported on 09/13/2018 03/18/18   Tracey HarriesGuse, Lauren M, FNP  aspirin 325 MG tablet Take 325 mg by mouth every other day as needed.    [provider]  bisacodyl (DULCOLAX) 5 MG EC tablet Take 1 tablet (5 mg total) by mouth daily as needed for moderate constipation. 03/18/18   Tracey HarriesGuse, Lauren M, FNP  cholecalciferol (VITAMIN D) 1000 UNITS tablet Take 1,000 Units by mouth daily.    [provider]  COMBIVENT RESPIMAT 20-100 MCG/ACT AERS respimat  08/27/18   [provider]  docusate sodium (COLACE) 100 MG capsule Take 2 capsules (200 mg total) by mouth daily. Patient not taking: Reported on 09/13/2018 03/18/18   Tracey HarriesGuse, Lauren M, FNP  dorzolamide (TRUSOPT) 2 % ophthalmic solution Place 1 drop into both eyes 3 (three) times daily.  10/31/14   [provider]  Ipratropium-Albuterol (COMBIVENT) 20-100 MCG/ACT AERS respimat Inhale into the lungs. 08/27/18 08/27/19  [provider]  latanoprost (XALATAN) 0.005 % ophthalmic solution Place 1 drop into both eyes  at bedtime.    [provider]  levalbuterol Pauline Aus) 1.25 MG/3ML nebulizer solution Inhale into the lungs. 08/27/18 08/27/19  [provider]  Multiple Vitamins-Minerals (CENTRUM SILVER ADULT 50+ PO) Take 1 tablet by mouth daily.    [provider]    Allergies Shellfish allergy; Codeine; Prednisone; and Tetracycline  Family History  Problem Relation Age of Onset  . Cancer Sister        cervical  . Cerebral aneurysm Father        reason for death     Social History Social History   Tobacco Use  . Smoking status: Former Smoker    Packs/day: 1.00    Years: 50.00    Pack years: 50.00    Types: Cigarettes    Last attempt to quit: 06/30/2000    Years since quitting: 18.2  . Smokeless tobacco: Never Used  Substance Use Topics  . Alcohol use: No  . Drug use: No    Review of Systems Constitutional: No fever/chills Eyes: No visual changes. ENT: No sore throat. Cardiovascular: Denies chest pain. Respiratory: Positive for shortness of breath.  Gastrointestinal: No abdominal pain.  No nausea, no vomiting.  No diarrhea.   Genitourinary: Negative for dysuria. Musculoskeletal: Positive for right shoulder pain with deep breaths. Positive for ankle swelling.  Skin: Negative for rash. Neurological: Negative for headaches, focal weakness or numbness.  ____________________________________________   PHYSICAL EXAM:  VITAL SIGNS: ED Triage Vitals  Enc Vitals Group     BP 10/07/18 1545 (!) 150/99     Pulse Rate 10/07/18 1541 (!) 155     Resp 10/07/18 1541 (!) 30     Temp 10/07/18 1541 97.7 F (36.5 C)     Temp Source 10/07/18 1541 Oral     SpO2 10/07/18 1541 93 %     Weight 10/07/18 1542 117 lb (53.1 kg)     Height 10/07/18 1542 5\' 1"  (1.549 m)     Head Circumference --      Peak Flow --      Pain Score 10/07/18 1541 4   Constitutional: Alert and oriented.  Eyes: Conjunctivae are normal.  ENT      Head: Normocephalic and atraumatic.      Nose: No congestion/rhinnorhea.      Mouth/Throat: Mucous membranes are moist.      Neck: No stridor. Hematological/Lymphatic/Immunilogical: No cervical lymphadenopathy. Cardiovascular: Irregularly irregular rhythm.  Respiratory: Tachypnea. Diminished breath sounds in lower lungs.  Gastrointestinal: Soft and non tender. No rebound. No guarding.  Genitourinary: Deferred Musculoskeletal: Normal range of motion in all extremities. 1 + swelling to bilateral ankles.  Neurologic:  Normal  speech and language. No gross focal neurologic deficits are appreciated.  Skin:  Skin is warm, dry and intact. No rash noted. Psychiatric: Mood and affect are normal. Speech and behavior are normal. Patient exhibits appropriate insight and judgment.  ____________________________________________    LABS (pertinent positives/negatives)  CBC wbc 6.4, hgb 12.8, plt 185 BMP wnl except glu 142, ca 8.7 Trop <0.03 ____________________________________________   EKG  I, Phineas Semen, attending physician, personally viewed and interpreted this EKG  EKG Time: 1545 Rate: 145 Rhythm: atrial fibrillation with RVR Axis: normal Intervals: qtc 441 QRS: narrow ST changes: no st elevation Impression: abnormal ekg  I, Phineas Semen, attending physician, personally viewed and interpreted this EKG  EKG Time: 1632 Rate: 89 Rhythm: atrial fibrillation Axis: normal Intervals: qtc 442 QRS: narrow ST changes: no st elevation Impression: abnormal ekg  ____________________________________________  RADIOLOGY  CXR Findings consistent with pulmonary edema.    ____________________________________________   PROCEDURES  Procedures  CRITICAL CARE Performed by: Phineas Semen   Total critical care time: 30 minutes  Critical care time was exclusive of separately billable procedures and treating other patients.  Critical care was necessary to treat or prevent imminent or life-threatening deterioration.  Critical care was time spent personally by me on the following activities: development of treatment plan with patient and/or surrogate as well as nursing, discussions with consultants, evaluation of patient's response to treatment, examination of patient, obtaining history from patient or surrogate, ordering and performing treatments and interventions, ordering and review of laboratory studies, ordering and review of radiographic studies, pulse oximetry and re-evaluation of patient's  condition.  ____________________________________________   INITIAL IMPRESSION / ASSESSMENT AND PLAN / ED COURSE  Pertinent labs & imaging results that were available during my care of the patient were reviewed by me and considered in my medical decision making (see chart for details).   Patient presented because of concern for shortness of breath. First ekg consistent with afib with rvr. Patient denies any history of afib. Given that patient was not hypotensive she was given bolus of diltiazem which did help decrease her rate. She did feel improvement with this decrease in heart rate. CXR is consistent with heart failure which could explain the afib. It would also be consistent with patient's symptoms of shortness of breath. Discussed both the afib and heart failure with the patient. Do feel she would benefit from inpatient admission.    ____________________________________________   FINAL CLINICAL IMPRESSION(S) / ED DIAGNOSES  Final diagnoses:  SOB (shortness of breath)     Note: This dictation was prepared with Dragon dictation. Any transcriptional errors that result from this process are unintentional     Phineas Semen, MD 10/07/18 1658

## 2018-10-07 NOTE — H&P (Signed)
[employer] - [place] [facility]   PATIENT NAME: [name] [name]    MR#:  [id number]  DATE OF BIRTH:  Mar 30, 1930  DATE OF ADMISSION:  10/07/2018  PRIMARY CARE PHYSICIAN: Tracey HarriesGuse, Lauren M, FNP   REQUESTING/REFERRING PHYSICIAN: Myrlene BrokerGoodman Gradydon MD  CHIEF COMPLAINT:   Chief Complaint  Patient presents with  . Shortness of Breath  . Chest Pain    HISTORY OF PRESENT ILLNESS: [name] Peeksther Lucero  is a 85 y.o. female with a known history of COPD, glaucoma, chronic respiratory failure on oxygen she is now 100% sure she thinks she may be on 5 L oxygen however in the ER she is requiring 2 L oxygen presenting with worsening shortness of breath.  Patient states that her shortness of breath has gotten worse over the past few months.  She has been seen by pulmonary as outpatient.  She is presenting now with worsening shortness of breath for the past 2 days.  She is noticed that she is gotten swelling in her lower extremity.  Patient also is unable to lay flat.  She also has had wheezing.  Also has felt chest pressure.  Evaluation in the emergency room reveals her to have acute CHF.  Patient also noticed to have new atrial fibrillation in the emergency room. PAST MEDICAL HISTORY:   Past Medical History:  Diagnosis Date  . Arthritis   . Back pain   . Chicken pox   . Chronic respiratory failure (HCC)   . COPD (chronic obstructive pulmonary disease) (HCC)   . Emphysema lung (HCC)   . Glaucoma   . Insomnia   . Nocturnal leg cramps   . Osteoarthritis   . Rhabdomyolysis 04/07/2016  . Urine incontinence     PAST SURGICAL HISTORY:  Past Surgical History:  Procedure Laterality Date  . APPENDECTOMY  1947  . BREAST SURGERY  1969   biopsy  . COLOSTOMY    . HIP FRACTURE SURGERY Right 2008  . VAGINAL HYSTERECTOMY  1975    SOCIAL HISTORY:  Social History   Tobacco Use  . Smoking status: Former Smoker    Packs/day: 1.00    Years: 50.00    Pack years: 50.00    Types: Cigarettes    Last  attempt to quit: 06/30/2000    Years since quitting: 18.2  . Smokeless tobacco: Never Used  Substance Use Topics  . Alcohol use: No    FAMILY HISTORY:  Family History  Problem Relation Age of Onset  . Cancer Sister        cervical  . Cerebral aneurysm Father        reason for death    DRUG ALLERGIES:  Allergies  Allergen Reactions  . Shellfish Allergy   . Codeine     headache  . Prednisone     Altered Mental Status  . Tetracycline     REVIEW OF SYSTEMS:   CONSTITUTIONAL: No fever, fatigue or weakness.  EYES: No blurred or double vision.  EARS, NOSE, AND THROAT: No tinnitus or ear pain.  RESPIRATORY: Positive cough, positive shortness of breath, positive wheezing or hemoptysis.  CARDIOVASCULAR: No chest pain, orthopnea, positive edema.  GASTROINTESTINAL: No nausea, vomiting, diarrhea or abdominal pain.  GENITOURINARY: No dysuria, hematuria.  ENDOCRINE: No polyuria, nocturia,  HEMATOLOGY: No anemia, easy bruising or bleeding SKIN: No rash or lesion. MUSCULOSKELETAL: No joint pain or arthritis.   NEUROLOGIC: No tingling, numbness, weakness.  PSYCHIATRY: No anxiety or depression.   MEDICATIONS AT HOME:  Prior to  Admission medications   Medication Sig Start Date End Date Taking? Authorizing Provider  albuterol (PROAIR HFA) 108 (90 Base) MCG/ACT inhaler Inhale 2 puffs into the lungs every 6 (six) hours as needed for wheezing or shortness of breath. Patient not taking: Reported on 09/13/2018 05/29/17   Malva Limes, MD  arformoterol (BROVANA) 15 MCG/2ML NEBU Take 2 mLs (15 mcg total) by nebulization 2 (two) times daily. Patient not taking: Reported on 09/13/2018 03/18/18   Tracey Harries, FNP  aspirin 325 MG tablet Take 325 mg by mouth every other day as needed.    [provider]  bisacodyl (DULCOLAX) 5 MG EC tablet Take 1 tablet (5 mg total) by mouth daily as needed for moderate constipation. 03/18/18   Tracey Harries, FNP  cholecalciferol (VITAMIN D) 1000 UNITS  tablet Take 1,000 Units by mouth daily.    [provider]  COMBIVENT RESPIMAT 20-100 MCG/ACT AERS respimat  08/27/18   [provider]  docusate sodium (COLACE) 100 MG capsule Take 2 capsules (200 mg total) by mouth daily. Patient not taking: Reported on 09/13/2018 03/18/18   Tracey Harries, FNP  dorzolamide (TRUSOPT) 2 % ophthalmic solution Place 1 drop into both eyes 3 (three) times daily.  10/31/14   [provider]  Ipratropium-Albuterol (COMBIVENT) 20-100 MCG/ACT AERS respimat Inhale into the lungs. 08/27/18 08/27/19  [provider]  latanoprost (XALATAN) 0.005 % ophthalmic solution Place 1 drop into both eyes at bedtime.    [provider]  levalbuterol Pauline Aus) 1.25 MG/3ML nebulizer solution Inhale into the lungs. 08/27/18 08/27/19  [provider]  Multiple Vitamins-Minerals (CENTRUM SILVER ADULT 50+ PO) Take 1 tablet by mouth daily.    [provider]      PHYSICAL EXAMINATION:   VITAL SIGNS: Blood pressure (!) 138/115, pulse 99, temperature 97.7 F (36.5 C), temperature source Oral, resp. rate (!) 34, height 5\' 1"  (1.549 m), weight 53.1 kg, SpO2 97 %.  GENERAL:  85 y.o.-year-old patient lying in the bed mild respiratory distress EYES: Pupils equal, round, reactive to light and accommodation. No scleral icterus. Extraocular muscles intact.  HEENT: Head atraumatic, normocephalic. Oropharynx and nasopharynx clear.  NECK:  Supple, no jugular venous distention. No thyroid enlargement, no tenderness.  LUNGS: Crackles at the bases bilaterally CARDIOVASCULAR: Regularly irregular ABDOMEN: Soft, nontender, nondistended. Bowel sounds present. No organomegaly or mass.  EXTREMITIES: 2+ pedal edema, cyanosis, or clubbing.  NEUROLOGIC: Cranial nerves II through XII are intact. Muscle strength 5/5 in all extremities. Sensation intact. Gait not checked.  PSYCHIATRIC: The patient is alert and oriented x 3.  SKIN: No obvious rash, lesion, or  ulcer.   LABORATORY PANEL:   CBC Recent Labs  Lab 10/07/18 1549  WBC 6.4  HGB 12.8  HCT 39.4  PLT 185  MCV 95.4  MCH 31.0  MCHC 32.5  RDW 14.2   ------------------------------------------------------------------------------------------------------------------  Chemistries  Recent Labs  Lab 10/07/18 1549  NA 139  K 4.1  CL 105  CO2 24  GLUCOSE 142*  BUN 13  CREATININE 0.67  CALCIUM 8.7*   ------------------------------------------------------------------------------------------------------------------ estimated creatinine clearance is 38.1 mL/min (by C-G formula based on SCr of 0.67 mg/dL). ------------------------------------------------------------------------------------------------------------------ No results for input(s): TSH, T4TOTAL, T3FREE, THYROIDAB in the last 72 hours.  Invalid input(s): FREET3   Coagulation profile No results for input(s): INR, PROTIME in the last 168 hours. ------------------------------------------------------------------------------------------------------------------- No results for input(s): DDIMER in the last 72 hours. -------------------------------------------------------------------------------------------------------------------  Cardiac Enzymes Recent Labs  Lab 10/07/18 1549  TROPONINI <  0.03   ------------------------------------------------------------------------------------------------------------------ Invalid input(s): POCBNP  ---------------------------------------------------------------------------------------------------------------  Urinalysis    Component Value Date/Time   COLORURINE YELLOW (A) 04/04/2016 1251   APPEARANCEUR CLEAR (A) 04/04/2016 1251   LABSPEC 1.020 04/04/2016 1251   PHURINE 5.0 04/04/2016 1251   GLUCOSEU NEGATIVE 04/04/2016 1251   HGBUR 1+ (A) 04/04/2016 1251   BILIRUBINUR Neg 10/27/2017 1621   KETONESUR 2+ (A) 04/04/2016 1251   PROTEINUR Neg 10/27/2017 1621   PROTEINUR 30 (A)  04/04/2016 1251   UROBILINOGEN 0.2 10/27/2017 1621   NITRITE Neg 10/27/2017 1621   NITRITE NEGATIVE 04/04/2016 1251   LEUKOCYTESUR Negative 10/27/2017 1621     RADIOLOGY: Dg Chest Port 1 View  Result Date: 10/07/2018 CLINICAL DATA:  Shortness of breath and chest pain. History of emphysema. EXAM: PORTABLE CHEST 1 VIEW COMPARISON:  01/29/2018 FINDINGS: Increase in pulmonary vascularity since the prior chest x-ray with additional interstitial septal thickening. The heart is mildly more prominent in size. There may be a small left pleural effusion. Findings support congestive heart failure. No focal airspace consolidation or evidence pneumothorax. Methylmethacrylate again noted in adjacent midthoracic vertebral bodies after prior vertebral augmentation. IMPRESSION: Findings supporting congestive heart failure with increased pulmonary vascularity, interstitial septal thickening, increased prominence in heart size and small left pleural effusion. Electronically Signed   By: Irish Lack M.D.   On: 10/07/2018 16:17    EKG: Orders placed or performed during the hospital encounter of 10/07/18  . ED EKG within 10 minutes  . ED EKG within 10 minutes  . EKG 12-Lead  . EKG 12-Lead  . EKG 12-Lead  . EKG 12-Lead    IMPRESSION AND PLAN:  Patient is 85 year old presenting with worsening shortness of breath and cough  1.  Acute CHF type unknown new onset We will obtain echocardiogram of the heart Treat with IV Lasix Cardiology consult Start low-dose beta-blocker and ACE inhibitor  2.  Atrial fibrillation new onset heart rate now improved after 1 dose of IV Cardizem I will continue oral Cardizem Full dose Lovenox for now  3.  COPD with acute exacerbation I will treat with Xopenex and Pulmicort nebs patient's has allergy to prednisone hold Solu-Medrol for now  4.  Glaucoma continue eyedrops  5, DVT prophylaxis with full dose Lovenox    All the records are reviewed and case discussed with  ED provider. Management plans discussed with the patient, family and they are in agreement.  CODE STATUS: Code Status History    Date Active Date Inactive Code Status Order ID Comments User Context   04/04/2016 1503 04/08/2016 1451 Full Code [id number]  Milagros Loll, MD ED    Advance Directive Documentation     Most Recent Value  Type of Advance Directive  Living will  Pre-existing out of facility DNR order (yellow form or pink MOST form)  -  "MOST" Form in Place?  -       TOTAL TIME TAKING CARE OF THIS PATIENT:55 minutes.    Auburn Bilberry M.D on 10/07/2018 at 5:10 PM  Between 7am to 6pm - Pager - [phone]  After 6pm go to [email or web address] - password [email or web address]  [employer] Office  [phone]  CC: Primary care physician; Tracey Harries, FNP

## 2018-10-07 NOTE — ED Notes (Addendum)
Pt placed on cardiac monitor and resting in bed on 2L [place]. MD and RNs made aware pt was in room. Blood work sent and Xray at bedside.

## 2018-10-08 ENCOUNTER — Other Ambulatory Visit: Payer: Self-pay

## 2018-10-08 ENCOUNTER — Inpatient Hospital Stay
Admit: 2018-10-08 | Discharge: 2018-10-08 | Disposition: A | Discharge status: Home / Self Care | Payer: Medicare Other | Attending: Internal Medicine | Admitting: Internal Medicine

## 2018-10-08 LAB — BASIC METABOLIC PANEL
Anion gap: 8 (ref 5–15)
BUN: 14 mg/dL (ref 8–23)
CO2: 27 mmol/L (ref 22–32)
Calcium: 8.8 mg/dL — ABNORMAL LOW (ref 8.9–10.3)
Chloride: 102 mmol/L (ref 98–111)
Creatinine, Ser: 0.65 mg/dL (ref 0.44–1.00)
GFR calc Af Amer: >60 mL/min (ref >60)
GFR calc non Af Amer: >60 mL/min (ref >60)
Glucose, Bld: 122 mg/dL — ABNORMAL HIGH (ref 70–99)
Potassium: 3.2 mmol/L — ABNORMAL LOW (ref 3.5–5.1)
Sodium: 137 mmol/L (ref 135–145)

## 2018-10-08 LAB — ECHOCARDIOGRAM COMPLETE
Height: 61 in
Weight: 1872 oz

## 2018-10-08 MED ORDER — POTASSIUM CHLORIDE CRYS ER 20 MEQ PO TBCR
40.0000 meq | EXTENDED_RELEASE_TABLET | Freq: Two times a day (BID) | ORAL | Status: AC
  Administered 2018-10-08 (×2): 40 meq via ORAL
  Filled 2018-10-08 (×2): qty 2

## 2018-10-08 MED ORDER — METOPROLOL TARTRATE 50 MG PO TABS
50.0000 mg | ORAL_TABLET | Freq: Two times a day (BID) | ORAL | Status: DC
  Administered 2018-10-08: 50 mg via ORAL
  Filled 2018-10-08 (×2): qty 1

## 2018-10-08 NOTE — Consult Note (Signed)
[facility] Cardiology Consultation Note  Patient ID: [name] [name], MRN: [id number], DOB/AGE: Jan 11, 1930 85 y.o. Admit date: 10/07/2018   Date of Consult: 10/08/2018 Primary Physician: Tracey HarriesGuse, Lauren M, FNP Primary Cardiologist: None  Chief Complaint:  Chief Complaint  Patient presents with  . Shortness of Breath  . Chest Pain   Reason for Consult: Shortness of breath  HPI: 85 y.o. female with known chronic obstructive pulmonary disease on load flow oxygen for chronic COPD concerns for which the patient has had significant progression of shortness of breath over the last several days.  She got a albuterol inhaler and other treatments but this did not help.  Then she had progression of tightness in her chest and shortness of breath as well and was seen in the emergency room.  At that time she had a chest x-ray showing left pleural effusion and mild pulmonary edema and hypoxia.  She was given more oxygen and intravenous Lasix which helped a great deal.  At this time she has significant improvements in symptoms and has no more chest pain or pressure.  Troponin level was normal consistent with no evidence of myocardial infarction and she did have an EKG showing atrial fibrillation with controlled ventricular rate.  She was given diltiazem for heart rate control which worked although there was some concerns whether this is systolic dysfunction heart failure and needing beta-blocker instead.  Therefore changing beta-blocker to metoprolol for heart rate control until possible spontaneous conversion to normal rhythm may work.  Currently she is hemodynamically stable  Past Medical History:  Diagnosis Date  . Arthritis   . Back pain   . Chicken pox   . Chronic respiratory failure (HCC)   . COPD (chronic obstructive pulmonary disease) (HCC)   . Emphysema lung (HCC)   . Glaucoma   . Insomnia   . Nocturnal leg cramps   . Osteoarthritis   . Rhabdomyolysis 04/07/2016  . Urine incontinence        Surgical History:  Past Surgical History:  Procedure Laterality Date  . APPENDECTOMY  1947  . BREAST SURGERY  1969   biopsy  . COLOSTOMY    . HIP FRACTURE SURGERY Right 2008  . VAGINAL HYSTERECTOMY  1975     Home Meds: Prior to Admission medications   Medication Sig Start Date End Date Taking? Authorizing Provider  albuterol (PROAIR HFA) 108 (90 Base) MCG/ACT inhaler Inhale 2 puffs into the lungs every 6 (six) hours as needed for wheezing or shortness of breath. Patient not taking: Reported on 09/13/2018 05/29/17   Malva LimesFisher, Donald E, MD  arformoterol (BROVANA) 15 MCG/2ML NEBU Take 2 mLs (15 mcg total) by nebulization 2 (two) times daily. Patient not taking: Reported on 09/13/2018 03/18/18   Tracey HarriesGuse, Lauren M, FNP  aspirin 325 MG tablet Take 325 mg by mouth every other day as needed.    [provider]  bisacodyl (DULCOLAX) 5 MG EC tablet Take 1 tablet (5 mg total) by mouth daily as needed for moderate constipation. 03/18/18   Tracey HarriesGuse, Lauren M, FNP  cholecalciferol (VITAMIN D) 1000 UNITS tablet Take 1,000 Units by mouth daily.    [provider]  COMBIVENT RESPIMAT 20-100 MCG/ACT AERS respimat  08/27/18   [provider]  docusate sodium (COLACE) 100 MG capsule Take 2 capsules (200 mg total) by mouth daily. Patient not taking: Reported on 09/13/2018 03/18/18   Tracey HarriesGuse, Lauren M, FNP  dorzolamide (TRUSOPT) 2 % ophthalmic solution Place 1 drop into both eyes 3 (three)  times daily.  10/31/14   [provider]  Ipratropium-Albuterol (COMBIVENT) 20-100 MCG/ACT AERS respimat Inhale into the lungs. 08/27/18 08/27/19  [provider]  latanoprost (XALATAN) 0.005 % ophthalmic solution Place 1 drop into both eyes at bedtime.    [provider]  levalbuterol Pauline Aus) 1.25 MG/3ML nebulizer solution Inhale into the lungs. 08/27/18 08/27/19  [provider]  Multiple Vitamins-Minerals (CENTRUM SILVER ADULT 50+ PO) Take 1 tablet by mouth daily.    [provider]    Inpatient Medications:  . budesonide (PULMICORT) nebulizer solution  0.25 mg Nebulization BID  . dorzolamide  1 drop Both Eyes TID  . enoxaparin (LOVENOX) injection  1 mg/kg Subcutaneous Q12H  . furosemide  40 mg Intravenous BID  . latanoprost  1 drop Both Eyes QHS  . levalbuterol  1.25 mg Nebulization Q8H  . lisinopril  2.5 mg Oral Daily  . metoprolol tartrate  50 mg Oral BID  . potassium chloride  40 mEq Oral BID  . sodium chloride flush  3 mL Intravenous Q12H   . sodium chloride      Allergies:  Allergies  Allergen Reactions  . Shellfish Allergy   . Codeine     headache  . Prednisone     Altered Mental Status  . Tetracycline     Social History   Socioeconomic History  . Marital status: Single    Spouse name: Not on file  . Number of children: 2  . Years of education: Not on file  . Highest education level: Not on file  Occupational History  . Not on file  Social Needs  . Financial resource strain: Not on file  . Food insecurity:    Worry: Not on file    Inability: Not on file  . Transportation needs:    Medical: Not on file    Non-medical: Not on file  Tobacco Use  . Smoking status: Former Smoker    Packs/day: 1.00    Years: 50.00    Pack years: 50.00    Types: Cigarettes    Last attempt to quit: 06/30/2000    Years since quitting: 18.2  . Smokeless tobacco: Never Used  Substance and Sexual Activity  . Alcohol use: No  . Drug use: No  . Sexual activity: Not Currently  Lifestyle  . Physical activity:    Days per week: Not on file    Minutes per session: Not on file  . Stress: Not on file  Relationships  . Social connections:    Talks on phone: Not on file    Gets together: Not on file    Attends religious service: Not on file    Active member of club or organization: Not on file    Attends meetings of clubs or organizations: Not on file    Relationship status: Not on file  . Intimate partner violence:    Fear of current or ex  partner: Not on file    Emotionally abused: Not on file    Physically abused: Not on file    Forced sexual activity: Not on file  Other Topics Concern  . Not on file  Social History Narrative  . Not on file     Family History  Problem Relation Age of Onset  . Cancer Sister        cervical  . Cerebral aneurysm Father        reason for death     Review of Systems Positive for cough congestion  shortness of breath chest pain Negative for: General:  chills, fever, night sweats or weight changes.  Cardiovascular: PND orthopnea syncope dizziness  Dermatological skin lesions rashes Respiratory: Positive for cough congestion Urologic: Frequent urination urination at night and hematuria Abdominal: negative for nausea, vomiting, diarrhea, bright red blood per rectum, melena, or hematemesis Neurologic: negative for visual changes, and/or hearing changes  All other systems reviewed and are otherwise negative except as noted above.  Labs: Recent Labs    10/07/18 1549  TROPONINI <0.03   Lab Results  Component Value Date   WBC 6.4 10/07/2018   HGB 12.8 10/07/2018   HCT 39.4 10/07/2018   MCV 95.4 10/07/2018   PLT 185 10/07/2018    Recent Labs  Lab 10/08/18 0433  NA 137  K 3.2*  CL 102  CO2 27  BUN 14  CREATININE 0.65  CALCIUM 8.8*  GLUCOSE 122*   No results found for: CHOL, HDL, LDLCALC, TRIG No results found for: DDIMER  Radiology/Studies:  Dg Chest Port 1 View  Result Date: 10/07/2018 CLINICAL DATA:  Shortness of breath and chest pain. History of emphysema. EXAM: PORTABLE CHEST 1 VIEW COMPARISON:  01/29/2018 FINDINGS: Increase in pulmonary vascularity since the prior chest x-ray with additional interstitial septal thickening. The heart is mildly more prominent in size. There may be a small left pleural effusion. Findings support congestive heart failure. No focal airspace consolidation or evidence pneumothorax. Methylmethacrylate again noted in adjacent midthoracic  vertebral bodies after prior vertebral augmentation. IMPRESSION: Findings supporting congestive heart failure with increased pulmonary vascularity, interstitial septal thickening, increased prominence in heart size and small left pleural effusion. Electronically Signed   By: Irish Lack M.D.   On: 10/07/2018 16:17   Vas [place] Vanice Sarah With/wo Tbi  Result Date: 09/16/2018 LOWER EXTREMITY DOPPLER STUDY Indications: Leg numbness right >left              COPD.  Performing Technologist: Salvadore Farber RVT  Examination Guidelines: A complete evaluation includes at minimum, Doppler waveform signals and systolic blood pressure reading at the level of bilateral brachial, anterior tibial, and posterior tibial arteries, when vessel segments are accessible. Bilateral testing is considered an integral part of a complete examination. Photoelectric Plethysmograph (PPG) waveforms and toe systolic pressure readings are included as required and additional duplex testing as needed. Limited examinations for reoccurring indications may be performed as noted.  ABI Findings: +--------+------------------+-----+---------+--------+ Right   Rt Pressure (mmHg)IndexWaveform Comment  +--------+------------------+-----+---------+--------+ [id number]                                      +--------+------------------+-----+---------+--------+ ATA     156               0.98 triphasic         +--------+------------------+-----+---------+--------+ PTA     161               1.01 triphasic         +--------+------------------+-----+---------+--------+ +--------+------------------+-----+---------+-------+ Left    Lt Pressure (mmHg)IndexWaveform Comment +--------+------------------+-----+---------+-------+ [id number]                                     +--------+------------------+-----+---------+-------+ ATA     161               1.01 triphasic        +--------+------------------+-----+---------+-------+ PTA  158               0.99 triphasic        +--------+------------------+-----+---------+-------+ +-------+-----------+-----------+------------+------------+ ABI/TBIToday's ABIToday's TBIPrevious ABIPrevious TBI +-------+-----------+-----------+------------+------------+ Right  1.01                                           +-------+-----------+-----------+------------+------------+ Left   1.01                                           +-------+-----------+-----------+------------+------------+ Significant cardiac arrythmia noted by doppler.  Summary: Right: Resting right ankle-brachial index is within normal range. No evidence of significant right lower extremity arterial disease. Left: Resting left ankle-brachial index is within normal range. No evidence of significant left lower extremity arterial disease.  *See table(s) above for measurements and observations.  Electronically signed by Levora Dredge MD on 09/16/2018 at 4:33:03 PM.   Final     EKG: Atrial fibrillation with controlled ventricular rate nonspecific ST changes  Weights: Filed Weights   10/07/18 1542  Weight: 53.1 kg     Physical Exam: Blood pressure 125/75, pulse (!) 108, temperature 98 F (36.7 C), temperature source Oral, resp. rate 20, height  (1.549 m), weight 53.1 kg, SpO2 98 %. Body mass index is 22.11 kg/m. Physical exam as noted per prime doc    Assessment: 85 year old female with chronic obstructive pulmonary disease with apparent mild exacerbation as well as acute congestive heart failure along with atrial fibrillation with controlled ventricular rate without evidence of myocardial infarction improved with current medical regimen  Plan: 1.  Change carvedilol Cardizem combination for heart rate control and congestive heart failure to metoprolol 50 mg twice per day until further evaluation to assess systolic or diastolic causes 2.  Continue anticoagulation for further risk reduction in stroke  with atrial fibrillation with heparin and then consider changing to Eliquis for further risk reduction in stroke with atrial fibrillation 3.  Echocardiogram for LV systolic dysfunction valvular heart disease and further adjustments of medication 4.  Oxygenation as well as further treatment for possible exacerbation of COPD 5.  Lasix for pulmonary edema 6.  Begin ambulation and follow for improvements of symptoms and need for further adjustments  Signed, Lamar Blinks M.D. [facility] Cardiology 10/08/2018, 10:06 AM

## 2018-10-08 NOTE — Progress Notes (Addendum)
[employer] - [place] [facility]   PATIENT NAME: [name] [name]    MR#:  [id number]  DATE OF BIRTH:  1930-05-29  SUBJECTIVE:   Patient states she is feeling really good this morning.  She denies any shortness of breath.  She states she was able to get up and walk to the bathroom without feeling short of breath or dizzy.  She denies any chest pain or palpitations.  REVIEW OF SYSTEMS:  Review of Systems  Constitutional: Negative for chills and fever.  HENT: Negative for congestion and sore throat.   Eyes: Negative for blurred vision and double vision.  Respiratory: Negative for cough and shortness of breath.   Cardiovascular: Negative for chest pain and palpitations.  Gastrointestinal: Negative for nausea and vomiting.  Genitourinary: Negative for dysuria and urgency.  Musculoskeletal: Negative for back pain and neck pain.  Neurological: Negative for dizziness and headaches.  Psychiatric/Behavioral: Negative for depression. The patient is not nervous/anxious.     DRUG ALLERGIES:   Allergies  Allergen Reactions  . Shellfish Allergy   . Codeine     headache  . Prednisone     Altered Mental Status  . Tetracycline    VITALS:  Blood pressure 106/67, pulse 71, temperature 98 F (36.7 C), temperature source Oral, resp. rate 20, height 5\' 1"  (1.549 m), weight 53.1 kg, SpO2 98 %. PHYSICAL EXAMINATION:  Physical Exam  GENERAL:  Laying in the bed with no acute distress.  Thin-appearing. HEENT: Head atraumatic, normocephalic. Pupils equal, round, reactive to light and accommodation. No scleral icterus. Extraocular muscles intact. Oropharynx and nasopharynx clear.  NECK:  Supple, no jugular venous distention. No thyroid enlargement. LUNGS: Lungs are clear to auscultation bilaterally. + Occasional wheezing. No use of accessory muscles of respiration. +Bancroft in place. CARDIOVASCULAR: Irregularly irregular rhythm, regular rate, S1, S2 normal. No murmurs, rubs, or gallops.   ABDOMEN: Soft, nontender, nondistended. Bowel sounds present.  EXTREMITIES: No cyanosis, or clubbing. + Edema of ankles bilaterally NEUROLOGIC: CN 2-12 intact, no focal deficits. +global weakness. Sensation intact throughout. Gait not checked.  PSYCHIATRIC: The patient is alert and oriented x 3.  SKIN: No obvious rash, lesion, or ulcer.  LABORATORY PANEL:  Female CBC Recent Labs  Lab 10/07/18 1549  WBC 6.4  HGB 12.8  HCT 39.4  PLT 185   ------------------------------------------------------------------------------------------------------------------ Chemistries  Recent Labs  Lab 10/08/18 0433  NA 137  K 3.2*  CL 102  CO2 27  GLUCOSE 122*  BUN 14  CREATININE 0.65  CALCIUM 8.8*   RADIOLOGY:  Dg Chest Port 1 View  Result Date: 10/07/2018 CLINICAL DATA:  Shortness of breath and chest pain. History of emphysema. EXAM: PORTABLE CHEST 1 VIEW COMPARISON:  01/29/2018 FINDINGS: Increase in pulmonary vascularity since the prior chest x-ray with additional interstitial septal thickening. The heart is mildly more prominent in size. There may be a small left pleural effusion. Findings support congestive heart failure. No focal airspace consolidation or evidence pneumothorax. Methylmethacrylate again noted in adjacent midthoracic vertebral bodies after prior vertebral augmentation. IMPRESSION: Findings supporting congestive heart failure with increased pulmonary vascularity, interstitial septal thickening, increased prominence in heart size and small left pleural effusion. Electronically Signed   By: Irish Lack M.D.   On: 10/07/2018 16:17   ASSESSMENT AND PLAN:   New onset acute CHF- unknown if systolic or diastolic. -Echo pending -Cardiology consulted -Continue IV Lasix -Continue metoprolol and lisinopril  New onset atrial fibrillation- rate-controlled. -Continue full dose Lovenox for now, with plans  to change to Eliquis -Continue metoprolol and diltiazem -Cardiology following   Acute exacerbation of COPD-improving -Continue Xopenex and Pulmicort nebs  Hypokalemia- secondary to Lasix -Replete and recheck  Glaucoma-stable -Continue eyedrops  DVT prophylaxis-full dose Lovenox  All the records are reviewed and case discussed with Care Management/Social Worker. Management plans discussed with the patient, family and they are in agreement.  CODE STATUS: DNR  TOTAL TIME TAKING CARE OF THIS PATIENT: 45 minutes.   More than 50% of the time was spent in counseling/coordination of care: YES  POSSIBLE D/C IN 1-2 DAYS, DEPENDING ON CLINICAL CONDITION.   Jinny BlossomKaty D Akshat Minehart M.D on 10/08/2018 at 1:26 PM  Between 7am to 6pm - Pager [phone]  After 6pm go to [email or web address] - Social research officer, governmentpassword [email or web address] [email or web address]  [employer]  Hospitalists  Office  [phone]  CC: Primary care physician; Tracey HarriesGuse, Lauren M, FNP  Note: This dictation was prepared with Dragon dictation along with smaller phrase technology. Any transcriptional errors that result from this process are unintentional.

## 2018-10-08 NOTE — Progress Notes (Signed)
*  PRELIMINARY RESULTS* Echocardiogram 2D Echocardiogram has been performed.  Cristela Blue 10/08/2018, 9:05 AM

## 2018-10-08 NOTE — Progress Notes (Signed)
Pt is noncompliant with safety measures. Pt instructed several times to use call bell and not get OOB/chair without assistance but pt continues to get OOB/chair without calling for help. Pt does have room near nurses station, all personal items close by, safety socks, and chair alarm is on.

## 2018-10-08 NOTE — Progress Notes (Signed)
Ch visited pt in regards to an OR received for prayer. Pt was being seen by the provider and nurse during the ch visit. Pt had a positive affect but is anxious to get back to her home which is a senior living community. Ch practiced active listening as the pt shared her struggles with her health and lack of family members being near her. Pt does enjoy her living community and the care she received their but her living family members are estranged. Pt is moderately independent w/ the help of O2 throughout the day. Pt shared that she had issues w/ breathing for about 3 weeks before coming in to the ER. Ch was present while pt was administered Lasix and K+ pill which the pt tolerated well w/ moderate itching at IV site and back area. RN helped the itching w/ lotion but the pt breathing is still somewhat labored. Pt reflected on her past and how she has overcome loss and health challenges. Pt was appreciative of ch visit. Ch provided words of comfort and prayer for pt.    10/08/18 0930  Clinical Encounter Type  Visited With Patient;Health care provider  Visit Type Psychological support;Spiritual support;Social support  Referral From Nurse  Consult/Referral To Chaplain  Spiritual Encounters  Spiritual Needs Prayer;Emotional;Grief support  Stress Factors  Patient Stress Factors Exhausted;Family relationships;Health changes;Major life changes  Family Stress Factors Family relationships;Major life changes

## 2018-10-08 NOTE — TOC Initial Note (Signed)
Transition of Care [facility](TOC) - Initial/Assessment Note    Patient Details  Name: [name] [name] MRN: [id number] Date of Birth: 11-22-1929  Transition of Care [facility](TOC) CM/SW Contact:    Sherren KernsJennifer L Milburn Freeney, RN Phone Number: 10/08/2018, 3:09 PM  Clinical Narrative:          [facility].  Admitted with SOB; swelling.  New CHF.  Patient is current with PCP; [facility].  She has a functioning scale and verbalizes understanding of daily weights.  Offered home health services.  No preference;  Referral made to [facility] for [facility], RN, PT and aide.  Protocol on chart; MD needs to sign and CM fax to [facility].  Patient uses a cane occasionally.  Currently receiving IV lasix and chronic oxygen @ 3L.  Patient states sometimes she uses 5L at home.  Will continue to assist with discharge planning as patient progresses.     Expected Discharge Plan: Home w Home Health Services Barriers to Discharge: Continued Medical Work up   Patient Goals and CMS Choice Patient states their goals for this hospitalization and ongoing recovery are:: discharge to home with home health; no preference CMS Medicare.gov Compare Post Acute Care list provided to:: Patient Choice offered to / list presented to : Patient  Expected Discharge Plan and Services Expected Discharge Plan: Home w Home Health Services   Discharge Planning Services: CM Consult Post Acute Care Choice: Home Health Living arrangements for the past 2 months: Independent Living Facility                     HH Arranged: RN, PT, Nurse's Aide([facility]) [facility]  Prior Living Arrangements/Services Living arrangements for the past 2 months: Independent Living Facility Lives with:: Self Patient language and need for interpreter reviewed:: Yes Do you feel safe going back to the place where you live?: Yes            Criminal Activity/Legal Involvement Pertinent to Current Situation/Hospitalization: No - Comment as needed  Activities of Daily Living Home Assistive Devices/Equipment: Oxygen ADL Screening (condition at time of admission) Patient's cognitive ability adequate to safely complete daily activities?: Yes Is the patient deaf or have difficulty hearing?: Yes(bilateral hearing aids) Does the patient have difficulty seeing, even when wearing glasses/contacts?: No Does the patient have difficulty concentrating, remembering, or making decisions?: No Patient able to express need for assistance with ADLs?: Yes Does the patient have difficulty dressing or bathing?: Yes Independently performs ADLs?: Yes (appropriate for developmental age) Does the patient have difficulty walking or climbing stairs?: No Weakness of Legs: Both Weakness of Arms/Hands: Both  Permission Sought/Granted Permission sought to share information with : Photographeracility Contact Representative       Permission granted to share info w AGENCY: [facility]        Emotional Assessment Appearance:: Appears stated age Attitude/Demeanor/Rapport: Gracious Affect (typically observed): Accepting Orientation: : Oriented to Self, Oriented to Place, Oriented to  Time, Oriented to Situation Alcohol / Substance Use: Not Applicable    Admission diagnosis:  Shortness of breath [R06.02] SOB (shortness of breath) [R06.02] Atrial fibrillation, unspecified type (HCC) [I48.91] Heart failure, unspecified HF chronicity, unspecified heart failure type [facility](HCC) [I50.9] Patient Active Problem List   Diagnosis Date Noted  . Acute CHF (HCC) 10/07/2018  . Bilateral leg numbness 09/14/2018  . Constipation 03/19/2018  . Bilateral leg pain 03/19/2018  .  Acalculous cholecystitis 04/07/2016  . Fall 04/07/2016  . Glaucoma 11/29/2014  . Insomnia 11/29/2014  . Arthritis, degenerative 11/29/2014  . Dependence on supplemental oxygen 11/29/2014  .  Fatigue 11/29/2014  . COPD (chronic obstructive pulmonary disease) (HCC) 11/29/2013  . Chronic hypoxemic respiratory failure (HCC) 11/29/2013   PCP:  Tracey Harries, FNP Pharmacy:   [facility] [place] - [address] [place] [address] [place] [address] Phone: [phone] Fax: [phone]  [facility] [address] [place], [place] - [address] [place] [address] Phone: [phone] Fax: [phone]     Social Determinants of Health (SDOH) Interventions    Readmission Risk Interventions Readmission Risk Prevention Plan 10/08/2018  Post Dischage Appt Complete  Medication Screening Complete  Transportation Screening Complete  Some recent data might be hidden

## 2018-10-09 ENCOUNTER — Other Ambulatory Visit: Payer: Self-pay | Admitting: Internal Medicine

## 2018-10-09 LAB — BASIC METABOLIC PANEL
Anion gap: 8 (ref 5–15)
BUN: 19 mg/dL (ref 8–23)
CO2: 29 mmol/L (ref 22–32)
Calcium: 8.9 mg/dL (ref 8.9–10.3)
Chloride: 100 mmol/L (ref 98–111)
Creatinine, Ser: 0.74 mg/dL (ref 0.44–1.00)
GFR calc Af Amer: >60 mL/min (ref >60)
GFR calc non Af Amer: >60 mL/min (ref >60)
Glucose, Bld: 116 mg/dL — ABNORMAL HIGH (ref 70–99)
Potassium: 3.9 mmol/L (ref 3.5–5.1)
Sodium: 137 mmol/L (ref 135–145)

## 2018-10-09 LAB — CBC
HCT: 37.1 % (ref 36.0–46.0)
Hemoglobin: 11.9 g/dL — ABNORMAL LOW (ref 12.0–15.0)
MCH: 30.7 pg (ref 26.0–34.0)
MCHC: 32.1 g/dL (ref 30.0–36.0)
MCV: 95.6 fL (ref 80.0–100.0)
Platelets: 166 10*3/uL (ref 150–400)
RBC: 3.88 MIL/uL (ref 3.87–5.11)
RDW: 13.8 % (ref 11.5–15.5)
WBC: 5.3 10*3/uL (ref 4.0–10.5)
nRBC: 0 % (ref 0.0–0.2)

## 2018-10-09 MED ORDER — FUROSEMIDE 40 MG PO TABS: 40.0000 mg | ORAL_TABLET | Freq: Every day | ORAL | 0 refills | Status: DC

## 2018-10-09 MED ORDER — METOPROLOL TARTRATE 50 MG PO TABS: 50.0000 mg | ORAL_TABLET | Freq: Two times a day (BID) | ORAL | 0 refills | Status: DC

## 2018-10-09 MED ORDER — DICLOFENAC SODIUM 1 % TD GEL
4.0000 g | Freq: Four times a day (QID) | TRANSDERMAL | Status: DC
  Filled 2018-10-09: qty 100

## 2018-10-09 MED ORDER — APIXABAN 2.5 MG PO TABS: 2.5000 mg | ORAL_TABLET | Freq: Two times a day (BID) | ORAL | 0 refills | Status: AC

## 2018-10-09 NOTE — TOC Transition Note (Signed)
Transition of Care [facility]) - CM/SW Discharge Note   Patient Details  Name: NAUTICA FAMIGLIETTI MRN: [id number] Date of Birth: 12-10-1929  [facility] CM/SW Contact:  Virgel Manifold, RN Phone Number: 10/09/2018, 1:23 PM   Clinical Narrative:   Patient to be discharged per MD order. Orders in place for home health services. Previous RNCM began workup for heart failure protocol with [facility]. Notified Rosey Bath with [facility] of pending discharge. Signed protocol faxed to [facility]. No DME needs. Family to transport.     Final next level of care: Home w Home Health Services Barriers to Discharge: Continued Medical Work up   Patient Goals and CMS Choice Patient states their goals for this hospitalization and ongoing recovery are:: discharge to home with home health; no preference CMS Medicare.gov Compare Post Acute Care list provided to:: Patient Choice offered to / list presented to : Patient  Discharge Placement                       Discharge Plan and Services   Discharge Planning Services: CM Consult Post Acute Care Choice: Home Health              HH Arranged: RN, PT, Nurse's Aide(ReDS Vest) HH Agency: [facility]   Social Determinants of Health (SDOH) Interventions     Readmission Risk Interventions Readmission Risk Prevention Plan 10/08/2018  Post Dischage Appt Complete  Medication Screening Complete  Transportation Screening Complete  Some recent data might be hidden

## 2018-10-09 NOTE — Plan of Care (Signed)
  Problem: Education: Goal: Knowledge of General Education information will improve Description Including pain rating scale, medication(s)/side effects and non-pharmacologic comfort measures 10/09/2018 0036 by Myles Gip, RN Outcome: Progressing 10/09/2018 0036 by Myles Gip, RN Outcome: Progressing   Problem: Activity: Goal: Capacity to carry out activities will improve 10/09/2018 0036 by Myles Gip, RN Outcome: Progressing 10/09/2018 0036 by Myles Gip, RN Outcome: Progressing   Problem: Pain Managment: Goal: General experience of comfort will improve Outcome: Progressing   Problem: Safety: Goal: Ability to remain free from injury will improve 10/09/2018 0038 by Myles Gip, RN Outcome: Progressing 10/09/2018 0037 by Myles Gip, RN Outcome: Progressing 10/09/2018 0036 by Myles Gip, RN Outcome: Progressing 10/09/2018 0036 by Myles Gip, RN Outcome: Progressing

## 2018-10-09 NOTE — Discharge Summary (Signed)
[facility] [place] [facility]   PATIENT NAME: [name] [name]    MR#:  [id number]  DATE OF BIRTH:  02-23-1930  DATE OF ADMISSION:  10/07/2018   ADMITTING PHYSICIAN: Auburn Bilberry, MD  DATE OF DISCHARGE: 10/09/18  PRIMARY CARE PHYSICIAN: Tracey Harries, FNP   ADMISSION DIAGNOSIS:  Shortness of breath [R06.02] SOB (shortness of breath) [R06.02] Atrial fibrillation, unspecified type (HCC) [I48.91] Heart failure, unspecified HF chronicity, unspecified heart failure type (HCC) [I50.9] DISCHARGE DIAGNOSIS:  Active Problems:   Acute CHF (HCC)  SECONDARY DIAGNOSIS:   Past Medical History:  Diagnosis Date  . Arthritis   . Back pain   . Chicken pox   . Chronic respiratory failure (HCC)   . COPD (chronic obstructive pulmonary disease) (HCC)   . Emphysema lung (HCC)   . Glaucoma   . Insomnia   . Nocturnal leg cramps   . Osteoarthritis   . Rhabdomyolysis 04/07/2016  . Urine incontinence    HOSPITAL COURSE:   [name] [name] is an 85 year old female who presented to the ED with shortness of breath and worsening lower extremity edema.  In the ED, she was noted to have new A. Fib.  She was admitted for further management.  New onset acute diastolic CHF- improved. -Echo with EF 60 to 65% and indeterminate diastolic parameters -Treated with IV Lasix and discharged home with Lasix 40 mg p.o. daily -Started on metoprolol -Unable to start ACE due to low blood pressures.  Recommend starting as an outpatient if able. -Discharged with home health heart failure protocol  New onset atrial fibrillation- rate-controlled. -Initially treated with full dose Lovenox, then changed to Eliquis on discharge -Continued metoprolol and diltiazem -Consider referral to cardiology as an outpatient  Mild acute exacerbation of COPD- improved -Patient stable on her home 4 L O2 on the day of discharge  Glaucoma-stable -Continued eyedrops  DISCHARGE CONDITIONS:  Acute diastolic  congestive heart failure New onset atrial fibrillation Acute exacerbation of COPD Glaucoma CONSULTS OBTAINED:  Cardiology DRUG ALLERGIES:   Allergies  Allergen Reactions  . Shellfish Allergy   . Codeine     headache  . Prednisone     Altered Mental Status  . Tetracycline    DISCHARGE MEDICATIONS:   Allergies as of 10/09/2018      Reactions   Shellfish Allergy    Codeine    headache   Prednisone    Altered Mental Status   Tetracycline       Medication List    STOP taking these medications   albuterol 108 (90 Base) MCG/ACT inhaler Commonly known as:  ProAir HFA   arformoterol 15 MCG/2ML Nebu Commonly known as:  BROVANA   aspirin 325 MG tablet   bisacodyl 5 MG EC tablet Commonly known as:  DULCOLAX     TAKE these medications   apixaban 2.5 MG Tabs tablet Commonly known as:  Eliquis Take 1 tablet (2.5 mg total) by mouth 2 (two) times daily.   budesonide 0.5 MG/2ML nebulizer solution Commonly known as:  PULMICORT Inhale 0.5 mg into the lungs daily.   CENTRUM SILVER ADULT 50+ PO Take 1 tablet by mouth daily.   cholecalciferol 1000 units tablet Commonly known as:  VITAMIN D Take 1,000 Units by mouth daily.   docusate sodium 100 MG capsule Commonly known as:  Colace Take 2 capsules (200 mg total) by mouth daily.   dorzolamide 2 % ophthalmic solution Commonly known as:  TRUSOPT Place 1 drop into both eyes 3 (three)  times daily.   furosemide 40 MG tablet Commonly known as:  LASIX Take 1 tablet (40 mg total) by mouth daily.   gabapentin 100 MG capsule Commonly known as:  NEURONTIN Take 100-200 mg by mouth at bedtime as needed.   ipratropium-albuterol 0.5-2.5 (3) MG/3ML Soln Commonly known as:  DUONEB Inhale into the lungs.   latanoprost 0.005 % ophthalmic solution Commonly known as:  XALATAN Place 1 drop into both eyes at bedtime.   metoprolol tartrate 50 MG tablet Commonly known as:  LOPRESSOR Take 1 tablet (50 mg total) by mouth 2 (two)  times daily.   traZODone 50 MG tablet Commonly known as:  DESYREL Take 50 mg by mouth at bedtime.        DISCHARGE INSTRUCTIONS:  1.  Follow-up with PCP in 5 days 2.  Follow-up with pulmonology in 1 week 3.  Started on Eliquis and metoprolol for new onset A. fib 4.  Started on Lasix for new onset congestive heart failure 5.  Discharged with home health heart failure protocol DIET:  Cardiac diet DISCHARGE CONDITION:  Stable ACTIVITY:  Activity as tolerated OXYGEN:  Home Oxygen: Yes.    Oxygen Delivery: 4 liters/min via Patient connected to nasal cannula oxygen DISCHARGE LOCATION:  home   If you experience worsening of your admission symptoms, develop shortness of breath, life threatening emergency, suicidal or homicidal thoughts you must seek medical attention immediately by calling 911 or calling your MD immediately  if symptoms less severe.  You Must read complete instructions/literature along with all the possible adverse reactions/side effects for all the Medicines you take and that have been prescribed to you. Take any new Medicines after you have completely understood and accpet all the possible adverse reactions/side effects.   Please note  You were cared for by a hospitalist during your hospital stay. If you have any questions about your discharge medications or the care you received while you were in the hospital after you are discharged, you can call the unit and asked to speak with the hospitalist on call if the hospitalist that took care of you is not available. Once you are discharged, your primary care physician will handle any further medical issues. Please note that NO REFILLS for any discharge medications will be authorized once you are discharged, as it is imperative that you return to your primary care physician (or establish a relationship with a primary care physician if you do not have one) for your aftercare needs so that they can reassess your need for  medications and monitor your lab values.    On the day of Discharge:  VITAL SIGNS:  Blood pressure 109/68, pulse 80, temperature 98.6 F (37 C), temperature source Oral, resp. rate 18, height 5\' 1"  (1.549 m), weight 54 kg, SpO2 99 %. PHYSICAL EXAMINATION:  GENERAL:  85 y.o.-year-old patient lying in the bed with no acute distress.  Thin appearing. EYES: Pupils equal, round, reactive to light and accommodation. No scleral icterus. Extraocular muscles intact.  HEENT: Head atraumatic, normocephalic. Oropharynx and nasopharynx clear.  NECK:  Supple, no jugular venous distention. No thyroid enlargement, no tenderness.  LUNGS: + Diminished breath sounds in the lung bases bilaterally, no crackles or wheezing.  No use of accessory muscles of respiration.  CARDIOVASCULAR: Irregularly irregular rhythm, regular rate S1, S2 normal. No murmurs, rubs, or gallops.  ABDOMEN: Soft, non-tender, non-distended. Bowel sounds present. No organomegaly or mass.  EXTREMITIES: No pedal edema, cyanosis, or clubbing.  NEUROLOGIC: Cranial nerves II through XII  are intact. Muscle strength 5/5 in all extremities. Sensation intact. Gait not checked.  PSYCHIATRIC: The patient is alert and oriented x 3.  SKIN: No obvious rash, lesion, or ulcer.  DATA REVIEW:   CBC Recent Labs  Lab 10/09/18 0552  WBC 5.3  HGB 11.9*  HCT 37.1  PLT 166    Chemistries  Recent Labs  Lab 10/09/18 0552  NA 137  K 3.9  CL 100  CO2 29  GLUCOSE 116*  BUN 19  CREATININE 0.74  CALCIUM 8.9     Microbiology Results  Results for orders placed or performed in visit on 05/06/16  Wound culture     Status: Abnormal   Collection Time: 05/06/16 12:00 AM  Result Value Ref Range Status   Gram Stain Result Final report  Final   Organism ID, Bacteria Comment  Final    Comment: Rare white blood cells.   Organism ID, Bacteria No organisms seen  Final   Aerobic Bacterial Culture Final report (A)  Final   Organism ID, Bacteria  Enterococcus species (A)  Final    Comment: Scant growth   Antimicrobial Susceptibility Comment  Final    Comment:       ** S = Susceptible; I = Intermediate; R = Resistant **                    P = Positive; N = Negative             MICS are expressed in micrograms per mL    Antibiotic                 RSLT#1    RSLT#2    RSLT#3    RSLT#4 Penicillin                     S Vancomycin                     S     RADIOLOGY:  No results found.   Management plans discussed with the patient, family and they are in agreement.  CODE STATUS: DNR   TOTAL TIME TAKING CARE OF THIS PATIENT: 45 minutes.    Jinny Blossom Shakiara Lukic M.D on 10/09/2018 at 11:40 AM  Between 7am to 6pm - Pager - [phone]  After 6pm go to [email or web address] - Social research officer, government  [facility]  Office  [phone]  CC: Primary care physician; Tracey Harries, FNP   Note: This dictation was prepared with Dragon dictation along with smaller phrase technology. Any transcriptional errors that result from this process are unintentional.

## 2018-10-09 NOTE — Discharge Instructions (Addendum)
It was so nice to meet you during this hospitalization!  You came into the hospital with chest tightness. We found that you were in an abnormal heart rhythm called A-Fib. You also had some signs of heart failure.  I have prescribed the following medications: 1. Metoprolol- take this twice a day to help keep your heart rate down 2. Lasix- take 1 tablet daily to help keep the fluid off of your lungs- your doctor may stop this medication in the future 3. Eliquis- take 1 tablet twice a day- this is a blood thinner to help prevent clots from forming in your heart (which could potentially cause a stroke)  Take care, Dr. Nancy Marus      Shortness of Breath, Adult Shortness of breath is when a person has trouble breathing enough air or when a person feels like she or he is having trouble breathing in enough air. Shortness of breath could be a sign of a medical problem. Follow these instructions at home:   Pay attention to any changes in your symptoms.  Do not use any products that contain nicotine or tobacco, such as cigarettes, e-cigarettes, and chewing tobacco.  Do not smoke. Smoking is a common cause of shortness of breath. If you need help quitting, ask your health care provider.  Avoid things that can irritate your airways, such as: ? Mold. ? Dust. ? Air pollution. ? Chemical fumes. ? Things that can cause allergy symptoms (allergens), if you have allergies.  Keep your living space clean and free of mold and dust.  Rest as needed. Slowly return to your usual activities.  Take over-the-counter and prescription medicines only as told by your health care provider. This includes oxygen therapy and inhaled medicines.  Keep all follow-up visits as told by your health care provider. This is important. Contact a health care provider if:  Your condition does not improve as soon as expected.  You have a hard time doing your normal activities, even after you rest.  You have new  symptoms. Get help right away if:  Your shortness of breath gets worse.  You have shortness of breath when you are resting.  You feel light-headed or you faint.  You have a cough that is not controlled with medicines.  You cough up blood.  You have pain with breathing.  You have pain in your chest, arms, shoulders, or abdomen.  You have a fever.  You cannot walk up stairs or exercise the way that you normally do. These symptoms may represent a serious problem that is an emergency. Do not wait to see if the symptoms will go away. Get medical help right away. Call your local emergency services (911 in the [place] Do not drive yourself to the hospital. Summary  Shortness of breath is when a person has trouble breathing enough air. It can be a sign of a medical problem.  Avoid things that irritate your lungs, such as smoking, pollution, mold, and dust.  Pay attention to changes in your symptoms and contact your health care provider if you have a hard time completing daily activities because of shortness of breath. This information is not intended to replace advice given to you by your health care provider. Make sure you discuss any questions you have with your health care provider. Document Released: 03/11/2001 Document Revised: 11/16/2017 Document Reviewed: 11/16/2017 Elsevier Interactive Patient Education  2019 [employer].

## 2018-10-11 ENCOUNTER — Telehealth: Payer: Self-pay | Admitting: Family Medicine

## 2018-10-11 NOTE — Telephone Encounter (Signed)
First attempt for TCM ( transitional call management.) Patient discharged on 10/09/18 Dx with Acute CHF, discharged with home health will continue to call for TCM.

## 2018-10-13 NOTE — Telephone Encounter (Signed)
Not able to reach patient

## 2018-10-18 ENCOUNTER — Telehealth: Payer: Self-pay | Admitting: Family

## 2018-10-18 ENCOUNTER — Telehealth: Payer: Medicare Other | Admitting: Family

## 2018-10-18 NOTE — Telephone Encounter (Signed)
Patient did not return call regarding doing her initial HF Clinic visit virtually on 10/18/2018. Will attempt to reschedule.

## 2018-11-10 ENCOUNTER — Other Ambulatory Visit: Payer: Self-pay | Admitting: Internal Medicine

## 2019-05-31 DEATH — deceased

## 2020-03-01 ENCOUNTER — Telehealth: Payer: Self-pay | Admitting: Family Medicine

## 2020-03-01 NOTE — Telephone Encounter (Signed)
Per [facility] documentation the patient is deceased.  [facility] Source Organization  Encounter Summary     [name] [name] (Deceased)  - 86 y.o. Female; born [birth year], deceased 11-Dec-202012/11/2020Encounter Summary, generated on Sep. 02, 2021

## 2020-03-01 NOTE — Telephone Encounter (Signed)
-----   Message from Bobbye Charleston sent at 02/08/2020  1:46 PM EDT ----- Regarding: REMOVE PCP [facility]  THANK YOU
